# Patient Record
Sex: Male | Born: 1950 | Race: White | Hispanic: No | Marital: Married | Smoking: Current every day smoker
Health system: Southern US, Community
[De-identification: ages and names within clinical notes are randomized; demographics above are authoritative.]

## PROBLEM LIST (undated history)

## (undated) DIAGNOSIS — J449 Chronic obstructive pulmonary disease, unspecified: Secondary | ICD-10-CM

## (undated) DIAGNOSIS — I517 Cardiomegaly: Secondary | ICD-10-CM

## (undated) DIAGNOSIS — I10 Essential (primary) hypertension: Secondary | ICD-10-CM

## (undated) HISTORY — PX: EYE SURGERY: SHX253

---

## 2014-01-25 ENCOUNTER — Ambulatory Visit: Payer: Self-pay

## 2014-02-28 ENCOUNTER — Ambulatory Visit: Payer: Self-pay | Admitting: Ophthalmology

## 2014-04-04 ENCOUNTER — Ambulatory Visit: Payer: Self-pay | Admitting: Ophthalmology

## 2017-07-05 ENCOUNTER — Other Ambulatory Visit: Payer: Self-pay

## 2017-07-05 ENCOUNTER — Inpatient Hospital Stay: Payer: Medicare Other

## 2017-07-05 ENCOUNTER — Inpatient Hospital Stay
Admission: AD | Admit: 2017-07-05 | Discharge: 2017-07-07 | DRG: 683 | Disposition: A | Discharge status: Home / Self Care | Payer: Medicare Other | Source: Ambulatory Visit | Attending: Internal Medicine | Admitting: Internal Medicine

## 2017-07-05 DIAGNOSIS — I119 Hypertensive heart disease without heart failure: Secondary | ICD-10-CM | POA: Diagnosis present

## 2017-07-05 DIAGNOSIS — R338 Other retention of urine: Secondary | ICD-10-CM | POA: Diagnosis present

## 2017-07-05 DIAGNOSIS — N179 Acute kidney failure, unspecified: Principal | ICD-10-CM | POA: Diagnosis present

## 2017-07-05 DIAGNOSIS — Z79899 Other long term (current) drug therapy: Secondary | ICD-10-CM

## 2017-07-05 DIAGNOSIS — N401 Enlarged prostate with lower urinary tract symptoms: Secondary | ICD-10-CM | POA: Diagnosis present

## 2017-07-05 DIAGNOSIS — J441 Chronic obstructive pulmonary disease with (acute) exacerbation: Secondary | ICD-10-CM | POA: Diagnosis present

## 2017-07-05 DIAGNOSIS — F17210 Nicotine dependence, cigarettes, uncomplicated: Secondary | ICD-10-CM | POA: Diagnosis present

## 2017-07-05 HISTORY — DX: Cardiomegaly: I51.7

## 2017-07-05 HISTORY — DX: Essential (primary) hypertension: I10

## 2017-07-05 HISTORY — DX: Chronic obstructive pulmonary disease, unspecified: J44.9

## 2017-07-05 LAB — COMPREHENSIVE METABOLIC PANEL
ALK PHOS: 83 U/L (ref 38–126)
ALT: 17 U/L (ref 17–63)
ANION GAP: 13 (ref 5–15)
AST: 23 U/L (ref 15–41)
Albumin: 4 g/dL (ref 3.5–5.0)
BILIRUBIN TOTAL: 0.6 mg/dL (ref 0.3–1.2)
BUN: 62 mg/dL — ABNORMAL HIGH (ref 6–20)
CALCIUM: 8.8 mg/dL — AB (ref 8.9–10.3)
CO2: 18 mmol/L — ABNORMAL LOW (ref 22–32)
Chloride: 108 mmol/L (ref 101–111)
Creatinine, Ser: 4.74 mg/dL — ABNORMAL HIGH (ref 0.61–1.24)
GFR, EST AFRICAN AMERICAN: 13 mL/min — AB (ref >60)
GFR, EST NON AFRICAN AMERICAN: 11 mL/min — AB (ref >60)
Glucose, Bld: 129 mg/dL — ABNORMAL HIGH (ref 65–99)
POTASSIUM: 4.6 mmol/L (ref 3.5–5.1)
Sodium: 139 mmol/L (ref 135–145)
TOTAL PROTEIN: 8 g/dL (ref 6.5–8.1)

## 2017-07-05 LAB — CBC WITH DIFFERENTIAL/PLATELET
Basophils Absolute: 0 10*3/uL (ref 0–0.1)
Basophils Relative: 0 %
Eosinophils Absolute: 0 10*3/uL (ref 0–0.7)
Eosinophils Relative: 0 %
HEMATOCRIT: 36.2 % — AB (ref 40.0–52.0)
HEMOGLOBIN: 11.2 g/dL — AB (ref 13.0–18.0)
LYMPHS ABS: 0.4 10*3/uL — AB (ref 1.0–3.6)
Lymphocytes Relative: 4 %
MCH: 20.1 pg — AB (ref 26.0–34.0)
MCHC: 31 g/dL — AB (ref 32.0–36.0)
MCV: 65 fL — ABNORMAL LOW (ref 80.0–100.0)
MONO ABS: 0.3 10*3/uL (ref 0.2–1.0)
MONOS PCT: 3 %
NEUTROS ABS: 10.5 10*3/uL — AB (ref 1.4–6.5)
NEUTROS PCT: 93 %
Platelets: 316 10*3/uL (ref 150–440)
RBC: 5.56 MIL/uL (ref 4.40–5.90)
RDW: 22.3 % — AB (ref 11.5–14.5)
WBC: 11.3 10*3/uL — ABNORMAL HIGH (ref 3.8–10.6)

## 2017-07-05 LAB — INFLUENZA PANEL BY PCR (TYPE A & B)
INFLAPCR: NEGATIVE
Influenza B By PCR: NEGATIVE

## 2017-07-05 MED ORDER — ORAL CARE MOUTH RINSE: 15.0000 mL | Freq: Two times a day (BID) | OROMUCOSAL | Status: DC

## 2017-07-05 MED ORDER — NICOTINE 21 MG/24HR TD PT24
21.0000 mg | MEDICATED_PATCH | Freq: Every day | TRANSDERMAL | Status: DC
  Administered 2017-07-05 – 2017-07-07 (×3): 21 mg via TRANSDERMAL
  Filled 2017-07-05 (×3): qty 1

## 2017-07-05 MED ORDER — ORAL CARE MOUTH RINSE
15.0000 mL | Freq: Two times a day (BID) | OROMUCOSAL | Status: DC
  Administered 2017-07-05 – 2017-07-06 (×2): 15 mL via OROMUCOSAL

## 2017-07-05 MED ORDER — ACETAMINOPHEN 325 MG PO TABS: 650.0000 mg | ORAL_TABLET | Freq: Four times a day (QID) | ORAL | Status: DC | PRN

## 2017-07-05 MED ORDER — SODIUM CHLORIDE 0.9% FLUSH: 3.0000 mL | INTRAVENOUS | Status: DC | PRN

## 2017-07-05 MED ORDER — IPRATROPIUM-ALBUTEROL 0.5-2.5 (3) MG/3ML IN SOLN
3.0000 mL | RESPIRATORY_TRACT | Status: DC
  Administered 2017-07-05 – 2017-07-07 (×9): 3 mL via RESPIRATORY_TRACT
  Filled 2017-07-05 (×11): qty 3

## 2017-07-05 MED ORDER — BUDESONIDE 0.25 MG/2ML IN SUSP
0.2500 mg | Freq: Two times a day (BID) | RESPIRATORY_TRACT | Status: DC
  Administered 2017-07-05 – 2017-07-06 (×2): 0.25 mg via RESPIRATORY_TRACT
  Filled 2017-07-05 (×4): qty 2

## 2017-07-05 MED ORDER — SODIUM CHLORIDE 0.9% FLUSH
3.0000 mL | Freq: Two times a day (BID) | INTRAVENOUS | Status: DC
  Administered 2017-07-05 – 2017-07-07 (×4): 3 mL via INTRAVENOUS

## 2017-07-05 MED ORDER — TIOTROPIUM BROMIDE MONOHYDRATE 18 MCG IN CAPS
18.0000 ug | ORAL_CAPSULE | Freq: Every day | RESPIRATORY_TRACT | Status: DC
  Administered 2017-07-06: 18 ug via RESPIRATORY_TRACT
  Filled 2017-07-05: qty 5

## 2017-07-05 MED ORDER — GUAIFENESIN ER 600 MG PO TB12
600.0000 mg | ORAL_TABLET | Freq: Two times a day (BID) | ORAL | Status: DC
  Administered 2017-07-05 – 2017-07-07 (×4): 600 mg via ORAL
  Filled 2017-07-05 (×4): qty 1

## 2017-07-05 MED ORDER — HEPARIN SODIUM (PORCINE) 5000 UNIT/ML IJ SOLN
5000.0000 [IU] | Freq: Three times a day (TID) | INTRAMUSCULAR | Status: DC
  Administered 2017-07-05 – 2017-07-07 (×5): 5000 [IU] via SUBCUTANEOUS
  Filled 2017-07-05 (×5): qty 1

## 2017-07-05 MED ORDER — SODIUM CHLORIDE 0.9 % IV SOLN: 250.0000 mL | INTRAVENOUS | Status: DC | PRN

## 2017-07-05 MED ORDER — ACETAMINOPHEN 650 MG RE SUPP: 650.0000 mg | Freq: Four times a day (QID) | RECTAL | Status: DC | PRN

## 2017-07-05 MED ORDER — METHYLPREDNISOLONE SODIUM SUCC 125 MG IJ SOLR
60.0000 mg | Freq: Four times a day (QID) | INTRAMUSCULAR | Status: DC
  Administered 2017-07-05 – 2017-07-07 (×7): 60 mg via INTRAVENOUS
  Filled 2017-07-05 (×7): qty 2

## 2017-07-05 MED ORDER — TAMSULOSIN HCL 0.4 MG PO CAPS
0.4000 mg | ORAL_CAPSULE | Freq: Every day | ORAL | Status: DC
  Administered 2017-07-06 – 2017-07-07 (×2): 0.4 mg via ORAL
  Filled 2017-07-05 (×2): qty 1

## 2017-07-05 NOTE — H&P (Signed)
[place] [facility] [place] NAME: Christopher Mendez    MR#:  [id number]  DATE OF BIRTH:  1950/10/06  DATE OF ADMISSION:  07/05/2017  PRIMARY CARE PHYSICIAN: [facility] [place]   REQUESTING/REFERRING PHYSICIAN: [facility]  CHIEF COMPLAINT:  No chief complaint on file.   HISTORY OF PRESENT ILLNESS: Christopher Mendez  is a 67 y.o. male with a known history of COPD, essential hypertension who has had trouble with voiding for the past 4 weeks.  According to his wife he has been having to use diapers at nighttime with leakage.  Patient was seen at [facility] and had blood work done which showed that his creatinine is significantly elevated.  And is noted to have acute renal failure.  Therefore his referred to admission.  Patient also complains of abdominal distention and lower extremity swelling. He also has a history of COPD and has been having progressive shortness of breath wheezing coughing.  Denies any fevers or chills.  PAST MEDICAL HISTORY:   Past Medical History:  Diagnosis Date  . Cardiac enlargement   . COPD (chronic obstructive pulmonary disease) ([place])   . HTN (hypertension)     PAST SURGICAL HISTORY: Reviewed denies any surgical history  SOCIAL HISTORY:  Social History   Tobacco Use  . Smoking status: Current Every Day Smoker    Packs/day: 1.00    Years: 56.00    Pack years: 56.00    Types: Cigarettes  . Smokeless tobacco: Never Used  . Tobacco comment: smoking since he was 10  Substance Use Topics  . Alcohol use: Yes    Alcohol/week: 25.2 oz    Types: 42 Cans of beer per week    Frequency: Never    Comment:  stopped drinking 5 weeks ago    FAMILY HISTORY:  Family History  Problem Relation Age of Onset  . Hypertension Mother   . Diabetes Father     DRUG ALLERGIES: Allergies not on file  REVIEW OF SYSTEMS:   CONSTITUTIONAL: No fever, fatigue or weakness.  EYES: No blurred or double  vision.  EARS, NOSE, AND THROAT: No tinnitus or ear pain.  RESPIRATORY: No cough, shortness of breath, wheezing or hemoptysis.  CARDIOVASCULAR: No chest pain, orthopnea, positive edema.  GASTROINTESTINAL: No nausea, vomiting, diarrhea or positive abdominal distention GENITOURINARY: No dysuria, hematuria.  ENDOCRINE: No polyuria, nocturia,  HEMATOLOGY: No anemia, easy bruising or bleeding SKIN: No rash or lesion. MUSCULOSKELETAL: No joint pain or arthritis.   NEUROLOGIC: No tingling, numbness, weakness.  PSYCHIATRY: No anxiety or depression.   MEDICATIONS AT HOME:  Prior to Admission medications   Medication Sig Start Date End Date Taking? Authorizing Provider  albuterol (PROVENTIL HFA;VENTOLIN HFA) 108 (90 Base) MCG/ACT inhaler Inhale 1 puff into the lungs every 6 (six) hours as needed for wheezing or shortness of breath.   Yes [provider]  azithromycin (ZITHROMAX) 250 MG tablet Take 250 mg by mouth daily. For 5 days 07/02/17  Yes [provider]  Fluticasone-Salmeterol (ADVAIR) 500-50 MCG/DOSE AEPB Inhale 1 puff into the lungs 2 (two) times daily.   Yes [provider]  hydrochlorothiazide (HYDRODIURIL) 25 MG tablet Take 25 mg by mouth daily.   Yes [provider]  lisinopril (PRINIVIL,ZESTRIL) 40 MG tablet Take 40 mg by mouth daily.   Yes [provider]  predniSONE (DELTASONE) 10 MG tablet Take 40 mg by mouth daily with breakfast. For 5 days 07/02/17  Yes [provider]  tamsulosin (FLOMAX) 0.4 MG CAPS capsule Take 0.4 mg by mouth daily.   Yes [provider]  tiotropium (SPIRIVA) 18 MCG inhalation capsule Place 18 mcg into inhaler and inhale daily.   Yes [provider]  varenicline (CHANTIX PAK) 0.5 MG X 11 & 1 MG X 42 tablet Take 1 mg by mouth 2 (two) times daily. Take one 0.5 mg tablet by mouth once daily for 3 days, then increase to one 0.5 mg tablet twice daily for 4 days, then increase to one 1 mg tablet twice  daily.   Yes [provider]      PHYSICAL EXAMINATION:   VITAL SIGNS: Blood pressure (!) 146/68, pulse 97, temperature 97.9 F (36.6 C), resp. rate 18, height 6' (1.829 m), weight 247 lb 2.2 oz (112.1 kg), SpO2 93 %.  GENERAL:  67 y.o.-year-old patient lying in the bed with no acute distress.  EYES: Pupils equal, round, reactive to light and accommodation. No scleral icterus. Extraocular muscles intact.  HEENT: Head atraumatic, normocephalic. Oropharynx and nasopharynx clear.  NECK:  Supple, no jugular venous distention. No thyroid enlargement, no tenderness.  LUNGS: Bilateral wheezing throughout the lung as well as crackles CARDIOVASCULAR: S1, S2 normal. No murmurs, rubs, or gallops.  ABDOMEN: Soft, nontender, positive the distended her abdomen. Bowel sounds present. No organomegaly or mass.  EXTREMITIES: 2+ pedal edema, cyanosis, or clubbing.  NEUROLOGIC: Cranial nerves II through XII are intact. Muscle strength 5/5 in all extremities. Sensation intact. Gait not checked.  PSYCHIATRIC: The patient is alert and oriented x 3.  SKIN: No obvious rash, lesion, or ulcer.   LABORATORY PANEL:   CBC Recent Labs  Lab 07/05/17 1332  WBC 11.3*  HGB 11.2*  HCT 36.2*  PLT 316  MCV 65.0*  MCH 20.1*  MCHC 31.0*  RDW 22.3*  LYMPHSABS 0.4*  MONOABS 0.3  EOSABS 0.0  BASOSABS 0.0   ------------------------------------------------------------------------------------------------------------------  Chemistries  Recent Labs  Lab 07/05/17 1332  NA 139  K 4.6  CL 108  CO2 18*  GLUCOSE 129*  BUN 62*  CREATININE 4.74*  CALCIUM 8.8*  AST 23  ALT 17  ALKPHOS 83  BILITOT 0.6   ------------------------------------------------------------------------------------------------------------------ estimated creatinine clearance is 19.3 mL/min (A) (by C-G formula based on SCr of 4.74 mg/dL  (H)). ------------------------------------------------------------------------------------------------------------------ No results for input(s): TSH, T4TOTAL, T3FREE, THYROIDAB in the last 72 hours.  Invalid input(s): FREET3   Coagulation profile No results for input(s): INR, PROTIME in the last 168 hours. ------------------------------------------------------------------------------------------------------------------- No results for input(s): DDIMER in the last 72 hours. -------------------------------------------------------------------------------------------------------------------  Cardiac Enzymes No results for input(s): CKMB, TROPONINI, MYOGLOBIN in the last 168 hours.  Invalid input(s): CK ------------------------------------------------------------------------------------------------------------------ Invalid input(s): POCBNP  ---------------------------------------------------------------------------------------------------------------  Urinalysis No results found for: COLORURINE, APPEARANCEUR, LABSPEC, PHURINE, GLUCOSEU, HGBUR, BILIRUBINUR, KETONESUR, PROTEINUR, UROBILINOGEN, NITRITE, LEUKOCYTESUR   RADIOLOGY: Dg Chest 2 View  Result Date: 07/05/2017 CLINICAL DATA:  Weakness EXAM: CHEST  2 VIEW COMPARISON:  01/25/2014 FINDINGS: Cardiac shadow is enlarged. Mild vascular congestion is noted without significant interstitial edema. No focal infiltrate or sizable effusion is noted. Hyperinflation consistent with COPD is seen. IMPRESSION: Mild vascular congestion COPD. Electronically Signed   By: Inez Catalina M.D.   On: 07/05/2017 14:52   Dg Abd 1 View  Result Date: 07/05/2017 CLINICAL DATA:  Weakness EXAM: ABDOMEN - 1 VIEW COMPARISON:  None. FINDINGS: Scattered large and small bowel gas is noted. No obstructive changes are seen. Fullness in the bladder is again identified. No acute bony abnormality  is seen. Degenerative changes of lumbar spine are noted. IMPRESSION: Distended  bladder. No other focal abnormality is noted. Electronically Signed   By: Inez Catalina M.D.   On: 07/05/2017 14:55   US Renal  Result Date: 07/05/2017 CLINICAL DATA:  Acute renal failure EXAM: RENAL / URINARY TRACT ULTRASOUND COMPLETE COMPARISON:  None. FINDINGS: Right Kidney: Length: 13 cm. Moderate hydronephrosis is noted. 3.4 cm cyst is noted in the upper pole. Left Kidney: Length: 11.8 cm. Moderate hydronephrosis is noted similar to that seen on the right. Bladder: Bladder is well distended. Prostate is mildly prominent. Prevoid bladder volume was 1315 mL and the postvoid volume is 1276 mL IMPRESSION: Moderate hydronephrosis bilaterally. This may be related to significantly distended bladder. Voiding was attempted and only a minimal amount of urine was passed. This raises suspicion for chronic outlet obstruction. Electronically Signed   By: Inez Catalina M.D.   On: 07/05/2017 14:30    EKG: No orders found for this or any previous visit.  IMPRESSION AND PLAN: Patient is a 67 year old white male sent from primary care's office for acute renal  1.  Acute renal failure due to urinary retention likely result of enlarged prostate I will Place Foley Patient had a PSA that was elevated as outpatient I will ask urology to see Nephrology evaluation as well  2.  Acute on chronic COPD exasperation We will treat him with nebulizer steroids Check the flu test Continue Spiriva Start him on Symbicort nebs  3.  Essential hypertension due to acute renal failure will hold his HCTZ and lisinopril  4.  Nicotine abuse smoking cessation provided 4 minutes spent strongly recommend he stop smoking nicotine patch will be started   All the records are reviewed and case discussed with ED provider. Management plans discussed with the patient, family and they are in agreement.  CODE STATUS:    Code Status Orders  (From admission, onward)        Start     Ordered   07/05/17 1340  Full code  Continuous      07/05/17 1340    Code Status History    Date Active Date Inactive Code Status Order ID Comments User Context   This patient has a current code status but no historical code status.       TOTAL TIME TAKING CARE OF THIS PATIENT: 49minutes.    Dustin Flock M.D on 07/05/2017 at 4:22 PM  Between 7am to 6pm - Pager - [phone]  After 6pm go to [email or web address] - password [email or web address] [facility]  [place] [facility]  Office  [phone]  CC: Primary care physician; [facility]

## 2017-07-05 NOTE — Consult Note (Signed)
Urology Consult  Consulting OE:[id number] Posey Pronto  CC: Renal failure, urinary retention  HPI: This is a 67year old male without significant urologic history admitted earlier today with acute renal insufficiency.  The patient was found to have bilateral hydronephrosis on ultrasound.  Additionally, he was found to have a significantly distended bladder large residual volume.  The patient does note a long-standing history of urinary difficulty frequency, urgency, slow stream, incomplete emptying, urinary incontinence necessitating diapers for several months.  He has not had urinary tract infections.  He has never had his prostate checked that he knows of.  There is no family history of prostate cancer.  Creatinine on admission was 4.7, bicarbonate 18, BUN 62, potassium 4.6.  Foley catheter placed.    PMH: Past Medical History:  Diagnosis Date  . Cardiac enlargement   . COPD (chronic obstructive pulmonary disease) ([place])   . HTN (hypertension)     PSH:   Allergies: No Known Allergies  Medications: Medications Prior to Admission  Medication Sig Dispense Refill Last Dose  . albuterol (PROVENTIL HFA;VENTOLIN HFA) 108 (90 Base) MCG/ACT inhaler Inhale 1 puff into the lungs every 6 (six) hours as needed for wheezing or shortness of breath.   07/05/2017 at Unknown time  . azithromycin (ZITHROMAX) 250 MG tablet Take 250 mg by mouth daily. For 5 days   07/05/2017 at Unknown time  . Fluticasone-Salmeterol (ADVAIR) 500-50 MCG/DOSE AEPB Inhale 1 puff into the lungs 2 (two) times daily.   07/05/2017 at Unknown time  . hydrochlorothiazide (HYDRODIURIL) 25 MG tablet Take 25 mg by mouth daily.   07/05/2017 at Unknown time  . lisinopril (PRINIVIL,ZESTRIL) 40 MG tablet Take 40 mg by mouth daily.   07/05/2017 at Unknown time  . predniSONE (DELTASONE) 10 MG tablet Take 40 mg by mouth daily with breakfast. For 5 days   07/05/2017 at Unknown time  . tamsulosin (FLOMAX) 0.4 MG CAPS capsule Take 0.4 mg by mouth daily.    07/05/2017 at Unknown time  . tiotropium (SPIRIVA) 18 MCG inhalation capsule Place 18 mcg into inhaler and inhale daily.   07/05/2017 at Unknown time     Social History: Social History   Socioeconomic History  . Marital status: Married    Spouse name: Not on file  . Number of children: Not on file  . Years of education: Not on file  . Highest education level: Not on file  Social Needs  . Financial resource strain: Not on file  . Food insecurity - worry: Not on file  . Food insecurity - inability: Not on file  . Transportation needs - medical: Not on file  . Transportation needs - non-medical: Not on file  Occupational History  . Not on file  Tobacco Use  . Smoking status: Current Every Day Smoker    Packs/day: 1.00    Years: 56.00    Pack years: 56.00    Types: Cigarettes  . Smokeless tobacco: Never Used  . Tobacco comment: smoking since he was 10  Substance and Sexual Activity  . Alcohol use: Yes    Alcohol/week: 25.2 oz    Types: 42 Cans of beer per week    Frequency: Never    Comment:  stopped drinking 5 weeks ago  . Drug use: No  . Sexual activity: Not Currently  Other Topics Concern  . Not on file  Social History Narrative  . Not on file    Family History: Family History  Problem Relation Age of Onset  . Hypertension Mother   .  Diabetes Father     Review of Systems: Positive: Urinary frequency, urgency, incontinence, slow stream, hesitancy, intermittency, incomplete emptying, edema Negative:   A further 10 point review of systems was negative except what is listed in the HPI.  Physical Exam: @VITALS2 @ General: No acute distress.  Awake. Head:  Normocephalic.  Atraumatic. ENT:  EOMI.  Mucous membranes moist Neck:  Supple.  No lymphadenopathy. CV:  S1 present. S2 present. Regular rate. Pulmonary: Equal effort bilaterally.  Clear to auscultation bilaterally. Abdomen: Soft, obese none.  Non tender to palpation. Skin:  Normal turgor.  No visible  rash. Extremity: No gross deformity of bilateral upper extremities.  No gross deformity of is bilateral lower extremities. Neurologic: Alert. Appropriate mood.  Penis:  No lesions. Urethra:  Foley catheter in place.  Orthotopic meatus. Scrotum: No lesions.  No ecchymosis.  No erythema. Testicles: Descended bilaterally.  No masses bilaterally. Epididymis: Palpable bilaterally.  Non Tender to palpation. Rectal:             Somewhat difficult due to patient positioning, but prostate is very large, a bit asymmetric and to me, firm.  Studies:  Recent Labs    07/05/17 1332  HGB 11.2*  WBC 11.3*  PLT 316    Recent Labs    07/05/17 1332  NA 139  K 4.6  CL 108  CO2 18*  BUN 62*  CREATININE 4.74*  CALCIUM 8.8*  GFRNONAA 11*  GFRAA 13*     No results for input(s): INR, APTT in the last 72 hours.  Invalid input(s): PT   Invalid input(s): ABG  Renal ultrasound images were independently reviewed by me.  There is a solitary renal cyst, simple in nature.  Mild to moderate bilateral hydroureter process.  Large bladder volume  Assessment: 1.  Bladder outlet obstruction with significant symptomatology, currently treated with Foley catheter placement 2.  Acute renal failure/bilateral hydronephrosis, most likely related to problem #1 3.  Large, asymmetric, prostate exam. Plan: 1.  I will start the patient on tamsulosin eventual voiding trial as an outpatient 2.  The patient will eventually need PSA/repeat digital rectal exam as an outpatient.  PSA while in the hospital, as he has had recent Foley catheter placement and urinary retention, both   which can cause elevated PSA 3. We will arrange post d/c followup for above    Pager:[phone]

## 2017-07-05 NOTE — Progress Notes (Signed)
Chaplain spoke to the patient's wife and provided AD education. Due to the patient's visitor precautions it may not be possible to complete the AD at this time.

## 2017-07-05 NOTE — Progress Notes (Signed)
Dr. Posey Pronto gave order to bladder scan patient and if there was more than 350cc to insert foley.  I did the bladder scan which was >999.  We inserted the foley and got out 1400cc.

## 2017-07-06 ENCOUNTER — Telehealth: Payer: Self-pay | Admitting: Urology

## 2017-07-06 LAB — BASIC METABOLIC PANEL
ANION GAP: 9 (ref 5–15)
BUN: 44 mg/dL — ABNORMAL HIGH (ref 6–20)
CHLORIDE: 109 mmol/L (ref 101–111)
CO2: 22 mmol/L (ref 22–32)
Calcium: 9.1 mg/dL (ref 8.9–10.3)
Creatinine, Ser: 1.86 mg/dL — ABNORMAL HIGH (ref 0.61–1.24)
GFR calc non Af Amer: 35 mL/min — ABNORMAL LOW (ref >60)
GFR, EST AFRICAN AMERICAN: 41 mL/min — AB (ref >60)
Glucose, Bld: 158 mg/dL — ABNORMAL HIGH (ref 65–99)
Potassium: 4.2 mmol/L (ref 3.5–5.1)
SODIUM: 140 mmol/L (ref 135–145)

## 2017-07-06 LAB — CBC
HEMATOCRIT: 35.9 % — AB (ref 40.0–52.0)
HEMOGLOBIN: 11.2 g/dL — AB (ref 13.0–18.0)
MCH: 20.3 pg — ABNORMAL LOW (ref 26.0–34.0)
MCHC: 31.2 g/dL — ABNORMAL LOW (ref 32.0–36.0)
MCV: 65.1 fL — ABNORMAL LOW (ref 80.0–100.0)
Platelets: 326 10*3/uL (ref 150–440)
RBC: 5.51 MIL/uL (ref 4.40–5.90)
RDW: 21.9 % — ABNORMAL HIGH (ref 11.5–14.5)
WBC: 5.2 10*3/uL (ref 3.8–10.6)

## 2017-07-06 MED ORDER — SODIUM CHLORIDE 0.9 % IV SOLN
INTRAVENOUS | Status: DC
  Administered 2017-07-06 – 2017-07-07 (×2): via INTRAVENOUS

## 2017-07-06 NOTE — Telephone Encounter (Signed)
App has been made ° ° °Michelle °

## 2017-07-06 NOTE — Consult Note (Signed)
[facility] CONSULT NOTE    Date: 07/06/2017                  Patient Name:  Shandell Jallow  MRN: [id number]  DOB: Oct 09, 1950  Age / Sex: 67 y.o., male         PCP: [facility] [place]                 Service Requesting Consult: Hospitalist                 Reason for Consult: Acute renal failure due to obstructive uropathy            History of Present Illness: Patient is a 67 y.o. male with a PMHx of COPD and hypertension, who was admitted to [facility] on 07/05/2017 for evaluation of significant dysuria and urinary retention.  Patient has been having progressive dysuria over the past several months.  He has not seen a provider for these symptoms previously.  Apparently patient had been using diapers at home with significant leakage.  He went to see his primary care provider subsequently and he was found to have acute renal failure and subsequent we sent here.  He had significant lower abdominal distention upon arrival.  Foley catheter was placed.  Patient had overt 5 L of urine output yesterday.  Creatinine is now down to1.86.  Patient is feeling much better after the Foley catheter placement.   Medications: Outpatient medications: Medications Prior to Admission  Medication Sig Dispense Refill Last Dose  . albuterol (PROVENTIL HFA;VENTOLIN HFA) 108 (90 Base) MCG/ACT inhaler Inhale 1 puff into the lungs every 6 (six) hours as needed for wheezing or shortness of breath.   07/05/2017 at Unknown time  . azithromycin (ZITHROMAX) 250 MG tablet Take 250 mg by mouth daily. For 5 days   07/05/2017 at Unknown time  . Fluticasone-Salmeterol (ADVAIR) 500-50 MCG/DOSE AEPB Inhale 1 puff into the lungs 2 (two) times daily.   07/05/2017 at Unknown time  . hydrochlorothiazide (HYDRODIURIL) 25 MG tablet Take 25 mg by mouth daily.   07/05/2017 at Unknown time  . lisinopril (PRINIVIL,ZESTRIL) 40 MG tablet Take 40 mg by mouth daily.   07/05/2017 at Unknown time  . predniSONE  (DELTASONE) 10 MG tablet Take 40 mg by mouth daily with breakfast. For 5 days   07/05/2017 at Unknown time  . tamsulosin (FLOMAX) 0.4 MG CAPS capsule Take 0.4 mg by mouth daily.   07/05/2017 at Unknown time  . tiotropium (SPIRIVA) 18 MCG inhalation capsule Place 18 mcg into inhaler and inhale daily.   07/05/2017 at Unknown time    Current medications: Current Facility-Administered Medications  Medication Dose Route Frequency Provider Last Rate Last Dose  . 0.9 %  sodium chloride infusion  250 mL Intravenous PRN Dustin Flock, MD      . acetaminophen (TYLENOL) tablet 650 mg  650 mg Oral Q6H PRN Dustin Flock, MD       Or  . acetaminophen (TYLENOL) suppository 650 mg  650 mg Rectal Q6H PRN Dustin Flock, MD      . budesonide (PULMICORT) nebulizer solution 0.25 mg  0.25 mg Nebulization BID Dustin Flock, MD   0.25 mg at 07/05/17 2005  . guaiFENesin (MUCINEX) 12 hr tablet 600 mg  600 mg Oral BID Dustin Flock, MD   600 mg at 07/06/17 0840  . heparin injection 5,000 Units  5,000 Units Subcutaneous Q8H Dustin Flock, MD   5,000 Units at 07/06/17 [phone]  .  ipratropium-albuterol (DUONEB) 0.5-2.5 (3) MG/3ML nebulizer solution 3 mL  3 mL Nebulization Q4H Dustin Flock, MD   3 mL at 07/06/17 1113  . MEDLINE mouth rinse  15 mL Mouth Rinse BID Henreitta Leber, MD   15 mL at 07/06/17 0930  . methylPREDNISolone sodium succinate (SOLU-MEDROL) 125 mg/2 mL injection 60 mg  60 mg Intravenous Q6H Dustin Flock, MD   60 mg at 07/06/17 1127  . nicotine (NICODERM CQ - dosed in mg/24 hours) patch 21 mg  21 mg Transdermal Daily Dustin Flock, MD   21 mg at 07/06/17 0842  . sodium chloride flush (NS) 0.9 % injection 3 mL  3 mL Intravenous Q12H Dustin Flock, MD   3 mL at 07/06/17 0846  . sodium chloride flush (NS) 0.9 % injection 3 mL  3 mL Intravenous PRN Dustin Flock, MD      . tamsulosin [facility]) capsule 0.4 mg  0.4 mg Oral Daily Dustin Flock, MD   0.4 mg at 07/06/17 6063      Allergies: No  Known Allergies    Past Medical History: Past Medical History:  Diagnosis Date  . Cardiac enlargement   . COPD (chronic obstructive pulmonary disease) ([place])   . HTN (hypertension)      Past Surgical History: None  Family History: Family History  Problem Relation Age of Onset  . Hypertension Mother   . Diabetes Father      Social History: Social History   Socioeconomic History  . Marital status: Married    Spouse name: Not on file  . Number of children: Not on file  . Years of education: Not on file  . Highest education level: Not on file  Social Needs  . Financial resource strain: Not on file  . Food insecurity - worry: Not on file  . Food insecurity - inability: Not on file  . Transportation needs - medical: Not on file  . Transportation needs - non-medical: Not on file  Occupational History  . Not on file  Tobacco Use  . Smoking status: Current Every Day Smoker    Packs/day: 1.00    Years: 56.00    Pack years: 56.00    Types: Cigarettes  . Smokeless tobacco: Never Used  . Tobacco comment: smoking since he was 10  Substance and Sexual Activity  . Alcohol use: Yes    Alcohol/week: 25.2 oz    Types: 42 Cans of beer per week    Frequency: Never    Comment:  stopped drinking 5 weeks ago  . Drug use: No  . Sexual activity: Not Currently  Other Topics Concern  . Not on file  Social History Narrative  . Not on file     Review of Systems: Review of Systems  Constitutional: Positive for malaise/fatigue. Negative for chills and fever.  HENT: Negative for congestion, hearing loss and nosebleeds.   Eyes: Negative for blurred vision and double vision.  Respiratory: Negative for cough, hemoptysis and sputum production.   Cardiovascular: Negative for chest pain, palpitations and orthopnea.  Gastrointestinal: Positive for abdominal pain. Negative for heartburn and vomiting.  Genitourinary: Positive for dysuria and flank pain.  Musculoskeletal: Negative for  back pain and myalgias.  Skin: Negative for itching and rash.  Neurological: Negative for dizziness and focal weakness.  Endo/Heme/Allergies: Negative for polydipsia. Does not bruise/bleed easily.  Psychiatric/Behavioral: Negative for depression and hallucinations.     Vital Signs: Blood pressure (!) 150/64, pulse 83, temperature (!) 97.4 F (36.3 C), temperature source  Oral, resp. rate 20, height 6' (1.829 m), weight 112.1 kg (247 lb 2.2 oz), SpO2 92 %.  Weight trends: Filed Weights   07/05/17 1206  Weight: 112.1 kg (247 lb 2.2 oz)    Physical Exam: General: NAD, resting in bed  Head: Normocephalic, atraumatic.  Eyes: Anicteric, EOMI  Nose: Mucous membranes moist, not inflammed, nonerythematous.  Throat: Oropharynx nonerythematous, no exudate appreciated.   Neck: Supple, trachea midline.  Lungs:  Normal respiratory effort. Clear to auscultation BL without crackles or wheezes.  Heart: RRR. S1 and S2 normal without gallop, murmur, or rubs.  Abdomen:  BS normoactive. Soft, Nondistended, non-tender.  No masses or organomegaly.  Extremities: No pretibial edema.  Neurologic: A&O X3, Motor strength is 5/5 in the all 4 extremities  Skin: No visible rashes, scars.    Lab results: Basic Metabolic Panel: Recent Labs  Lab 07/05/17 1332 07/06/17 0544  NA 139 140  K 4.6 4.2  CL 108 109  CO2 18* 22  GLUCOSE 129* 158*  BUN 62* 44*  CREATININE 4.74* 1.86*  CALCIUM 8.8* 9.1    Liver Function Tests: Recent Labs  Lab 07/05/17 1332  AST 23  ALT 17  ALKPHOS 83  BILITOT 0.6  PROT 8.0  ALBUMIN 4.0   No results for input(s): LIPASE, AMYLASE in the last 168 hours. No results for input(s): AMMONIA in the last 168 hours.  CBC: Recent Labs  Lab 07/05/17 1332 07/06/17 0544  WBC 11.3* 5.2  NEUTROABS 10.5*  --   HGB 11.2* 11.2*  HCT 36.2* 35.9*  MCV 65.0* 65.1*  PLT 316 326    Cardiac Enzymes: No results for input(s): CKTOTAL, CKMB, CKMBINDEX, TROPONINI in the last 168  hours.  BNP: Invalid input(s): POCBNP  CBG: No results for input(s): GLUCAP in the last 168 hours.  Microbiology: No results found for this or any previous visit.  Coagulation Studies: No results for input(s): LABPROT, INR in the last 72 hours.  Urinalysis: No results for input(s): COLORURINE, LABSPEC, PHURINE, GLUCOSEU, HGBUR, BILIRUBINUR, KETONESUR, PROTEINUR, UROBILINOGEN, NITRITE, LEUKOCYTESUR in the last 72 hours.  Invalid input(s): APPERANCEUR    Imaging: Dg Chest 2 View  Result Date: 07/05/2017 CLINICAL DATA:  Weakness EXAM: CHEST  2 VIEW COMPARISON:  01/25/2014 FINDINGS: Cardiac shadow is enlarged. Mild vascular congestion is noted without significant interstitial edema. No focal infiltrate or sizable effusion is noted. Hyperinflation consistent with COPD is seen. IMPRESSION: Mild vascular congestion COPD. Electronically Signed   By: Inez Catalina M.D.   On: 07/05/2017 14:52   Dg Abd 1 View  Result Date: 07/05/2017 CLINICAL DATA:  Weakness EXAM: ABDOMEN - 1 VIEW COMPARISON:  None. FINDINGS: Scattered large and small bowel gas is noted. No obstructive changes are seen. Fullness in the bladder is again identified. No acute bony abnormality is seen. Degenerative changes of lumbar spine are noted. IMPRESSION: Distended bladder. No other focal abnormality is noted. Electronically Signed   By: Inez Catalina M.D.   On: 07/05/2017 14:55   US Renal  Result Date: 07/05/2017 CLINICAL DATA:  Acute renal failure EXAM: RENAL / URINARY TRACT ULTRASOUND COMPLETE COMPARISON:  None. FINDINGS: Right Kidney: Length: 13 cm. Moderate hydronephrosis is noted. 3.4 cm cyst is noted in the upper pole. Left Kidney: Length: 11.8 cm. Moderate hydronephrosis is noted similar to that seen on the right. Bladder: Bladder is well distended. Prostate is mildly prominent. Prevoid bladder volume was 1315 mL and the postvoid volume is 1276 mL IMPRESSION: Moderate hydronephrosis bilaterally. This may be related  to  significantly distended bladder. Voiding was attempted and only a minimal amount of urine was passed. This raises suspicion for chronic outlet obstruction. Electronically Signed   By: Inez Catalina M.D.   On: 07/05/2017 14:30      Assessment & Plan: Pt is a 67 y.o. male  with a PMHx of COPD and hypertension, who was admitted to [facility] on 07/05/2017 for evaluation of significant dysuria and urinary retention.   1.  Acute renal failure due to bilateral hydronephrosis. 2.  Urinary retention due to bladder outlet obstruction. 3.  Hypertension.  Plan:  Patient presented with significant dysuria.  He was found to have bladder outlet obstruction.  After Foley catheter placement.  5 L of urine output.  His creatinine has significantly improved and is down to 1.86.  Given the high amount of diuresis we will start the patient on 0.9 normal saline at 100 cc per hour.  Continue to monitor renal parameters daily.  He will need additional followup through urology.  Keep Foley catheter in place at this time.

## 2017-07-06 NOTE — Telephone Encounter (Signed)
Would you see if this patient has outpatient follow up when he is discharged from the hospital for a voiding trial and will additional follow up after that for DRE/repeat PSA exam?

## 2017-07-06 NOTE — Progress Notes (Addendum)
[place] [facility] [place] NAME: Christopher Mendez    MR#:  [id number]  DATE OF BIRTH:  Oct 22, 1950  SUBJECTIVE:  CHIEF COMPLAINT:  No chief complaint on file.  -  Came in with urinary retention and renal failure. Foley has been placed. Renal Function is improving. -Still has significant wheezing and shortness of breath  REVIEW OF SYSTEMS:  Review of Systems  Constitutional: Positive for malaise/fatigue. Negative for chills and fever.  HENT: Negative for congestion, ear discharge, hearing loss and nosebleeds.   Eyes: Negative for blurred vision and double vision.  Respiratory: Positive for shortness of breath and wheezing. Negative for cough.   Cardiovascular: Negative for chest pain, palpitations and leg swelling.  Gastrointestinal: Negative for abdominal pain, constipation, diarrhea, nausea and vomiting.  Genitourinary: Negative for dysuria and urgency.  Musculoskeletal: Negative for myalgias.  Neurological: Negative for dizziness, speech change, focal weakness, seizures and headaches.  Psychiatric/Behavioral: Negative for depression.    DRUG ALLERGIES:  No Known Allergies  VITALS:  Blood pressure (!) 152/69, pulse 83, temperature 98.4 F (36.9 C), temperature source Oral, resp. rate 20, height 6' (1.829 m), weight 112.1 kg (247 lb 2.2 oz), SpO2 93 %.  PHYSICAL EXAMINATION:  Physical Exam   GENERAL:  67 y.o.-year-old patient lying in the bed with no acute distress.  EYES: Pupils equal, round, reactive to light and accommodation. No scleral icterus. Extraocular muscles intact.  HEENT: Head atraumatic, normocephalic. Oropharynx and nasopharynx clear.  NECK:  Supple, no jugular venous distention. No thyroid enlargement, no tenderness.  LUNGS:  Diffuse wheezing all over the lung fields, no crackles, rales or rhonchi. No use of accessory muscles to breathe CARDIOVASCULAR: S1, S2 normal. No murmurs, rubs, or gallops.  ABDOMEN: Soft,  nontender, nondistended. Bowel sounds present. No organomegaly or mass.  EXTREMITIES: No pedal edema, cyanosis, or clubbing.  NEUROLOGIC: Cranial nerves II through XII are intact. Muscle strength 5/5 in all extremities. Sensation intact. Gait not checked. Global weakness noted PSYCHIATRIC: The patient is alert and oriented x 3.  SKIN: No obvious rash, lesion, or ulcer.    LABORATORY PANEL:   CBC Recent Labs  Lab 07/06/17 0544  WBC 5.2  HGB 11.2*  HCT 35.9*  PLT 326   ------------------------------------------------------------------------------------------------------------------  Chemistries  Recent Labs  Lab 07/05/17 1332 07/06/17 0544  NA 139 140  K 4.6 4.2  CL 108 109  CO2 18* 22  GLUCOSE 129* 158*  BUN 62* 44*  CREATININE 4.74* 1.86*  CALCIUM 8.8* 9.1  AST 23  --   ALT 17  --   ALKPHOS 83  --   BILITOT 0.6  --    ------------------------------------------------------------------------------------------------------------------  Cardiac Enzymes No results for input(s): TROPONINI in the last 168 hours. ------------------------------------------------------------------------------------------------------------------  RADIOLOGY:  Dg Chest 2 View  Result Date: 07/05/2017 CLINICAL DATA:  Weakness EXAM: CHEST  2 VIEW COMPARISON:  01/25/2014 FINDINGS: Cardiac shadow is enlarged. Mild vascular congestion is noted without significant interstitial edema. No focal infiltrate or sizable effusion is noted. Hyperinflation consistent with COPD is seen. IMPRESSION: Mild vascular congestion COPD. Electronically Signed   By: Inez Catalina M.D.   On: 07/05/2017 14:52   Dg Abd 1 View  Result Date: 07/05/2017 CLINICAL DATA:  Weakness EXAM: ABDOMEN - 1 VIEW COMPARISON:  None. FINDINGS: Scattered large and small bowel gas is noted. No obstructive changes are seen. Fullness in the bladder is again identified. No acute bony abnormality is seen. Degenerative changes of lumbar spine are  noted. IMPRESSION: Distended bladder. No other focal abnormality is noted. Electronically Signed   By: Inez Catalina M.D.   On: 07/05/2017 14:55   US Renal  Result Date: 07/05/2017 CLINICAL DATA:  Acute renal failure EXAM: RENAL / URINARY TRACT ULTRASOUND COMPLETE COMPARISON:  None. FINDINGS: Right Kidney: Length: 13 cm. Moderate hydronephrosis is noted. 3.4 cm cyst is noted in the upper pole. Left Kidney: Length: 11.8 cm. Moderate hydronephrosis is noted similar to that seen on the right. Bladder: Bladder is well distended. Prostate is mildly prominent. Prevoid bladder volume was 1315 mL and the postvoid volume is 1276 mL IMPRESSION: Moderate hydronephrosis bilaterally. This may be related to significantly distended bladder. Voiding was attempted and only a minimal amount of urine was passed. This raises suspicion for chronic outlet obstruction. Electronically Signed   By: Inez Catalina M.D.   On: 07/05/2017 14:30    EKG:  No orders found for this or any previous visit.  ASSESSMENT AND PLAN:   67 year old male with past medical history significant for COPD not on a moccasin, hypertension, ongoing smoking presents to hospital secondary to weakness, difficulty breathing and urinary retention.  1. Acute renal failure- secondary to obstructive uropathy from enlarged prostrate - Foley catheter inserted- relieved obstruction - appreciate urology consult - started on flomax - discharge with foley and outpatient voiding trial, prostrate biopsy etc - renal function improving  2. Acute on chronic COPD exacerbation- on room air, wheezing significantly, continue IV steroids -duonebs, flu negative - continue inhalers  3. Tobacco use disorder- nicotine patch  4. DVT Prophylaxis- heparin sq  PT consult for weakness    All the records are reviewed and case discussed with Care Management/Social Workerr. Management plans discussed with the patient, family and they are in agreement.  CODE STATUS:  Full code  TOTAL TIME TAKING CARE OF THIS PATIENT: 38 minutes.   POSSIBLE D/C IN 1-2 DAYS, DEPENDING ON CLINICAL CONDITION.   Gladstone Lighter M.D on 07/06/2017 at 12:25 PM  Between 7am to 6pm - Pager - [phone]  After 6pm go to [email or web address] - password [email or web address] [place] [facility]  Office  [phone]  CC: Primary care physician; [facility]

## 2017-07-06 NOTE — Evaluation (Signed)
Physical Therapy Evaluation Patient Details Name: Christopher Mendez MRN: [id number] DOB: Feb 16, 1951 Today's Date: 07/06/2017   History of Present Illness  Pt admitted for acute renal failure. History includes COPD and HTN. Pt complains of trouble with voiding x 4 weeks. Now with positive hydronephrosis and foley placed.   Clinical Impression  Pt is a pleasant 67 year old male who was admitted for acute renal failure. Pt performs transfers/ ambulation with cga and RW. Pt demonstrates deficits with endurance/mobility. All mobility performed on RA with decreased sats, needing seated rest break with wheezing noted. RN notified. Would benefit from skilled PT to address above deficits and promote optimal return to PLOF. Recommend transition to [facility] upon discharge from acute hospitalization.       Follow Up Recommendations Home health PT    Equipment Recommendations  None recommended by PT    Recommendations for Other Services       Precautions / Restrictions Precautions Precautions: Fall Restrictions Weight Bearing Restrictions: No      Mobility  Bed Mobility               General bed mobility comments: not performed as pt received in chair  Transfers Overall transfer level: Needs assistance Equipment used: Rolling walker (2 wheeled) Transfers: Sit to/from Stand Sit to Stand: Min guard         General transfer comment: safe technique with pt able to push from chair safely. Able to stand with upright posture  Ambulation/Gait Ambulation/Gait assistance: Min guard Ambulation Distance (Feet): 40 Feet Assistive device: Rolling walker (2 wheeled) Gait Pattern/deviations: Step-through pattern     General Gait Details: ambulated x 2 laps in room with RW. Cues given for posture and heel strike. Fatigues with increased distances, needs seated rest break during ambulation. All mobility performed on RA with sats decreasing 84% and improving to 91% with rest and pursed lip  breathing. On 2nd lap, O2 sats decreased to 87%.  Stairs            Wheelchair Mobility    Modified Rankin (Stroke Patients Only)       Balance Overall balance assessment: History of Falls;Needs assistance Sitting-balance support: Feet supported Sitting balance-Leahy Scale: Good     Standing balance support: Bilateral upper extremity supported Standing balance-Leahy Scale: Good                               Pertinent Vitals/Pain Pain Assessment: Faces Faces Pain Scale: Hurts a little bit Pain Location: foley insertion with movement Pain Descriptors / Indicators: Aching Pain Intervention(s): Limited activity within patient's tolerance;Repositioned    Home Living Family/patient expects to be discharged to:: Private residence Living Arrangements: Spouse/significant other Available Help at Discharge: Family;Available 24 hours/day Type of Home: House Home Access: Stairs to enter Entrance Stairs-Rails: (will be installed prior to home discharge) Entrance Stairs-Number of Steps: 1 Home Layout: One level(1 step into kitchen) Home Equipment: Walker - 2 wheels;Cane - single point      Prior Function Level of Independence: Independent with assistive device(s)         Comments: used AD prior to admission, "stubbles"     Hand Dominance        Extremity/Trunk Assessment   Upper Extremity Assessment Upper Extremity Assessment: Overall WFL for tasks assessed    Lower Extremity Assessment Lower Extremity Assessment: Generalized weakness(B LE grossly 4+/5)       Communication   Communication: No difficulties  Cognition Arousal/Alertness: Awake/alert Behavior During Therapy: WFL for tasks assessed/performed Overall Cognitive Status: Within Functional Limits for tasks assessed                                        General Comments      Exercises     Assessment/Plan    PT Assessment Patient needs continued PT services  PT  Problem List Decreased strength;Decreased activity tolerance;Decreased balance;Decreased mobility;Cardiopulmonary status limiting activity;Decreased knowledge of use of DME       PT Treatment Interventions Gait training;DME instruction;Therapeutic exercise;Stair training;Balance training    PT Goals (Current goals can be found in the Care Plan section)  Acute Rehab PT Goals Patient Stated Goal: to go home PT Goal Formulation: With patient Time For Goal Achievement: 07/20/17 Potential to Achieve Goals: Good    Frequency Min 2X/week   Barriers to discharge        Co-evaluation               AM-PAC PT "6 Clicks" Daily Activity  Outcome Measure Difficulty turning over in bed (including adjusting bedclothes, sheets and blankets)?: None Difficulty moving from lying on back to sitting on the side of the bed? : None Difficulty sitting down on and standing up from a chair with arms (e.g., wheelchair, bedside commode, etc,.)?: Unable Help needed moving to and from a bed to chair (including a wheelchair)?: A Little Help needed walking in hospital room?: A Little Help needed climbing 3-5 steps with a railing? : A Lot 6 Click Score: 17    End of Session Equipment Utilized During Treatment: Gait belt Activity Tolerance: Patient tolerated treatment well Patient left: in chair;with chair alarm set;with family/visitor present Nurse Communication: Mobility status PT Visit Diagnosis: Unsteadiness on feet (R26.81);Muscle weakness (generalized) (M62.81);History of falling (Z91.81);Difficulty in walking, not elsewhere classified (R26.2)    Time: 3976-7341 PT Time Calculation (min) (ACUTE ONLY): 27 min   Charges:   PT Evaluation $PT Eval Low Complexity: 1 Low PT Treatments $Gait Training: 8-22 mins   PT G CodesGreggory Stallion, PT, DPT [phone]   Myrla Malanowski 07/06/2017, 2:47 PM

## 2017-07-07 LAB — BASIC METABOLIC PANEL
Anion gap: 7 (ref 5–15)
BUN: 35 mg/dL — AB (ref 6–20)
CHLORIDE: 107 mmol/L (ref 101–111)
CO2: 25 mmol/L (ref 22–32)
CREATININE: 1.14 mg/dL (ref 0.61–1.24)
Calcium: 8.5 mg/dL — ABNORMAL LOW (ref 8.9–10.3)
GFR calc Af Amer: >60 mL/min (ref >60)
GFR calc non Af Amer: >60 mL/min (ref >60)
GLUCOSE: 224 mg/dL — AB (ref 65–99)
POTASSIUM: 3.7 mmol/L (ref 3.5–5.1)
SODIUM: 139 mmol/L (ref 135–145)

## 2017-07-07 MED ORDER — PREDNISONE 10 MG (21) PO TBPK: ORAL_TABLET | ORAL | 0 refills | Status: DC

## 2017-07-07 MED ORDER — IPRATROPIUM-ALBUTEROL 0.5-2.5 (3) MG/3ML IN SOLN: 3.0000 mL | RESPIRATORY_TRACT | 1 refills | Status: DC

## 2017-07-07 MED ORDER — IPRATROPIUM-ALBUTEROL 0.5-2.5 (3) MG/3ML IN SOLN: 3.0000 mL | Freq: Four times a day (QID) | RESPIRATORY_TRACT | 1 refills | Status: DC | PRN

## 2017-07-07 MED ORDER — NICOTINE 21 MG/24HR TD PT24: 21.0000 mg | MEDICATED_PATCH | Freq: Every day | TRANSDERMAL | 0 refills | Status: DC

## 2017-07-07 MED ORDER — GUAIFENESIN ER 600 MG PO TB12: 600.0000 mg | ORAL_TABLET | Freq: Two times a day (BID) | ORAL | 0 refills | Status: DC

## 2017-07-07 NOTE — Telephone Encounter (Signed)
I scheduled his void and trial and follow up Can he have his PSA drawn the same day as he comes in for his voiding trial? We also will need orders for those labs.   Sharyn Lull

## 2017-07-07 NOTE — Progress Notes (Signed)
   [place] [facility] Day: 2 days Christopher Mendez is a 67 y.o. male presenting with No chief complaint on file. .   Advance care planning discussed with patient and his wife at bedside. All questions in regards to overall condition and expected prognosis answered. The decision was made to continue current code status  CODE STATUS: full code Time spent: 18 minutes

## 2017-07-07 NOTE — Discharge Instructions (Signed)

## 2017-07-07 NOTE — Telephone Encounter (Signed)
No.  PSA will need to be drawn at a later date.

## 2017-07-07 NOTE — Telephone Encounter (Signed)
How soon did you want him to come back for his follow up and PSA?

## 2017-07-07 NOTE — Discharge Summary (Addendum)
Christopher Mendez    MR#:  [id number]  DATE OF BIRTH:  09-23-1950  DATE OF ADMISSION:  07/05/2017   ADMITTING PHYSICIAN: Vaughan Basta, MD  DATE OF DISCHARGE:  07/07/17  PRIMARY CARE PHYSICIAN: [facility] [place]   ADMISSION DIAGNOSIS:   Acute kidney injury  DISCHARGE DIAGNOSIS:   Active Problems:   Acute renal failure (ARF) ([facility])   SECONDARY DIAGNOSIS:   Past Medical History:  Diagnosis Date  . Cardiac enlargement   . COPD (chronic obstructive pulmonary disease) ([place])   . HTN (hypertension)     HOSPITAL COURSE:   67 year old male with past medical history significant for COPD not on a moccasin, hypertension, ongoing smoking presents to hospital secondary to weakness, difficulty breathing and urinary retention.  1. Acute renal failure- secondary to obstructive uropathy from enlarged prostrate - Foley catheter inserted- relieved obstruction - appreciate urology consult - on flomax - discharge with foley and outpatient voiding trial, prostrate biopsy etc for enlarged prostrate - renal function improved at discharge  2. Acute on chronic COPD exacerbation- on room air, wheezing significantly on admission - received IV steroids, nebs- discharge on oral prednisone taper and nebs, inhalers- improving wheezing continue IV steroids - flu negative  3. Tobacco use disorder-on  nicotine patch- prescription given as well  4. HTN- on lisinopril  Eager to go home today Advised against smoking again  DISCHARGE CONDITIONS:   Guarded  CONSULTS OBTAINED:   Treatment Team:  Anthonette Legato, MD Franchot Gallo, MD Hollice Espy, MD  DRUG ALLERGIES:   No Known Allergies DISCHARGE MEDICATIONS:   Allergies as of 07/07/2017   No Known Allergies     Medication List    STOP taking these medications   azithromycin 250 MG tablet Commonly known as:  ZITHROMAX     hydrochlorothiazide 25 MG tablet Commonly known as:  HYDRODIURIL   predniSONE 10 MG tablet Commonly known as:  DELTASONE Replaced by:  predniSONE 10 MG (21) Tbpk tablet     TAKE these medications   albuterol 108 (90 Base) MCG/ACT inhaler Commonly known as:  PROVENTIL HFA;VENTOLIN HFA Inhale 1 puff into the lungs every 6 (six) hours as needed for wheezing or shortness of breath.   Fluticasone-Salmeterol 500-50 MCG/DOSE Aepb Commonly known as:  ADVAIR Inhale 1 puff into the lungs 2 (two) times daily.   guaiFENesin 600 MG 12 hr tablet Commonly known as:  MUCINEX Take 1 tablet (600 mg total) by mouth 2 (two) times daily.   ipratropium-albuterol 0.5-2.5 (3) MG/3ML Soln Commonly known as:  DUONEB Take 3 mLs by nebulization every 6 (six) hours as needed (wheezing, shortness of breath).   lisinopril 40 MG tablet Commonly known as:  PRINIVIL,ZESTRIL Take 40 mg by mouth daily.   nicotine 21 mg/24hr patch Commonly known as:  NICODERM CQ - dosed in mg/24 hours Place 1 patch (21 mg total) onto the skin daily. Start taking on:  07/08/2017   predniSONE 10 MG (21) Tbpk tablet Commonly known as:  STERAPRED UNI-PAK 21 TAB 6 tabs PO x 1 day 5 tabs PO x 1 day 4 tabs PO x 1 day 3 tabs PO x 1 day 2 tabs PO x 1 day 1 tab PO x 1 day and stop Replaces:  predniSONE 10 MG tablet   tamsulosin 0.4 MG Caps capsule Commonly known as:  FLOMAX Take 0.4 mg by mouth daily.   tiotropium 18 MCG inhalation capsule Commonly known as:  SPIRIVA Place 18 mcg into inhaler and inhale daily.            Durable Medical Equipment  (From admission, onward)        Start     Ordered   07/07/17 1044  For home use only DME Nebulizer machine  Once    Question:  Patient needs a nebulizer to treat with the following condition  Answer:  COPD (chronic obstructive pulmonary disease) ([place])   07/07/17 1044       DISCHARGE INSTRUCTIONS:   1. PCP f/u in 1 week 2. Urology f/u in 1-2 weeks  DIET:    Cardiac diet  ACTIVITY:   Activity as tolerated  OXYGEN:   Home Oxygen: No.  Oxygen Delivery: room air  DISCHARGE LOCATION:   home   If you experience worsening of your admission symptoms, develop shortness of breath, life threatening emergency, suicidal or homicidal thoughts you must seek medical attention immediately by calling 911 or calling your MD immediately  if symptoms less severe.  You Must read complete instructions/literature along with all the possible adverse reactions/side effects for all the Medicines you take and that have been prescribed to you. Take any new Medicines after you have completely understood and accpet all the possible adverse reactions/side effects.   Please note  You were cared for by a hospitalist during your hospital stay. If you have any questions about your discharge medications or the care you received while you were in the hospital after you are discharged, you can call the unit and asked to speak with the hospitalist on call if the hospitalist that took care of you is not available. Once you are discharged, your primary care physician will handle any further medical issues. Please note that NO REFILLS for any discharge medications will be authorized once you are discharged, as it is imperative that you return to your primary care physician (or establish a relationship with a primary care physician if you do not have one) for your aftercare needs so that they can reassess your need for medications and monitor your lab values.    On the day of Discharge:  VITAL SIGNS:   Blood pressure (!) 153/67, pulse 90, temperature 98.8 F (37.1 C), temperature source Oral, resp. rate 20, height 6' (1.829 m), weight 112.1 kg (247 lb 2.2 oz), SpO2 92 %.  PHYSICAL EXAMINATION:   GENERAL:  67 y.o.-year-old patient lying in the bed with no acute distress.  EYES: Pupils equal, round, reactive to light and accommodation. No scleral icterus. Extraocular muscles  intact.  HEENT: Head atraumatic, normocephalic. Oropharynx and nasopharynx clear.  NECK:  Supple, no jugular venous distention. No thyroid enlargement, no tenderness.  LUNGS:  improved wheezing all over the lung fields, moving air bilaterally,  no crackles, rales or rhonchi. No use of accessory muscles to breathe CARDIOVASCULAR: S1, S2 normal. No murmurs, rubs, or gallops.  ABDOMEN: Soft, nontender, nondistended. Bowel sounds present. No organomegaly or mass.  EXTREMITIES: No pedal edema, cyanosis, or clubbing.  NEUROLOGIC: Cranial nerves II through XII are intact. Muscle strength 5/5 in all extremities. Sensation intact. Gait not checked. Global weakness noted PSYCHIATRIC: The patient is alert and oriented x 3.  SKIN: No obvious rash, lesion, or ulcer.    DATA REVIEW:   CBC Recent Labs  Lab 07/06/17 0544  WBC 5.2  HGB 11.2*  HCT 35.9*  PLT 326    Chemistries  Recent Labs  Lab 07/05/17 1332  07/07/17 0342  NA 139   < >  139  K 4.6   < > 3.7  CL 108   < > 107  CO2 18*   < > 25  GLUCOSE 129*   < > 224*  BUN 62*   < > 35*  CREATININE 4.74*   < > 1.14  CALCIUM 8.8*   < > 8.5*  AST 23  --   --   ALT 17  --   --   ALKPHOS 83  --   --   BILITOT 0.6  --   --    < > = values in this interval not displayed.     Microbiology Results  No results found for this or any previous visit.  RADIOLOGY:  No results found.   Management plans discussed with the patient, family and they are in agreement.  CODE STATUS:     Code Status Orders  (From admission, onward)        Start     Ordered   07/05/17 1340  Full code  Continuous     07/05/17 1340    Code Status History    Date Active Date Inactive Code Status Order ID Comments User Context   This patient has a current code status but no historical code status.      TOTAL TIME TAKING CARE OF THIS PATIENT: 38 minutes.    Gladstone Lighter M.D on 07/07/2017 at 10:48 AM  Between 7am to 6pm - Pager -  [phone]  After 6pm go to [email or web address] - [employer] [facility]  Office  [phone]  CC: Primary care physician; [facility], [facility]   Note: This dictation was prepared with Dragon dictation along with smaller phrase technology. Any transcriptional errors that result from this process are unintentional.

## 2017-07-07 NOTE — Progress Notes (Signed)
Christopher Mendez to be D/C'd home per MD order.  Discussed prescriptions and follow up appointments with the patient. Prescriptions given to patient, medication list explained in detail. Pt verbalized understanding.  Allergies as of 07/07/2017   No Known Allergies     Medication List    STOP taking these medications   azithromycin 250 MG tablet Commonly known as:  ZITHROMAX   hydrochlorothiazide 25 MG tablet Commonly known as:  HYDRODIURIL   predniSONE 10 MG tablet Commonly known as:  DELTASONE Replaced by:  predniSONE 10 MG (21) Tbpk tablet     TAKE these medications   albuterol 108 (90 Base) MCG/ACT inhaler Commonly known as:  PROVENTIL HFA;VENTOLIN HFA Inhale 1 puff into the lungs every 6 (six) hours as needed for wheezing or shortness of breath.   Fluticasone-Salmeterol 500-50 MCG/DOSE Aepb Commonly known as:  ADVAIR Inhale 1 puff into the lungs 2 (two) times daily.   guaiFENesin 600 MG 12 hr tablet Commonly known as:  MUCINEX Take 1 tablet (600 mg total) by mouth 2 (two) times daily.   ipratropium-albuterol 0.5-2.5 (3) MG/3ML Soln Commonly known as:  DUONEB Take 3 mLs by nebulization every 6 (six) hours as needed (wheezing, shortness of breath).   lisinopril 40 MG tablet Commonly known as:  PRINIVIL,ZESTRIL Take 40 mg by mouth daily.   nicotine 21 mg/24hr patch Commonly known as:  NICODERM CQ - dosed in mg/24 hours Place 1 patch (21 mg total) onto the skin daily. Start taking on:  07/08/2017   predniSONE 10 MG (21) Tbpk tablet Commonly known as:  STERAPRED UNI-PAK 21 TAB 6 tabs PO x 1 day 5 tabs PO x 1 day 4 tabs PO x 1 day 3 tabs PO x 1 day 2 tabs PO x 1 day 1 tab PO x 1 day and stop Replaces:  predniSONE 10 MG tablet   tamsulosin 0.4 MG Caps capsule Commonly known as:  FLOMAX Take 0.4 mg by mouth daily.   tiotropium 18 MCG inhalation capsule Commonly known as:  SPIRIVA Place 18 mcg into inhaler and inhale daily.            Durable Medical  Equipment  (From admission, onward)        Start     Ordered   07/07/17 1044  For home use only DME Nebulizer machine  Once    Question:  Patient needs a nebulizer to treat with the following condition  Answer:  COPD (chronic obstructive pulmonary disease) ([place])   07/07/17 1044      Vitals:   07/07/17 0546 07/07/17 1158  BP: (!) 153/67   Pulse: 90   Resp: 20   Temp: 98.8 F (37.1 C)   SpO2: 92% 94%    Skin clean, dry and intact without evidence of skin break down, no evidence of skin tears noted. IV catheter discontinued intact. Site without signs and symptoms of complications. Dressing and pressure applied. Pt denies pain at this time. No complaints noted. Extensive foley education provided to patient and spouse, teachback demonstrated.  An After Visit Summary was printed and given to the patient. Patient escorted via [place], and D/C home via private auto.  Christopher Mendez A

## 2017-07-07 NOTE — Care Management Note (Signed)
Case Management Note  Patient Details  Name: Christopher Mendez MRN: [id number] Date of Birth: Dec 27, 1950   Patient to discharge home today.  Nebulizer was delivered to room prior to discharge.  Referral was called with Brittney with [facility].   Subjective/Objective:                    Action/Plan:   Expected Discharge Date:  07/07/17               Expected Discharge Plan:  [place]  In-House Referral:     Discharge planning Services  CM Consult  Post Acute Care Choice:  Durable Medical Equipment, Home Health Choice offered to:  Patient, Spouse  DME Arranged:  Nebulizer machine DME Agency:  [place] Arranged:  RN, PT [facility] Agency:  [facility]  Status of Service:  Completed, signed off  If discussed at [place] of Stay Meetings, dates discussed:    Additional Comments:  Beverly Sessions, RN 07/07/2017, 2:17 PM

## 2017-07-08 NOTE — Telephone Encounter (Signed)
I will schedule his return appointment when I see him for his TOV.

## 2017-07-22 ENCOUNTER — Ambulatory Visit: Payer: Self-pay | Admitting: Urology

## 2017-07-26 ENCOUNTER — Encounter: Payer: Self-pay | Admitting: Urology

## 2017-07-26 ENCOUNTER — Ambulatory Visit (INDEPENDENT_AMBULATORY_CARE_PROVIDER_SITE_OTHER): Payer: Medicare Other | Admitting: Urology

## 2017-07-26 VITALS — BP 106/58 | HR 88 | Resp 16 | Ht 72.0 in | Wt 238.0 lb

## 2017-07-26 DIAGNOSIS — R338 Other retention of urine: Secondary | ICD-10-CM | POA: Diagnosis not present

## 2017-07-26 MED ORDER — TAMSULOSIN HCL 0.4 MG PO CAPS: 0.4000 mg | ORAL_CAPSULE | Freq: Every day | ORAL | 11 refills | Status: DC

## 2017-07-26 NOTE — Progress Notes (Signed)
07/26/2017 9:21 AM   Christopher Mendez Apr 11, 1951 [id number]  Referring provider: [facility], [facility] [place], [place] [address]  Chief Complaint  Patient presents with  . Urinary Retention    New Patient    HPI: 67 year old Caucasian male with urinary retention and an abnormal prostate exam who presents today for a TOV after hospitalization for ARF due to retention with his wife, Christopher Mendez.    He states he had no issues with urination until a few weeks prior to his ED presentation.   He was started to experience frequency, urgency, dysuria, nocturia, incontinence, intermittency, hesitancy, straining to urinate and a weak urinary stream.  Blood work in the ED noted a rise in his creatinine to 4.74.  RUS performed on 07/05/2017 noted moderate hydronephrosis bilaterally.  A Foley was inserted and 1400 cc of urine was obtained.  Creatinine normalized to 1.14 upon discharge,  He was discharged with a Foley.  Creatinine was 1.14 upon discharge.    His prostate exam was abnormal. He had never seen an urologist.  He has not had previous PSA's or DRE's.    He states that prior to his emergency room presentation he was having urinary frequency, urgency, dysuria, nocturia, incontinence, intermittency, hesitancy, straining to urinate and a weak urinary stream.  Prior to this, he states he was having no difficulty with urination.  He states he does have erectile dysfunction.  Patient denies any gross hematuria, dysuria or suprapubic/flank pain.  Patient denies any fevers, chills, nausea or vomiting.   PMH: Past Medical History:  Diagnosis Date  . Cardiac enlargement   . COPD (chronic obstructive pulmonary disease) ([place])   . HTN (hypertension)     Surgical History: None.    Home Medications:  Allergies as of 07/26/2017   No Known Allergies     Medication List        Accurate as of 07/26/17  9:21 AM. Always use your most recent med list.          albuterol  108 (90 Base) MCG/ACT inhaler Commonly known as:  PROVENTIL HFA;VENTOLIN HFA Inhale 1 puff into the lungs every 6 (six) hours as needed for wheezing or shortness of breath.   Fluticasone-Salmeterol 500-50 MCG/DOSE Aepb Commonly known as:  ADVAIR Inhale 1 puff into the lungs 2 (two) times daily.   guaiFENesin 600 MG 12 hr tablet Commonly known as:  MUCINEX Take 1 tablet (600 mg total) by mouth 2 (two) times daily.   ipratropium-albuterol 0.5-2.5 (3) MG/3ML Soln Commonly known as:  DUONEB Take 3 mLs by nebulization every 6 (six) hours as needed (wheezing, shortness of breath).   lisinopril 40 MG tablet Commonly known as:  PRINIVIL,ZESTRIL Take 40 mg by mouth daily.   nicotine 21 mg/24hr patch Commonly known as:  NICODERM CQ - dosed in mg/24 hours Place 1 patch (21 mg total) onto the skin daily.   predniSONE 10 MG (21) Tbpk tablet Commonly known as:  STERAPRED UNI-PAK 21 TAB 6 tabs PO x 1 day 5 tabs PO x 1 day 4 tabs PO x 1 day 3 tabs PO x 1 day 2 tabs PO x 1 day 1 tab PO x 1 day and stop   tamsulosin 0.4 MG Caps capsule Commonly known as:  FLOMAX Take 1 capsule (0.4 mg total) by mouth daily.   tiotropium 18 MCG inhalation capsule Commonly known as:  SPIRIVA Place 18 mcg into inhaler and inhale daily.  Allergies: No Known Allergies  Family History: Family History  Problem Relation Age of Onset  . Hypertension Mother   . Diabetes Father     Social History:  reports that he has been smoking cigarettes.  He has a 56.00 pack-year smoking history. he has never used smokeless tobacco. He reports that he drinks about 25.2 oz of alcohol per week. He reports that he does not use drugs.  ROS: UROLOGY Frequent Urination?: Yes Hard to postpone urination?: Yes Burning/pain with urination?: Yes Get up at night to urinate?: Yes Leakage of urine?: Yes Urine stream starts and stops?: Yes Trouble starting stream?: Yes Do you have to strain to urinate?: Yes Blood in  urine?: No Urinary tract infection?: No Sexually transmitted disease?: No Injury to kidneys or bladder?: No Painful intercourse?: No Weak stream?: Yes Erection problems?: Yes Penile pain?: No  Gastrointestinal Nausea?: No Vomiting?: No Indigestion/heartburn?: Yes Diarrhea?: No Constipation?: No  Constitutional Fever: No Night sweats?: Yes Weight loss?: Yes Fatigue?: Yes  Skin Skin rash/lesions?: No Itching?: No  Eyes Blurred vision?: No Double vision?: No  Ears/Nose/Throat Sore throat?: No Sinus problems?: No  Hematologic/Lymphatic Swollen glands?: Yes Easy bruising?: Yes  Cardiovascular Leg swelling?: No Chest pain?: No  Respiratory Cough?: Yes Shortness of breath?: Yes  Endocrine Excessive thirst?: No  Musculoskeletal Back pain?: Yes Joint pain?: Yes  Neurological Headaches?: Yes Dizziness?: Yes  Psychologic Depression?: No Anxiety?: Yes  Physical Exam: BP (!) 106/58   Pulse 88   Resp 16   Ht 6' (1.829 m)   Wt 238 lb (108 kg)   SpO2 91%   BMI 32.28 kg/m   Constitutional: Well nourished. Alert and oriented, No acute distress. HEENT: [place] AT, moist mucus membranes. Trachea midline, no masses. Cardiovascular: No clubbing, cyanosis, or edema. Respiratory: Normal respiratory effort, no increased work of breathing. GI: Abdomen is soft, non tender, non distended, no abdominal masses. Liver and spleen not palpable.  No hernias appreciated.  Stool sample for occult testing is not indicated.   GU: No CVA tenderness.  No bladder fullness or masses.  Patient with circumcised phallus.  Urethral meatus is patent.  No penile discharge. No penile lesions or rashes. Scrotum without lesions, cysts, rashes and/or edema.  Testicles are located scrotally bilaterally. No masses are appreciated in the testicles. Left and right epididymis are normal. Rectal: Patient with  normal sphincter tone. Anus and perineum without scarring or rashes. No rectal masses are  appreciated. Prostate is approximately 60 grams, nodular.  Seminal vesicles are normal. Skin: No rashes, bruises or suspicious lesions. Lymph: No cervical or inguinal adenopathy. Neurologic: Grossly intact, no focal deficits, moving all 4 extremities. Psychiatric: Normal mood and affect.  Laboratory Data: Lab Results  Component Value Date   WBC 5.2 07/06/2017   HGB 11.2 (L) 07/06/2017   HCT 35.9 (L) 07/06/2017   MCV 65.1 (L) 07/06/2017   PLT 326 07/06/2017    Lab Results  Component Value Date   CREATININE 1.14 07/07/2017    No results found for: PSA  No results found for: TESTOSTERONE  No results found for: HGBA1C  No results found for: TSH  No results found for: CHOL, HDL, CHOLHDL, VLDL, LDLCALC  Lab Results  Component Value Date   AST 23 07/05/2017   Lab Results  Component Value Date   ALT 17 07/05/2017   No components found for: ALKALINEPHOPHATASE No components found for: BILIRUBINTOTAL  No results found for: ESTRADIOL  Urinalysis No results found for: COLORURINE, APPEARANCEUR, [place], [place],  GLUCOSEU, HGBUR, BILIRUBINUR, KETONESUR, PROTEINUR, UROBILINOGEN, NITRITE, LEUKOCYTESUR  I have reviewed the labs.   Pertinent Imaging: CLINICAL DATA:  Acute renal failure  EXAM: RENAL / URINARY TRACT ULTRASOUND COMPLETE  COMPARISON:  None.  FINDINGS: Right Kidney:  Length: 13 cm. Moderate hydronephrosis is noted. 3.4 cm cyst is noted in the upper pole.  Left Kidney:  Length: 11.8 cm. Moderate hydronephrosis is noted similar to that seen on the right.  Bladder:  Bladder is well distended. Prostate is mildly prominent. Prevoid bladder volume was 1315 mL and the postvoid volume is 1276 mL  IMPRESSION: Moderate hydronephrosis bilaterally. This may be related to significantly distended bladder. Voiding was attempted and only a minimal amount of urine was passed. This raises suspicion for chronic outlet obstruction.   Electronically  Signed   By: Inez Catalina M.D.   On: 07/05/2017 14:30  CLINICAL DATA:  Weakness  EXAM: ABDOMEN - 1 VIEW  COMPARISON:  None.  FINDINGS: Scattered large and small bowel gas is noted. No obstructive changes are seen. Fullness in the bladder is again identified. No acute bony abnormality is seen. Degenerative changes of lumbar spine are noted.  IMPRESSION: Distended bladder.  No other focal abnormality is noted.   Electronically Signed   By: Inez Catalina M.D.   On: 07/05/2017 14:55  I have independently reviewed the films.    Assessment & Plan:    1. Acute urinary retention:     -foley catheter removed  -voiding trial today  - patient failed TOV  -RTC next week  2. Nodular prostate -worrisome for prostate cancer -obtain a PSA when patient returns next week   Return for keep 08/02/2017 appointment.  These notes generated with voice recognition software. I apologize for typographical errors.  Zara Council, [facility] [address], [place], [place] [address] [phone]

## 2017-07-26 NOTE — Progress Notes (Signed)
Catheter Removal  Patient is present today for a catheter removal.  29ml of water was drained from the balloon. A 16FR foley cath was removed from the bladder no complications were noted . Patient tolerated well.  Preformed by: Fonnie Jarvis, CMA Simple Catheter Placement  Due to urinary retention patient is present today for a foley cath placement.  Patient was cleaned and prepped in a sterile fashion with betadine and lidocaine jelly 2% was instilled into the urethra.  A 16 FR coude foley catheter was inserted, urine return was noted  568ml, urine was amber in color.  The balloon was filled with 10cc of sterile water.  A leg bag was attached for drainage. Patient was also given a night bag to take home and was given instruction on how to change from one bag to another.  Patient was given instruction on proper catheter care.  Patient tolerated well, no complications were noted   Preformed by: Fonnie Jarvis, CMA  Additional notes/ Follow up: 1 week

## 2017-08-02 ENCOUNTER — Ambulatory Visit (INDEPENDENT_AMBULATORY_CARE_PROVIDER_SITE_OTHER): Payer: Medicare Other | Admitting: Urology

## 2017-08-02 ENCOUNTER — Other Ambulatory Visit: Payer: Self-pay

## 2017-08-02 VITALS — BP 138/76 | HR 96 | Ht 72.0 in | Wt 238.0 lb

## 2017-08-02 DIAGNOSIS — R972 Elevated prostate specific antigen [PSA]: Secondary | ICD-10-CM

## 2017-08-02 DIAGNOSIS — N402 Nodular prostate without lower urinary tract symptoms: Secondary | ICD-10-CM

## 2017-08-02 DIAGNOSIS — R338 Other retention of urine: Secondary | ICD-10-CM | POA: Diagnosis not present

## 2017-08-02 NOTE — Progress Notes (Signed)
08/02/2017 11:28 AM   Christopher Mendez 04-Mar-1951 [id number]  Referring provider: Center, [facility] [place],  [address]  Chief Complaint  Patient presents with  . Follow-up    HPI: 67 yo WM with urinary retention and an abnormal prostate exam who presents today for a one-week follow-up after failed trial with his wife, Christopher Mendez.    Background history 67 year old Caucasian male with urinary retention and an abnormal prostate exam who presents today for a TOV after hospitalization for ARF due to retention with his wife, Christopher Mendez.  He states he had no issues with urination until a few weeks prior to his ED presentation.   He was started to experience frequency, urgency, dysuria, nocturia, incontinence, intermittency, hesitancy, straining to urinate and a weak urinary stream.  Blood work in the ED noted a rise in his creatinine to 4.74.  RUS performed on 07/05/2017 noted moderate hydronephrosis bilaterally.  A Foley was inserted and 1400 cc of urine was obtained.  Creatinine normalized to 1.14 upon discharge,  He was discharged with a Foley.  Creatinine was 1.14 upon discharge.  His prostate exam was abnormal. He had never seen an urologist.  He has not had previous PSA's or DRE's.  He states that prior to his emergency room presentation he was having urinary frequency, urgency, dysuria, nocturia, incontinence, intermittency, hesitancy, straining to urinate and a weak urinary stream.  Prior to this, he states he was having no difficulty with urination.  He states he does have erectile dysfunction.  Patient denies any gross hematuria, dysuria or suprapubic/flank pain.  Patient denies any fevers, chills, nausea or vomiting.   Last week he failed his voiding trial.  He and his wife do remember that prior to the hospitalization he did have a PSA drawn at his primary care physician's office.  The PSA on July 04, 2017 was 41.6 ng/mL.     PMH: Past Medical History:    Diagnosis Date  . Cardiac enlargement   . COPD (chronic obstructive pulmonary disease) ([facility])   . HTN (hypertension)     Surgical History: None.    Home Medications:  Allergies as of 08/02/2017   No Known Allergies     Medication List        Accurate as of 08/02/17 11:28 AM. Always use your most recent med list.          albuterol 108 (90 Base) MCG/ACT inhaler Commonly known as:  PROVENTIL HFA;VENTOLIN HFA Inhale 1 puff into the lungs every 6 (six) hours as needed for wheezing or shortness of breath.   Fluticasone-Salmeterol 500-50 MCG/DOSE Aepb Commonly known as:  ADVAIR Inhale 1 puff into the lungs 2 (two) times daily.   guaiFENesin 600 MG 12 hr tablet Commonly known as:  MUCINEX Take 1 tablet (600 mg total) by mouth 2 (two) times daily.   ipratropium-albuterol 0.5-2.5 (3) MG/3ML Soln Commonly known as:  DUONEB Take 3 mLs by nebulization every 6 (six) hours as needed (wheezing, shortness of breath).   lisinopril 40 MG tablet Commonly known as:  PRINIVIL,ZESTRIL Take 40 mg by mouth daily.   nicotine 21 mg/24hr patch Commonly known as:  NICODERM CQ - dosed in mg/24 hours Place 1 patch (21 mg total) onto the skin daily.   predniSONE 10 MG (21) Tbpk tablet Commonly known as:  STERAPRED UNI-PAK 21 TAB 6 tabs PO x 1 day 5 tabs PO x 1 day 4 tabs PO x 1 day 3 tabs PO  x 1 day 2 tabs PO x 1 day 1 tab PO x 1 day and stop   tamsulosin 0.4 MG Caps capsule Commonly known as:  FLOMAX Take 1 capsule (0.4 mg total) by mouth daily.   tiotropium 18 MCG inhalation capsule Commonly known as:  SPIRIVA Place 18 mcg into inhaler and inhale daily.       Allergies: No Known Allergies  Family History: Family History  Problem Relation Age of Onset  . Hypertension Mother   . Diabetes Father     Social History:  reports that he has been smoking cigarettes.  He has a 56.00 pack-year smoking history. he has never used smokeless tobacco. He reports that he drinks about 25.2  oz of alcohol per week. He reports that he does not use drugs.  ROS: UROLOGY Frequent Urination?: No Hard to postpone urination?: No Burning/pain with urination?: No Get up at night to urinate?: No Leakage of urine?: No Urine stream starts and stops?: No Trouble starting stream?: No Do you have to strain to urinate?: No Blood in urine?: No Urinary tract infection?: No Sexually transmitted disease?: No Injury to kidneys or bladder?: No Painful intercourse?: No Weak stream?: No Erection problems?: No Penile pain?: No  Gastrointestinal Nausea?: No Vomiting?: No Indigestion/heartburn?: No Diarrhea?: No Constipation?: No  Constitutional Fever: No Night sweats?: No Weight loss?: No Fatigue?: Yes  Skin Skin rash/lesions?: No Itching?: No  Eyes Blurred vision?: No Double vision?: No  Ears/Nose/Throat Sore throat?: No Sinus problems?: No  Hematologic/Lymphatic Swollen glands?: No Easy bruising?: No  Cardiovascular Leg swelling?: No Chest pain?: No  Respiratory Cough?: Yes Shortness of breath?: Yes  Endocrine Excessive thirst?: No  Musculoskeletal Back pain?: No Joint pain?: No  Neurological Headaches?: No Dizziness?: No  Psychologic Depression?: No Anxiety?: Yes  Physical Exam: BP 138/76   Pulse 96   Ht 6' (1.829 m)   Wt 238 lb (108 kg)   BMI 32.28 kg/m   Constitutional: Well nourished. Alert and oriented, No acute distress. HEENT: [place] AT, moist mucus membranes. Trachea midline, no masses. Cardiovascular: No clubbing, cyanosis, or edema. Respiratory: Normal respiratory effort, no increased work of breathing. Skin: No rashes, bruises or suspicious lesions. Lymph: No cervical or inguinal adenopathy. Neurologic: Grossly intact, no focal deficits, moving all 4 extremities. Psychiatric: Normal mood and affect.  Laboratory Data: PSA History 41.6 in 06/2017  Lab Results  Component Value Date   WBC 5.2 07/06/2017   HGB 11.2 (L)  07/06/2017   HCT 35.9 (L) 07/06/2017   MCV 65.1 (L) 07/06/2017   PLT 326 07/06/2017    Lab Results  Component Value Date   CREATININE 1.14 07/07/2017    No results found for: PSA  No results found for: TESTOSTERONE  No results found for: HGBA1C  No results found for: TSH  No results found for: CHOL, HDL, CHOLHDL, VLDL, LDLCALC  Lab Results  Component Value Date   AST 23 07/05/2017   Lab Results  Component Value Date   ALT 17 07/05/2017   No components found for: ALKALINEPHOPHATASE No components found for: BILIRUBINTOTAL  No results found for: ESTRADIOL  Urinalysis No results found for: COLORURINE, APPEARANCEUR, LABSPEC, PHURINE, GLUCOSEU, HGBUR, BILIRUBINUR, KETONESUR, PROTEINUR, UROBILINOGEN, NITRITE, LEUKOCYTESUR  I have reviewed the labs.   Pertinent Imaging: CLINICAL DATA:  Acute renal failure  EXAM: RENAL / URINARY TRACT ULTRASOUND COMPLETE  COMPARISON:  None.  FINDINGS: Right Kidney:  Length: 13 cm. Moderate hydronephrosis is noted. 3.4 cm cyst is noted in the  upper pole.  Left Kidney:  Length: 11.8 cm. Moderate hydronephrosis is noted similar to that seen on the right.  Bladder:  Bladder is well distended. Prostate is mildly prominent. Prevoid bladder volume was 1315 mL and the postvoid volume is 1276 mL  IMPRESSION: Moderate hydronephrosis bilaterally. This may be related to significantly distended bladder. Voiding was attempted and only a minimal amount of urine was passed. This raises suspicion for chronic outlet obstruction.   Electronically Signed   By: Inez Catalina M.D.   On: 07/05/2017 14:30  CLINICAL DATA:  Weakness  EXAM: ABDOMEN - 1 VIEW  COMPARISON:  None.  FINDINGS: Scattered large and small bowel gas is noted. No obstructive changes are seen. Fullness in the bladder is again identified. No acute bony abnormality is seen. Degenerative changes of lumbar spine are noted.  IMPRESSION: Distended  bladder.  No other focal abnormality is noted.   Electronically Signed   By: Inez Catalina M.D.   On: 07/05/2017 14:55  I have independently reviewed the films.    Assessment & Plan:    1. Elevated PSA -Repeating the PSA today, but as I explained to both he and his wife that at this time with the abnormal prostate exam and the elevated PSA, he has a clinical diagnosis of prostate cancer -We will need to move forward with staging imaging and will be ordering a CT scan of the abdomen and pelvis with contrast and bone scan -We will also need to pursue a prostate biopsy -due to his indwelling Foley catheter he is at a high risk for sepsis after the biopsy -we will obtain a urine culture and treat any positive culture with antibiotics prior to the biopsy -we will also given injection of ceftriaxone and gentamicin in the office in place of the Cipro for broader antibiotic bacterial coverage  2. Acute urinary retention:     -patient failed TOV   3. Nodular prostate -worrisome for prostate cancer -obtain a PSA    Return for pending labs.  These notes generated with voice recognition software. I apologize for typographical errors.  Zara Council, [facility] [address], [place], [place] [address] [phone]

## 2017-08-03 ENCOUNTER — Encounter: Payer: Self-pay | Admitting: Urology

## 2017-08-03 ENCOUNTER — Ambulatory Visit (INDEPENDENT_AMBULATORY_CARE_PROVIDER_SITE_OTHER): Payer: Medicare Other | Admitting: Urology

## 2017-08-03 ENCOUNTER — Telehealth: Payer: Self-pay | Admitting: Urology

## 2017-08-03 VITALS — BP 151/79 | HR 91 | Resp 16 | Ht 72.0 in | Wt 240.0 lb

## 2017-08-03 DIAGNOSIS — C61 Malignant neoplasm of prostate: Secondary | ICD-10-CM | POA: Diagnosis not present

## 2017-08-03 LAB — PSA: Prostate Specific Ag, Serum: 56.7 ng/mL — ABNORMAL HIGH (ref 0.0–4.0)

## 2017-08-03 NOTE — Telephone Encounter (Signed)
Patient needs his CT scan and bone scan done next week.

## 2017-08-03 NOTE — Progress Notes (Signed)
08/03/2017 1:12 PM   Christopher Mendez April 11, 1951 [id number]  Referring provider: Center, [facility] [place], [place] [address]  No chief complaint on file.   HPI: 67 year old Caucasian male with clinical prostate cancer and urinary retention who presents today to give [place] urine to be sent for culture.  Background history 67 year old Caucasian male with urinary retention and an abnormal prostate exam who presents today for a TOV after hospitalization for ARF due to retention with his wife, Christopher Mendez.  He states he had no issues with urination until a few weeks prior to his ED presentation.   He was started to experience frequency, urgency, dysuria, nocturia, incontinence, intermittency, hesitancy, straining to urinate and a weak urinary stream.  Blood work in the ED noted a rise in his creatinine to 4.74.  RUS performed on 07/05/2017 noted moderate hydronephrosis bilaterally.  A Foley was inserted and 1400 cc of urine was obtained.  Creatinine normalized to 1.14 upon discharge,  He was discharged with a Foley.  Creatinine was 1.14 upon discharge.  His prostate exam was abnormal. He had never seen an urologist.  He has not had previous PSA's or DRE's.  He states that prior to his emergency room presentation he was having urinary frequency, urgency, dysuria, nocturia, incontinence, intermittency, hesitancy, straining to urinate and a weak urinary stream.  Prior to this, he states he was having no difficulty with urination.  He states he does have erectile dysfunction.  Patient denies any gross hematuria, dysuria or suprapubic/flank pain.  Patient denies any fevers, chills, nausea or vomiting.   Last week he failed his voiding trial.  He and his wife do remember that prior to the hospitalization he did have a PSA drawn at his primary care physician's office.  The PSA on July 04, 2017 was 41.6 ng/mL.   Recent PSA 56.7.    PMH: Past Medical History:  Diagnosis Date    . Cardiac enlargement   . COPD (chronic obstructive pulmonary disease) ([facility])   . HTN (hypertension)     Surgical History: None.    Home Medications:  Allergies as of 08/03/2017   No Known Allergies     Medication List        Accurate as of 08/03/17 11:59 PM. Always use your most recent med list.          albuterol 108 (90 Base) MCG/ACT inhaler Commonly known as:  PROVENTIL HFA;VENTOLIN HFA Inhale 1 puff into the lungs every 6 (six) hours as needed for wheezing or shortness of breath.   Fluticasone-Salmeterol 500-50 MCG/DOSE Aepb Commonly known as:  ADVAIR Inhale 1 puff into the lungs 2 (two) times daily.   guaiFENesin 600 MG 12 hr tablet Commonly known as:  MUCINEX Take 1 tablet (600 mg total) by mouth 2 (two) times daily.   ipratropium-albuterol 0.5-2.5 (3) MG/3ML Soln Commonly known as:  DUONEB Take 3 mLs by nebulization every 6 (six) hours as needed (wheezing, shortness of breath).   lisinopril 40 MG tablet Commonly known as:  PRINIVIL,ZESTRIL Take 40 mg by mouth daily.   nicotine 21 mg/24hr patch Commonly known as:  NICODERM CQ - dosed in mg/24 hours Place 1 patch (21 mg total) onto the skin daily.   predniSONE 10 MG (21) Tbpk tablet Commonly known as:  STERAPRED UNI-PAK 21 TAB 6 tabs PO x 1 day 5 tabs PO x 1 day 4 tabs PO x 1 day 3 tabs PO x 1 day 2 tabs  PO x 1 day 1 tab PO x 1 day and stop   tamsulosin 0.4 MG Caps capsule Commonly known as:  FLOMAX Take 1 capsule (0.4 mg total) by mouth daily.   tiotropium 18 MCG inhalation capsule Commonly known as:  SPIRIVA Place 18 mcg into inhaler and inhale daily.       Allergies: No Known Allergies  Family History: Family History  Problem Relation Age of Onset  . Hypertension Mother   . Diabetes Father     Social History:  reports that he has been smoking cigarettes.  He has a 56.00 pack-year smoking history. He has never used smokeless tobacco. He reports that he drinks about 25.2 oz of alcohol  per week. He reports that he does not use drugs.  ROS: UROLOGY Frequent Urination?: No Hard to postpone urination?: No Burning/pain with urination?: No Get up at night to urinate?: No Leakage of urine?: No Urine stream starts and stops?: No Trouble starting stream?: No Do you have to strain to urinate?: No Blood in urine?: No Urinary tract infection?: No Sexually transmitted disease?: No Injury to kidneys or bladder?: No Painful intercourse?: No Weak stream?: No Erection problems?: No Penile pain?: No  Gastrointestinal Nausea?: No Vomiting?: No Indigestion/heartburn?: No Diarrhea?: No Constipation?: No  Constitutional Fever: No Night sweats?: No Weight loss?: No Fatigue?: Yes  Skin Skin rash/lesions?: No Itching?: No  Eyes Blurred vision?: No Double vision?: No  Ears/Nose/Throat Sore throat?: No Sinus problems?: No  Hematologic/Lymphatic Swollen glands?: No Easy bruising?: No  Cardiovascular Leg swelling?: No Chest pain?: No  Respiratory Cough?: Yes Shortness of breath?: Yes  Endocrine Excessive thirst?: No  Musculoskeletal Back pain?: Yes Joint pain?: No  Neurological Headaches?: No Dizziness?: No  Psychologic Depression?: No Anxiety?: Yes  Physical Exam: BP (!) 151/79   Pulse 91   Resp 16   Ht 6' (1.829 m)   Wt 240 lb (108.9 kg)   SpO2 98%   BMI 32.55 kg/m   Constitutional: Well nourished. Alert and oriented, No acute distress. HEENT: [place] AT, moist mucus membranes. Trachea midline, no masses. Cardiovascular: No clubbing, cyanosis, or edema. Respiratory: Normal respiratory effort, no increased work of breathing. Skin: No rashes, bruises or suspicious lesions. Lymph: No cervical or inguinal adenopathy. Neurologic: Grossly intact, no focal deficits, moving all 4 extremities. Psychiatric: Normal mood and affect.   Laboratory Data: PSA History 41.6 in 06/2017 56.7 in 07/2017  Lab Results  Component Value Date   WBC 5.2  07/06/2017   HGB 11.2 (L) 07/06/2017   HCT 35.9 (L) 07/06/2017   MCV 65.1 (L) 07/06/2017   PLT 326 07/06/2017    Lab Results  Component Value Date   CREATININE 1.14 07/07/2017    No results found for: PSA  No results found for: TESTOSTERONE  No results found for: HGBA1C  No results found for: TSH  No results found for: CHOL, HDL, CHOLHDL, VLDL, LDLCALC  Lab Results  Component Value Date   AST 23 07/05/2017   Lab Results  Component Value Date   ALT 17 07/05/2017   No components found for: ALKALINEPHOPHATASE No components found for: BILIRUBINTOTAL  No results found for: ESTRADIOL  Urinalysis No results found for: COLORURINE, APPEARANCEUR, LABSPEC, PHURINE, GLUCOSEU, HGBUR, BILIRUBINUR, KETONESUR, PROTEINUR, UROBILINOGEN, NITRITE, LEUKOCYTESUR  I have reviewed the labs.   Pertinent Imaging: CLINICAL DATA:  Acute renal failure  EXAM: RENAL / URINARY TRACT ULTRASOUND COMPLETE  COMPARISON:  None.  FINDINGS: Right Kidney:  Length: 13 cm. Moderate hydronephrosis is  noted. 3.4 cm cyst is noted in the upper pole.  Left Kidney:  Length: 11.8 cm. Moderate hydronephrosis is noted similar to that seen on the right.  Bladder:  Bladder is well distended. Prostate is mildly prominent. Prevoid bladder volume was 1315 mL and the postvoid volume is 1276 mL  IMPRESSION: Moderate hydronephrosis bilaterally. This may be related to significantly distended bladder. Voiding was attempted and only a minimal amount of urine was passed. This raises suspicion for chronic outlet obstruction.   Electronically Signed   By: Inez Catalina M.D.   On: 07/05/2017 14:30  CLINICAL DATA:  Weakness  EXAM: ABDOMEN - 1 VIEW  COMPARISON:  None.  FINDINGS: Scattered large and small bowel gas is noted. No obstructive changes are seen. Fullness in the bladder is again identified. No acute bony abnormality is seen. Degenerative changes of lumbar spine are  noted.  IMPRESSION: Distended bladder.  No other focal abnormality is noted.   Electronically Signed   By: Inez Catalina M.D.   On: 07/05/2017 14:55  I have independently reviewed the films.    Assessment & Plan:    1. Elevated PSA -Contrast CT of the abdomen and pelvis and bone scan are pending-patient would like then performed next week -Prostate biopsy will be scheduled pending urine culture results  -We will also need to pursue a prostate biopsy -due to his indwelling Foley catheter he is at a high risk for sepsis after the biopsy -we will obtain a urine culture and treat any positive culture with antibiotics prior to the biopsy -we will also given injection of ceftriaxone and gentamicin in the office in place of the Cipro for broader antibiotic bacterial coverage  2. Acute urinary retention:     -patient failed TOV   3. Nodular prostate -worrisome for prostate cancer -obtain a PSA    Return for pending urine culture results.  These notes generated with voice recognition software. I apologize for typographical errors.  Zara Council, [facility] [address], [place], [place] [address] [phone]

## 2017-08-04 ENCOUNTER — Telehealth: Payer: Self-pay | Admitting: Urology

## 2017-08-04 ENCOUNTER — Ambulatory Visit: Payer: Medicare Other | Admitting: Urology

## 2017-08-04 ENCOUNTER — Other Ambulatory Visit: Payer: Self-pay

## 2017-08-04 NOTE — Telephone Encounter (Signed)
Sent message to Scheduling to let them know that this needed to be done next week. They will contact the patient  Sharyn Lull

## 2017-08-04 NOTE — Telephone Encounter (Signed)
Spoke with pt wife in reference to letter for jury duty. Wife voiced understanding.

## 2017-08-04 NOTE — Telephone Encounter (Signed)
He is scheduled for 08-13-17 @ 10:00 for his CT and bone scan  Sharyn Lull

## 2017-08-04 NOTE — Telephone Encounter (Signed)
Would you call Mr. Liskey and let him know that his letter to excuse him from [employer] is up front?

## 2017-08-08 ENCOUNTER — Other Ambulatory Visit: Payer: Self-pay | Admitting: Urology

## 2017-08-08 LAB — CULTURE, URINE COMPREHENSIVE

## 2017-08-08 MED ORDER — SULFAMETHOXAZOLE-TRIMETHOPRIM 800-160 MG PO TABS: 1.0000 | ORAL_TABLET | Freq: Two times a day (BID) | ORAL | 0 refills | Status: DC

## 2017-08-08 NOTE — Progress Notes (Signed)
Septra DS is sent to [facility].

## 2017-08-09 ENCOUNTER — Telehealth: Payer: Self-pay

## 2017-08-09 NOTE — Telephone Encounter (Signed)
-----   Message from Nori Riis, PA-C sent at 08/08/2017  5:22 PM EDT ----- Please let Mr. Cleckler know that his urine culture is positive.  I have sent a prescription for Septra DS, 1 twice daily for 7 days [facility].  We will also need to get him scheduled for a prostate biopsy at this time.

## 2017-08-09 NOTE — Telephone Encounter (Signed)
Spoke with pt wife in reference to +ucx and abx. Wife voiced understanding.  Made wife aware will need to schedule prostate bx. When should this be done? If I am trying to schedule the bx I am not able to get them scheduled until April 2. Does it need to be done before then?

## 2017-08-11 MED ORDER — SULFAMETHOXAZOLE-TRIMETHOPRIM 800-160 MG PO TABS: 1.0000 | ORAL_TABLET | Freq: Two times a day (BID) | ORAL | 0 refills | Status: AC

## 2017-08-11 NOTE — Telephone Encounter (Signed)
Spoke with pt wife in reference to abx until bx and waiting on scan results. Wife voiced understanding.   Wife stated that pt has become dependant on breathing tx that he was given in the hospital. Wife stated that pt is almost out of the nebulizer medication. Wife inquired about a refill via [facility]. Please advise.

## 2017-08-11 NOTE — Telephone Encounter (Signed)
I have no idea how to prescribe nebulizer medication.  He needs to contact his PCP.

## 2017-08-11 NOTE — Telephone Encounter (Signed)
We need to wait on the results of the bone scan and CT scan.  They are going to be done on Friday.  We will have the results by Monday.  He needs to have antibiotic coverage until then, so I think he will need a refill on the Septra.

## 2017-08-12 NOTE — Telephone Encounter (Signed)
Made wife aware.

## 2017-08-13 ENCOUNTER — Encounter
Admission: RE | Admit: 2017-08-13 | Discharge: 2017-08-13 | Disposition: A | Discharge status: Home / Self Care | Payer: Medicare Other | Source: Ambulatory Visit | Attending: Urology | Admitting: Urology

## 2017-08-13 ENCOUNTER — Ambulatory Visit
Admission: RE | Admit: 2017-08-13 | Discharge: 2017-08-13 | Disposition: A | Discharge status: Home / Self Care | Payer: Medicare Other | Source: Ambulatory Visit | Attending: Urology | Admitting: Urology

## 2017-08-13 DIAGNOSIS — C61 Malignant neoplasm of prostate: Secondary | ICD-10-CM | POA: Diagnosis present

## 2017-08-13 MED ORDER — IOPAMIDOL (ISOVUE-300) INJECTION 61%
100.0000 mL | Freq: Once | INTRAVENOUS | Status: AC | PRN
  Administered 2017-08-13: 100 mL via INTRAVENOUS

## 2017-08-13 MED ORDER — TECHNETIUM TC 99M MEDRONATE IV KIT
22.0000 | PACK | Freq: Once | INTRAVENOUS | Status: AC | PRN
  Administered 2017-08-13: 22 via INTRAVENOUS

## 2017-08-16 ENCOUNTER — Ambulatory Visit (INDEPENDENT_AMBULATORY_CARE_PROVIDER_SITE_OTHER): Payer: Medicare Other | Admitting: Urology

## 2017-08-16 ENCOUNTER — Other Ambulatory Visit: Payer: Self-pay

## 2017-08-16 ENCOUNTER — Encounter: Payer: Self-pay | Admitting: Urology

## 2017-08-16 VITALS — BP 124/65 | HR 103 | Ht 72.0 in | Wt 238.8 lb

## 2017-08-16 DIAGNOSIS — R972 Elevated prostate specific antigen [PSA]: Secondary | ICD-10-CM | POA: Diagnosis not present

## 2017-08-16 DIAGNOSIS — N402 Nodular prostate without lower urinary tract symptoms: Secondary | ICD-10-CM | POA: Diagnosis not present

## 2017-08-16 DIAGNOSIS — R338 Other retention of urine: Secondary | ICD-10-CM

## 2017-08-16 DIAGNOSIS — C61 Malignant neoplasm of prostate: Secondary | ICD-10-CM | POA: Diagnosis not present

## 2017-08-16 MED ORDER — DEGARELIX ACETATE 120 MG ~~LOC~~ SOLR
240.0000 mg | Freq: Once | SUBCUTANEOUS | Status: AC
  Administered 2017-08-16: 240 mg via SUBCUTANEOUS

## 2017-08-16 NOTE — Progress Notes (Signed)
08/16/2017 8:29 PM   Christopher Mendez June 07, 1950 [id number]  Referring provider: Center, [facility] [place], [place] [address]  Chief Complaint  Patient presents with  . Urinary Retention    Rsults     HPI: 67 year old Caucasian male with clinical prostate cancer and urinary retention who presents today to discuss his CT and bone scan results and begin [place].    Background history 67 year old Caucasian male with urinary retention and an abnormal prostate exam who presents today for a TOV after hospitalization for ARF due to retention with his wife, Christopher Mendez.  He states he had no issues with urination until a few weeks prior to his ED presentation.   He was started to experience frequency, urgency, dysuria, nocturia, incontinence, intermittency, hesitancy, straining to urinate and a weak urinary stream.  Blood work in the ED noted a rise in his creatinine to 4.74.  RUS performed on 07/05/2017 noted moderate hydronephrosis bilaterally.  A Foley was inserted and 1400 cc of urine was obtained.  Creatinine normalized to 1.14 upon discharge,  He was discharged with a Foley.  Creatinine was 1.14 upon discharge.  His prostate exam was abnormal. He had never seen an urologist.  He has not had previous PSA's or DRE's.  He states that prior to his emergency room presentation he was having urinary frequency, urgency, dysuria, nocturia, incontinence, intermittency, hesitancy, straining to urinate and a weak urinary stream.  Prior to this, he states he was having no difficulty with urination.  He states he does have erectile dysfunction.  Patient denies any gross hematuria, dysuria or suprapubic/flank pain.  Patient denies any fevers, chills, nausea or vomiting.   08/03/2017 visit Last week he failed his voiding trial.  He and his wife do remember that prior to the hospitalization he did have a PSA drawn at his primary care physician's office.  The PSA on July 04, 2017  was 41.6 ng/mL.   Recent PSA 56.7.    Contrast CT on 08/13/2017 demonstrated an irregular mass extending off the peripheral aspect of the prostate gland and appearing to involve the adjacent base of the urinary bladder, most compatible with primary prostate carcinoma.  Indeterminate sclerotic lesion within the left femoral neck and L3 vertebral body. Osseous metastatic lesions are not entirely excluded.  No retroperitoneal or pelvic lymphadenopathy.   Bone scan on 08/13/2017 demonstrated no scintigraphic evidence of osseous metastatic disease.  Today, he is tolerating the Foley catheter.  Patient denies any gross hematuria, dysuria or suprapubic/flank pain.  Patient denies any fevers, chills, nausea or vomiting.   PMH: Past Medical History:  Diagnosis Date  . Cardiac enlargement   . COPD (chronic obstructive pulmonary disease) ([place])   . HTN (hypertension)     Surgical History: None.    Home Medications:  Allergies as of 08/16/2017   No Known Allergies     Medication List        Accurate as of 08/16/17 11:59 PM. Always use your most recent med list.          albuterol 108 (90 Base) MCG/ACT inhaler Commonly known as:  PROVENTIL HFA;VENTOLIN HFA Inhale 1 puff into the lungs every 6 (six) hours as needed for wheezing or shortness of breath.   Fluticasone-Salmeterol 500-50 MCG/DOSE Aepb Commonly known as:  ADVAIR Inhale 1 puff into the lungs 2 (two) times daily.   guaiFENesin 600 MG 12 hr tablet Commonly known as:  MUCINEX Take 1 tablet (600 mg total)  by mouth 2 (two) times daily.   ipratropium-albuterol 0.5-2.5 (3) MG/3ML Soln Commonly known as:  DUONEB Take 3 mLs by nebulization every 6 (six) hours as needed (wheezing, shortness of breath).   lisinopril 40 MG tablet Commonly known as:  PRINIVIL,ZESTRIL Take 40 mg by mouth daily.   nicotine 21 mg/24hr patch Commonly known as:  NICODERM CQ - dosed in mg/24 hours Place 1 patch (21 mg total) onto the skin daily.     predniSONE 10 MG (21) Tbpk tablet Commonly known as:  STERAPRED UNI-PAK 21 TAB 6 tabs PO x 1 day 5 tabs PO x 1 day 4 tabs PO x 1 day 3 tabs PO x 1 day 2 tabs PO x 1 day 1 tab PO x 1 day and stop   sulfamethoxazole-trimethoprim 800-160 MG tablet Commonly known as:  BACTRIM DS,SEPTRA DS Take 1 tablet by mouth 2 (two) times daily for 8 days.   tamsulosin 0.4 MG Caps capsule Commonly known as:  FLOMAX Take 1 capsule (0.4 mg total) by mouth daily.   tiotropium 18 MCG inhalation capsule Commonly known as:  SPIRIVA Place 18 mcg into inhaler and inhale daily.       Allergies: No Known Allergies  Family History: Family History  Problem Relation Age of Onset  . Hypertension Mother   . Diabetes Father     Social History:  reports that he has been smoking cigarettes.  He has a 56.00 pack-year smoking history. He has never used smokeless tobacco. He reports that he drinks about 25.2 oz of alcohol per week. He reports that he does not use drugs.  ROS: UROLOGY Frequent Urination?: No Hard to postpone urination?: No Burning/pain with urination?: No Get up at night to urinate?: No Leakage of urine?: No Urine stream starts and stops?: No Trouble starting stream?: No Do you have to strain to urinate?: No Blood in urine?: No Urinary tract infection?: No Sexually transmitted disease?: No Injury to kidneys or bladder?: No Painful intercourse?: No Weak stream?: No Erection problems?: No Penile pain?: No  Gastrointestinal Nausea?: No Vomiting?: No Indigestion/heartburn?: No Diarrhea?: No Constipation?: Yes  Constitutional Fever: No Night sweats?: No Weight loss?: No Fatigue?: Yes  Skin Skin rash/lesions?: No Itching?: No  Eyes Blurred vision?: No Double vision?: No  Ears/Nose/Throat Sore throat?: No Sinus problems?: No  Hematologic/Lymphatic Swollen glands?: No Easy bruising?: No  Cardiovascular Leg swelling?: No Chest pain?: No  Respiratory Cough?:  No Shortness of breath?: Yes  Endocrine Excessive thirst?: No  Musculoskeletal Back pain?: Yes Joint pain?: No  Neurological Headaches?: Yes Dizziness?: No  Psychologic Depression?: No Anxiety?: Yes  Physical Exam: BP 124/65 (BP Location: Right Arm)   Pulse (!) 103   Ht 6' (1.829 m)   Wt 238 lb 12.8 oz (108.3 kg)   BMI 32.39 kg/m   Constitutional: Well nourished. Alert and oriented, No acute distress. HEENT: [place] AT, moist mucus membranes. Trachea midline, no masses. Cardiovascular: No clubbing, cyanosis, or edema. Respiratory: Normal respiratory effort, no increased work of breathing. Skin: No rashes, bruises or suspicious lesions. Lymph: No cervical or inguinal adenopathy. Neurologic: Grossly intact, no focal deficits, moving all 4 extremities. Psychiatric: Normal mood and affect.   Laboratory Data: PSA History 41.6 in 06/2017 56.7 in 07/2017  Lab Results  Component Value Date   WBC 5.2 07/06/2017   HGB 11.2 (L) 07/06/2017   HCT 35.9 (L) 07/06/2017   MCV 65.1 (L) 07/06/2017   PLT 326 07/06/2017    Lab Results  Component  Value Date   CREATININE 1.14 07/07/2017    No results found for: PSA  No results found for: TESTOSTERONE  No results found for: HGBA1C  No results found for: TSH  No results found for: CHOL, HDL, CHOLHDL, VLDL, LDLCALC  Lab Results  Component Value Date   AST 23 07/05/2017   Lab Results  Component Value Date   ALT 17 07/05/2017   No components found for: ALKALINEPHOPHATASE No components found for: BILIRUBINTOTAL  No results found for: ESTRADIOL  Urinalysis No results found for: COLORURINE, APPEARANCEUR, LABSPEC, PHURINE, GLUCOSEU, HGBUR, BILIRUBINUR, KETONESUR, PROTEINUR, UROBILINOGEN, NITRITE, LEUKOCYTESUR  I have reviewed the labs.   Pertinent Imaging: ADDENDUM REPORT: 08/24/2017 08:10  ADDENDUM: Addendum for correction of findings:  Gallbladder is normal in appearance. No intrahepatic or  extrahepatic biliary ductal dilatation. No surrounding inflammatory stranding.   Electronically Signed   By: Lovey Newcomer M.D.   On: 08/24/2017 08:10   Addended by Lovey Newcomer, MD on 08/24/2017 8:12 AM    Study Result   CLINICAL DATA:  Patient with history of prostate cancer. Staging evaluation.  EXAM: CT ABDOMEN AND PELVIS WITH CONTRAST  TECHNIQUE: Multidetector CT imaging of the abdomen and pelvis was performed using the standard protocol following bolus administration of intravenous contrast.  CONTRAST:  110mL ISOVUE-300 IOPAMIDOL (ISOVUE-300) INJECTION 61%  COMPARISON:  None.  FINDINGS: Lower chest: Normal heart size. Dependent atelectasis within the bilateral lower lobes. No pleural effusion.  Hepatobiliary: The liver is normal in size and contour. No focal hepatic lesion is identified. Gallbladder surgically absent. No intrahepatic or extrahepatic biliary ductal dilatation.  Pancreas: Unremarkable  Spleen: Unremarkable  Adrenals/Urinary Tract: Adrenal glands are normal. Kidneys enhance symmetrically with contrast. There is a 2.8 cm simple cyst within the interpolar region of the right kidney. Marked wall thickening of the urinary bladder. There is heterogeneous enhancing masslike tissue involving the posterior left urinary bladder wall (image 77; series 2) measuring up to 2.5 cm in thickness.  Stomach/Bowel: Small hiatal hernia. Normal morphology of the stomach. No evidence for small bowel obstruction. No free fluid or free intraperitoneal air. Normal appendix.  Vascular/Lymphatic: Normal caliber abdominal aorta. Peripheral calcified atherosclerotic plaque. No retroperitoneal or pelvic lymphadenopathy. Prominent subcentimeter lymph nodes are demonstrated.  Reproductive: Irregular masslike thickening involving the superior and left aspect of the prostate gland, a portion of which appears contiguous with the posterior left bladder  base.  Other: Small fat containing inguinal hernias bilaterally.  Musculoskeletal: Indeterminate 1.2 cm sclerotic lesion within the left femoral neck (image 85; series 2). Indeterminate sclerotic lesion within the L3 vertebral body measuring 0.9 cm (image 62; series 7). 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  IMPRESSION: 1. Irregular mass extending off the peripheral aspect of the prostate gland and appearing to involve the adjacent base of the urinary bladder, most compatible with primary prostate carcinoma. 2. Indeterminate sclerotic lesion within the left femoral neck and L3 vertebral body. Osseous metastatic lesions are not entirely excluded. 3. No retroperitoneal or pelvic lymphadenopathy.  Electronically Signed: By: Lovey Newcomer M.D. On: 08/13/2017 14:15       CLINICAL DATA:  Prostate cancer, high risk, staging, history urethral cancer, COPD, hypertension  EXAM: NUCLEAR MEDICINE WHOLE BODY BONE SCAN  TECHNIQUE: Whole body anterior and posterior images were obtained approximately 3 hours after intravenous injection of radiopharmaceutical.  RADIOPHARMACEUTICALS:  23.446 mCi Technetium-54m MDP IV  COMPARISON:  None  Correlation: CT abdomen and pelvis 08/13/2017  FINDINGS: Mild uptake at the shoulders and sternoclavicular  joints typically degenerative.  Uptake at the paranasal sinus region, nonspecific.  No concerning areas of abnormal osseous tracer accumulation are identified to suggest osseous metastatic disease.  Urinary tract and soft tissue distribution of tracer unremarkable.  IMPRESSION: No scintigraphic evidence of osseous metastatic disease.   Electronically Signed   By: Lavonia Dana M.D.   On: 08/13/2017 21:44  I have independently reviewed the films.    Assessment & Plan:    1. Elevated PSA (clinical prostate cancer) Bone scan and contrast CT are negative for metastasis Firmagon 240 mg given today  -  RTC in 28 days for next injection Patient still needs to have a prostate biopsy for pathology, but with indwelling Foley the risk of infection with sepsis is increased greatly  We are working towards patient being able to void spontaneously and removal of the Foley Urine culture and treat any positive culture with antibiotics prior to the biopsy -we will also given injection of ceftriaxone and gentamicin in the office in place of the Cipro for broader antibiotic bacterial coverage  2. Acute urinary retention:     -patient failed TOV - indwelling Foley remains  - patient to RTC for another attempt for TOV   3. Nodular prostate -worrisome for prostate cancer  -ultimate plan for a prostate biopsy   Return for 08/26/2017 for TOV and PVR .  These notes generated with voice recognition software. I apologize for typographical errors.  Zara Council, [facility] [address] [place], [place] [address] [phone]

## 2017-08-16 NOTE — Patient Instructions (Signed)
Take 600 mg Calcium and 200 mg Vitamin D twice daily with food

## 2017-08-19 ENCOUNTER — Telehealth: Payer: Self-pay

## 2017-08-19 NOTE — Telephone Encounter (Signed)
Spoke w/ pt wife about CT report.

## 2017-08-19 NOTE — Telephone Encounter (Signed)
I looked at his CT and his gallbladder is present.  There is also a Foley catheter in place.  I will speak with the radiologist who read the film and get this corrected.

## 2017-08-19 NOTE — Telephone Encounter (Signed)
Pt and wife, [name], called with questions about the CT report. [name] stated that report says pt gallbladder was absent due to surgically being removed. Pt and [name] both stated that pt has never had his gallbladder removed. Both had concerns of this and questioned if the reports got mixed up. Please advise.

## 2017-08-25 DIAGNOSIS — C61 Malignant neoplasm of prostate: Secondary | ICD-10-CM | POA: Diagnosis not present

## 2017-08-25 MED ORDER — DEGARELIX ACETATE 120 MG ~~LOC~~ SOLR
240.0000 mg | Freq: Once | SUBCUTANEOUS | Status: AC
Start: 2017-08-25 — End: 2017-08-25
  Administered 2017-08-25: 240 mg via SUBCUTANEOUS

## 2017-08-25 NOTE — Progress Notes (Signed)
Firmagon Sub Q Injection  Due to Prostate Cancer patient is present today for a Firmagon Injection.   Medication: Mills Koller (Degarelix)  Dose: 240mg  Location: right and left upper abdomen Lot: [id number] Exp: 12/2017  Patient tolerated well, no complications noted.  Performed by: Donnie Mesa, [place], LPN

## 2017-08-25 NOTE — Progress Notes (Signed)
08/26/2017 11:25 AM   Christopher Mendez July 15, 1950 [id number]  Referring provider: Center, [facility] [place], [place] [address]  Chief Complaint  Patient presents with  . Urinary Retention    HPI: 67 year old Caucasian male with clinical prostate cancer and urinary retention who presents today for a TOV.    Background history 67 year old Caucasian male with urinary retention and an abnormal prostate exam who presents today for a TOV after hospitalization for ARF due to retention with his wife, Christopher Mendez.  He states he had no issues with urination until a few weeks prior to his ED presentation.   He was started to experience frequency, urgency, dysuria, nocturia, incontinence, intermittency, hesitancy, straining to urinate and a weak urinary stream.  Blood work in the ED noted a rise in his creatinine to 4.74.  RUS performed on 07/05/2017 noted moderate hydronephrosis bilaterally.  A Foley was inserted and 1400 cc of urine was obtained.  Creatinine normalized to 1.14 upon discharge,  He was discharged with a Foley.  Creatinine was 1.14 upon discharge.  His prostate exam was abnormal. He had never seen an urologist.  He has not had previous PSA's or DRE's.  He states that prior to his emergency room presentation he was having urinary frequency, urgency, dysuria, nocturia, incontinence, intermittency, hesitancy, straining to urinate and a weak urinary stream.  Prior to this, he states he was having no difficulty with urination.  He states he does have erectile dysfunction.  Patient denies any gross hematuria, dysuria or suprapubic/flank pain.  Patient denies any fevers, chills, nausea or vomiting.   08/03/2017 visit Last week he failed his voiding trial.  He and his wife do remember that prior to the hospitalization he did have a PSA drawn at his primary care physician's office.  The PSA on July 04, 2017 was 41.6 ng/mL.   Recent PSA 56.7.    Contrast CT on  08/13/2017 demonstrated an irregular mass extending off the peripheral aspect of the prostate gland and appearing to involve the adjacent base of the urinary bladder, most compatible with primary prostate carcinoma.  Indeterminate sclerotic lesion within the left femoral neck and L3 vertebral body. Osseous metastatic lesions are not entirely excluded.  No retroperitoneal or pelvic lymphadenopathy.   Bone scan on 08/13/2017 demonstrated no scintigraphic evidence of osseous metastatic disease.  08/16/2017 visit  Today, he is tolerating the Foley catheter.  Patient denies any gross hematuria, dysuria or suprapubic/flank pain.  Patient denies any fevers, chills, nausea or vomiting.   It was removed and he returned later that day for Foley replacement.    Today, he presented this morning for a TOV.   He is tolerated his Foley.  He is taking his Flomax daily.  He is currently on Bactrim for a positive urine culture from 08/03/2017 for Staphylococcus epidermidis.  He still has some soreness in the area of the [place] injections with raised areas.  He has not experienced hot flashes.    PMH: Past Medical History:  Diagnosis Date  . Cardiac enlargement   . COPD (chronic obstructive pulmonary disease) ([place])   . HTN (hypertension)     Surgical History: None.    Home Medications:  Allergies as of 08/26/2017   No Known Allergies     Medication List        Accurate as of 08/26/17 11:59 PM. Always use your most recent med list.          albuterol 108 (  90 Base) MCG/ACT inhaler Commonly known as:  PROVENTIL HFA;VENTOLIN HFA Inhale 1 puff into the lungs every 6 (six) hours as needed for wheezing or shortness of breath.   Fluticasone-Salmeterol 500-50 MCG/DOSE Aepb Commonly known as:  ADVAIR Inhale 1 puff into the lungs 2 (two) times daily.   ipratropium-albuterol 0.5-2.5 (3) MG/3ML Soln Commonly known as:  DUONEB Take 3 mLs by nebulization every 6 (six) hours as needed (wheezing, shortness of  breath).   lisinopril 40 MG tablet Commonly known as:  PRINIVIL,ZESTRIL Take 40 mg by mouth daily.   tamsulosin 0.4 MG Caps capsule Commonly known as:  FLOMAX Take 1 capsule (0.4 mg total) by mouth daily.   tiotropium 18 MCG inhalation capsule Commonly known as:  SPIRIVA Place 18 mcg into inhaler and inhale daily.       Allergies: No Known Allergies  Family History: Family History  Problem Relation Age of Onset  . Hypertension Mother   . Diabetes Father     Social History:  reports that he has been smoking cigarettes.  He has a 56.00 pack-year smoking history. He has never used smokeless tobacco. He reports that he drinks about 25.2 oz of alcohol per week. He reports that he does not use drugs.  ROS: UROLOGY Frequent Urination?: No Hard to postpone urination?: No Burning/pain with urination?: No Get up at night to urinate?: No Leakage of urine?: No Urine stream starts and stops?: No Trouble starting stream?: No Do you have to strain to urinate?: No Blood in urine?: No Urinary tract infection?: No Sexually transmitted disease?: No Injury to kidneys or bladder?: No Painful intercourse?: No Weak stream?: No Erection problems?: No Penile pain?: No  Gastrointestinal Nausea?: No Vomiting?: No Indigestion/heartburn?: No Diarrhea?: No Constipation?: Yes  Constitutional Fever: No Night sweats?: No Weight loss?: No Fatigue?: Yes  Skin Skin rash/lesions?: No Itching?: No  Eyes Blurred vision?: No Double vision?: No  Ears/Nose/Throat Sore throat?: No Sinus problems?: No  Hematologic/Lymphatic Swollen glands?: Yes Easy bruising?: No  Cardiovascular Leg swelling?: No Chest pain?: No  Respiratory Cough?: Yes Shortness of breath?: Yes  Endocrine Excessive thirst?: No  Musculoskeletal Back pain?: Yes Joint pain?: No  Neurological Headaches?: No Dizziness?: No  Psychologic Depression?: No Anxiety?: Yes  Physical Exam: BP 131/69    Pulse 84   Wt 233 lb (105.7 kg)   BMI 31.60 kg/m   Constitutional: Well nourished. Alert and oriented, No acute distress. HEENT: Blanket AT, moist mucus membranes. Trachea midline, no masses. Cardiovascular: No clubbing, cyanosis, or edema. Respiratory: Normal respiratory effort, no increased work of breathing. Skin: No rashes, bruises or suspicious lesions. Lymph: No cervical or inguinal adenopathy. Neurologic: Grossly intact, no focal deficits, moving all 4 extremities. Psychiatric: Normal mood and affect.   Laboratory Data: PSA History 41.6 in 06/2017 56.7 in 07/2017  Lab Results  Component Value Date   WBC 5.2 07/06/2017   HGB 11.2 (L) 07/06/2017   HCT 35.9 (L) 07/06/2017   MCV 65.1 (L) 07/06/2017   PLT 326 07/06/2017    Lab Results  Component Value Date   CREATININE 1.14 07/07/2017    No results found for: PSA  No results found for: TESTOSTERONE  No results found for: HGBA1C  No results found for: TSH  No results found for: CHOL, HDL, CHOLHDL, VLDL, LDLCALC  Lab Results  Component Value Date   AST 23 07/05/2017   Lab Results  Component Value Date   ALT 17 07/05/2017   No components found for: ALKALINEPHOPHATASE No  components found for: BILIRUBINTOTAL  No results found for: ESTRADIOL  Urinalysis No results found for: COLORURINE, APPEARANCEUR, LABSPEC, [place], GLUCOSEU, [place], BILIRUBINUR, KETONESUR, PROTEINUR, UROBILINOGEN, NITRITE, LEUKOCYTESUR  I have reviewed the labs.   Pertinent Imaging: ADDENDUM REPORT: 08/24/2017 08:10  ADDENDUM: Addendum for correction of findings:  Gallbladder is normal in appearance. No intrahepatic or extrahepatic biliary ductal dilatation. No surrounding inflammatory stranding.   Electronically Signed   By: Lovey Newcomer M.D.   On: 08/24/2017 08:10   Addended by Lovey Newcomer, MD on 08/24/2017 8:12 AM    Study Result   CLINICAL DATA:  Patient with history of prostate cancer.  Staging evaluation.  EXAM: CT ABDOMEN AND PELVIS WITH CONTRAST  TECHNIQUE: Multidetector CT imaging of the abdomen and pelvis was performed using the standard protocol following bolus administration of intravenous contrast.  CONTRAST:  127mL ISOVUE-300 IOPAMIDOL (ISOVUE-300) INJECTION 61%  COMPARISON:  None.  FINDINGS: Lower chest: Normal heart size. Dependent atelectasis within the bilateral lower lobes. No pleural effusion.  Hepatobiliary: The liver is normal in size and contour. No focal hepatic lesion is identified. Gallbladder surgically absent. No intrahepatic or extrahepatic biliary ductal dilatation.  Pancreas: Unremarkable  Spleen: Unremarkable  Adrenals/Urinary Tract: Adrenal glands are normal. Kidneys enhance symmetrically with contrast. There is a 2.8 cm simple cyst within the interpolar region of the right kidney. Marked wall thickening of the urinary bladder. There is heterogeneous enhancing masslike tissue involving the posterior left urinary bladder wall (image 77; series 2) measuring up to 2.5 cm in thickness.  Stomach/Bowel: Small hiatal hernia. Normal morphology of the stomach. No evidence for small bowel obstruction. No free fluid or free intraperitoneal air. Normal appendix.  Vascular/Lymphatic: Normal caliber abdominal aorta. Peripheral calcified atherosclerotic plaque. No retroperitoneal or pelvic lymphadenopathy. Prominent subcentimeter lymph nodes are demonstrated.  Reproductive: Irregular masslike thickening involving the superior and left aspect of the prostate gland, a portion of which appears contiguous with the posterior left bladder base.  Other: Small fat containing inguinal hernias bilaterally.  Musculoskeletal: Indeterminate 1.2 cm sclerotic lesion within the left femoral neck (image 85; series 2). Indeterminate sclerotic lesion within the L3 vertebral body measuring 0.9 cm (image 62; series 7). Multilevel  degenerative disc disease of the lumbar spine.  IMPRESSION: 1. Irregular mass extending off the peripheral aspect of the prostate gland and appearing to involve the adjacent base of the urinary bladder, most compatible with primary prostate carcinoma. 2. Indeterminate sclerotic lesion within the left femoral neck and L3 vertebral body. Osseous metastatic lesions are not entirely excluded. 3. No retroperitoneal or pelvic lymphadenopathy.  Electronically Signed: By: Lovey Newcomer M.D. On: 08/13/2017 14:15       CLINICAL DATA:  Prostate cancer, high risk, staging, history urethral cancer, COPD, hypertension  EXAM: NUCLEAR MEDICINE WHOLE BODY BONE SCAN  TECHNIQUE: Whole body anterior and posterior images were obtained approximately 3 hours after intravenous injection of radiopharmaceutical.  RADIOPHARMACEUTICALS:  23.446 mCi Technetium-28m MDP IV  COMPARISON:  None  Correlation: CT abdomen and pelvis 08/13/2017  FINDINGS: Mild uptake at the shoulders and sternoclavicular joints typically degenerative.  Uptake at the paranasal sinus region, nonspecific.  No concerning areas of abnormal osseous tracer accumulation are identified to suggest osseous metastatic disease.  Urinary tract and soft tissue distribution of tracer unremarkable.  IMPRESSION: No scintigraphic evidence of osseous metastatic disease.   Electronically Signed   By: Lavonia Dana M.D.   On: 08/13/2017 21:44  Assessment & Plan:    1. Elevated PSA (clinical prostate cancer) Bone  scan and contrast CT are negative for metastasis Firmagon 240 mg given on 08/16/2017   - RTC in 28 days for next injection Patient still needs to have a prostate biopsy for pathology, but with indwelling Foley the risk of infection with sepsis is increased greatly  We are working towards patient being able to void spontaneously and removal of the Foley Urine culture and treat any positive culture with  antibiotics prior to the biopsy -we will also given injection of ceftriaxone and gentamicin in the office in place of the Cipro for broader antibiotic bacterial coverage We will need to check his PSA and testosterone levels when he returns for his next Firmagon injection  2. Acute urinary retention:     -patient failed TOV - indwelling Foley remains  - patient to RTC for another attempt for TOV with his second injection of Firmagon   3. Nodular prostate -worrisome for prostate cancer  -ultimate plan for a prostate biopsy   Return for keep appointment on 09/14/2017 for [place] and TOV and blood work.  These notes generated with voice recognition software. I apologize for typographical errors.  Zara Council, [facility] [address] [place], [place] [address] [phone]

## 2017-08-26 ENCOUNTER — Ambulatory Visit (INDEPENDENT_AMBULATORY_CARE_PROVIDER_SITE_OTHER): Payer: Medicare Other | Admitting: Urology

## 2017-08-26 ENCOUNTER — Other Ambulatory Visit: Payer: Self-pay | Admitting: Urology

## 2017-08-26 ENCOUNTER — Encounter: Payer: Self-pay | Admitting: Urology

## 2017-08-26 VITALS — BP 131/69 | HR 84 | Ht 72.0 in | Wt 233.0 lb

## 2017-08-26 DIAGNOSIS — R972 Elevated prostate specific antigen [PSA]: Secondary | ICD-10-CM | POA: Diagnosis not present

## 2017-08-26 DIAGNOSIS — R338 Other retention of urine: Secondary | ICD-10-CM

## 2017-08-26 DIAGNOSIS — N402 Nodular prostate without lower urinary tract symptoms: Secondary | ICD-10-CM | POA: Diagnosis not present

## 2017-08-26 DIAGNOSIS — C61 Malignant neoplasm of prostate: Secondary | ICD-10-CM | POA: Diagnosis not present

## 2017-08-26 LAB — BLADDER SCAN AMB NON-IMAGING: SCAN RESULT: 489

## 2017-08-26 NOTE — Progress Notes (Signed)
Simple Catheter Placement  Due to urinary retention patient is present today for a foley cath placement.  Patient was cleaned and prepped in a sterile fashion with betadine and lidocaine jelly 2% was instilled into the urethra.  A 18 FR coude catheter was inserted, urine return was noted  435ml, urine was yellow in color.  The balloon was filled with 10cc of sterile water.  A leg bag was attached for drainage.   Patient was given instruction on proper catheter care.  Patient tolerated well, no complications were noted   Preformed by: Elberta Leatherwood, CMA

## 2017-09-01 ENCOUNTER — Other Ambulatory Visit: Payer: Self-pay

## 2017-09-01 MED ORDER — SULFAMETHOXAZOLE-TRIMETHOPRIM 800-160 MG PO TABS: 1.0000 | ORAL_TABLET | Freq: Two times a day (BID) | ORAL | 0 refills | Status: AC

## 2017-09-09 ENCOUNTER — Telehealth: Payer: Self-pay | Admitting: Urology

## 2017-09-09 NOTE — Telephone Encounter (Signed)
Mr. Christopher Mendez is coming in on 09/14/2017 for his next [place] injection and TOV.  He will need to have his PSA and testosterone levels drawn at that appointment as well.

## 2017-09-13 ENCOUNTER — Ambulatory Visit: Payer: Medicare Other | Admitting: Urology

## 2017-09-14 ENCOUNTER — Ambulatory Visit (INDEPENDENT_AMBULATORY_CARE_PROVIDER_SITE_OTHER): Payer: Medicare Other

## 2017-09-14 ENCOUNTER — Telehealth: Payer: Self-pay

## 2017-09-14 VITALS — BP 116/67 | HR 89 | Ht 73.0 in | Wt 235.0 lb

## 2017-09-14 DIAGNOSIS — C61 Malignant neoplasm of prostate: Secondary | ICD-10-CM

## 2017-09-14 MED ORDER — DEGARELIX ACETATE 80 MG ~~LOC~~ SOLR
80.0000 mg | Freq: Once | SUBCUTANEOUS | Status: AC
  Administered 2017-09-14: 80 mg via SUBCUTANEOUS

## 2017-09-14 NOTE — Telephone Encounter (Signed)
Pt was advised via [place] to stay on keflex until prostate bx. Pt stated at nurse visit today that he will be out of medication this week. Do you want pt to continue abx?  Pt did not schedule prostate bx yet to ensure he can continue CIC. If not, will RTC for foley replacement.

## 2017-09-14 NOTE — Progress Notes (Signed)
Pt presents today for V&T, firmagon injection, and labs-PSA,testosterone. Foley was removed and pt was instructed to RTC today by 3pm for bladder scan. Pt voiced understanding.  Firmagon Sub Q Injection  Due to Prostate Cancer patient is present today for a Firmagon Injection.   Medication: Mills Koller (Degarelix)  Dose: 80mg  Location: left upper abdomen Lot: [id number] Exp: 10/2018  Patient tolerated well, no complications were noted  Performed by: Toniann Fail, LPN   Blood pressure 116/67, pulse 89, height 6\' 1"  (1.854 m), weight 235 lb (106.6 kg).  PVR: 209  Continuous Intermittent Catheterization  Due to urinary retention patient is present today for a teaching of self I & O Catheterization. Patient was given detailed verbal and printed instructions of self catheterization. Patient was cleaned and prepped in a sterile fashion.  With instruction and assistance patient inserted a 16FR and urine return was noted 220 ml, urine was amber in color. Patient tolerated well, no complications were noted Patient was given a sample bag with supplies to take home.  Instructions were given per Dr. Erlene Quan for patient to cath up to 8 times daily.  An order was placed with [employer] for catheters to be sent to the patient's home. Patient is to follow up for prostate bx next available.  Preformed by: Toniann Fail, LPN

## 2017-09-15 LAB — PSA: PROSTATE SPECIFIC AG, SERUM: 9.7 ng/mL — AB (ref 0.0–4.0)

## 2017-09-15 LAB — TESTOSTERONE: TESTOSTERONE: 20 ng/dL — AB (ref 264–916)

## 2017-09-15 NOTE — Telephone Encounter (Signed)
Spoke with pt wife in reference to abx. Wife also stated that CIC is going great and pt is able to tolerate it well. Will have Sharyn Lull make bx appt and call pt back. Wife voiced understanding of whole conversation.

## 2017-09-15 NOTE — Telephone Encounter (Signed)
I do not think this is necessary.  If he is able to self cath, get him on the schedule for next week for prostate biopsy.  Hollice Espy, MD

## 2017-09-23 ENCOUNTER — Ambulatory Visit (INDEPENDENT_AMBULATORY_CARE_PROVIDER_SITE_OTHER): Payer: Medicare Other | Admitting: Urology

## 2017-09-23 ENCOUNTER — Other Ambulatory Visit: Payer: Self-pay | Admitting: Urology

## 2017-09-23 ENCOUNTER — Encounter: Payer: Self-pay | Admitting: Urology

## 2017-09-23 VITALS — BP 106/60 | HR 96 | Wt 233.0 lb

## 2017-09-23 DIAGNOSIS — C61 Malignant neoplasm of prostate: Secondary | ICD-10-CM | POA: Diagnosis not present

## 2017-09-23 MED ORDER — CEFTRIAXONE SODIUM 1 G IJ SOLR: 1.0000 g | INTRAMUSCULAR | Status: DC

## 2017-09-23 MED ORDER — GENTAMICIN SULFATE 40 MG/ML IJ SOLN
80.0000 mg | Freq: Once | INTRAMUSCULAR | Status: AC
  Administered 2017-09-23: 80 mg via INTRAMUSCULAR

## 2017-09-23 MED ORDER — CEFTRIAXONE SODIUM 1 G IJ SOLR
1.0000 g | Freq: Once | INTRAMUSCULAR | Status: AC
  Administered 2017-09-23: 1 g via INTRAMUSCULAR

## 2017-09-23 NOTE — Progress Notes (Signed)
   09/23/17  CC:  Chief Complaint  Patient presents with  . Prostate Biopsy    HPI: 67 year old male with markedly elevated PSA to 56.7, rockhard nodular prostate consistent with clinical prostate cancer.  He underwent staging with sclerotic lesions to the femoral neck as well as the L3 vertebral body unable to exclude osseous metastatic disease  on CT scan.  His bone scan was negative.  He is subsequently been started on ADT for clinical dx prostate cancer.    He is also in urinary retention and is unable to void spontaneously.  He has been on clean intermittent catheterization since that time.  No recent urinary tract infections, fevers or chills.  Urine culture was sent today as a precaution.   Blood pressure 106/60, pulse 96, weight 233 lb (105.7 kg). NED. A&Ox3.   No respiratory distress   Abd soft, NT, ND Normal sphincter tone  Prostate Biopsy Procedure   Informed consent was obtained after discussing risks/benefits of the procedure.  A time out was performed to ensure correct patient identity.  Pre-Procedure: - Gentamicin given prophylactically -Ceftriaxone was also given in addition -Transrectal Ultrasound performed revealing a 42.9 gm prostate -Irregular prostate with protrusion into the bladder neck and bladder thickening, diffuse calcifications and hypoechoic lesions, unusual appearing prostate on ultrasound.  Procedure: - Prostate block performed using 10 cc 1% lidocaine and biopsies taken from sextant areas, a total of 12 under ultrasound guidance.  Post-Procedure: - Patient tolerated the procedure well - He was counseled to seek immediate medical attention if experiences any severe pain, significant bleeding, or fevers  - Return in one week to discuss biopsy results -UCx today as precation  Hollice Espy, MD

## 2017-09-27 LAB — CULTURE, URINE COMPREHENSIVE

## 2017-09-29 ENCOUNTER — Other Ambulatory Visit: Payer: Self-pay | Admitting: Urology

## 2017-09-29 LAB — PATHOLOGY REPORT

## 2017-09-30 ENCOUNTER — Telehealth: Payer: Self-pay | Admitting: Family Medicine

## 2017-09-30 NOTE — Telephone Encounter (Signed)
Patient has to Cath indefinitely with a coude catheter due to inability to pass a straight cath.

## 2017-10-07 ENCOUNTER — Encounter: Payer: Self-pay | Admitting: Urology

## 2017-10-07 ENCOUNTER — Ambulatory Visit (INDEPENDENT_AMBULATORY_CARE_PROVIDER_SITE_OTHER): Payer: Medicare Other | Admitting: Urology

## 2017-10-07 VITALS — BP 145/73 | HR 77 | Ht 73.0 in | Wt 233.0 lb

## 2017-10-07 DIAGNOSIS — C61 Malignant neoplasm of prostate: Secondary | ICD-10-CM | POA: Diagnosis not present

## 2017-10-07 DIAGNOSIS — R339 Retention of urine, unspecified: Secondary | ICD-10-CM | POA: Diagnosis not present

## 2017-10-07 NOTE — Progress Notes (Signed)
10/07/2017 11:38 AM   Christopher Mendez November 08, 1950 [id number]  Referring provider: Center, [facility] [address], [place] [address]  Chief Complaint  Patient presents with  . Prostate Cancer    HPI: 67 year old male with locally advanced prostate cancer who returns today to discuss his biopsy results.  He initially presented with a markedly elevated PSA to 56.7 with a rockhard nodular prostate consistent with clinical prostate cancer.  He also was in urinary retention unable to void spontaneously at the time.  He was started on degerelix and underwent multiple failed voiding trials.  Ultimately, he started on CIC and underwent prostate biopsy on 09/23/2017.  This revealed a 12 of 12 cores positive of high risk disease, Gleason 4+5 and 4+4 in all cores involving up to 100% of the tissue.  CT abdomen pelvis with contrast shows an irregular mass extending from the peripheral aspect of the prostate involving the adjacent base of the bladder.  He also had an indeterminate sclerotic lesion of the femoral neck and L3 vertebral body.  There is no significant pelvic adenopathy or any other concerning for metastatic disease.  He underwent bone scan which was unremarkable.  He had no issues following the biopsy.  He continues to CIC 7-8 times daily but started to void spontaneously in the middle of the night over the past week which is new.  He is taking calcium and vitamin D supplements.  He is tried to increase his physical activity.  PMH: Past Medical History:  Diagnosis Date  . Cardiac enlargement   . COPD (chronic obstructive pulmonary disease) ([place])   . HTN (hypertension)     Surgical History: No past surgical history on file.  Home Medications:  Allergies as of 10/07/2017   No Known Allergies     Medication List        Accurate as of 10/07/17 11:38 AM. Always use your most recent med list.          albuterol 108 (90 Base) MCG/ACT  inhaler Commonly known as:  PROVENTIL HFA;VENTOLIN HFA Inhale 1 puff into the lungs every 6 (six) hours as needed for wheezing or shortness of breath.   Fluticasone-Salmeterol 500-50 MCG/DOSE Aepb Commonly known as:  ADVAIR Inhale 1 puff into the lungs 2 (two) times daily.   ipratropium-albuterol 0.5-2.5 (3) MG/3ML Soln Commonly known as:  DUONEB Take 3 mLs by nebulization every 6 (six) hours as needed (wheezing, shortness of breath).   lisinopril 40 MG tablet Commonly known as:  PRINIVIL,ZESTRIL Take 40 mg by mouth daily.   tamsulosin 0.4 MG Caps capsule Commonly known as:  FLOMAX Take 1 capsule (0.4 mg total) by mouth daily.   tiotropium 18 MCG inhalation capsule Commonly known as:  SPIRIVA Place 18 mcg into inhaler and inhale daily.       Allergies: No Known Allergies  Family History: Family History  Problem Relation Age of Onset  . Hypertension Mother   . Diabetes Father     Social History:  reports that he has been smoking cigarettes.  He has a 56.00 pack-year smoking history. He has never used smokeless tobacco. He reports that he drinks about 25.2 oz of alcohol per week. He reports that he does not use drugs.  ROS: UROLOGY Frequent Urination?: No Hard to postpone urination?: No Burning/pain with urination?: No Get up at night to urinate?: No Leakage of urine?: No Urine stream starts and stops?: No Trouble starting stream?: No Do you have to strain to  urinate?: No Blood in urine?: No Urinary tract infection?: No Sexually transmitted disease?: No Injury to kidneys or bladder?: No Painful intercourse?: No Weak stream?: No Erection problems?: No Penile pain?: No  Gastrointestinal Nausea?: No Vomiting?: No Indigestion/heartburn?: No Diarrhea?: No Constipation?: No  Constitutional Fever: No Night sweats?: No Weight loss?: No Fatigue?: Yes  Skin Skin rash/lesions?: Yes Itching?: Yes  Eyes Blurred vision?: No Double vision?:  No  Ears/Nose/Throat Sore throat?: No Sinus problems?: No  Hematologic/Lymphatic Swollen glands?: No Easy bruising?: Yes  Cardiovascular Leg swelling?: No Chest pain?: No  Respiratory Cough?: No Shortness of breath?: Yes  Endocrine Excessive thirst?: No  Musculoskeletal Back pain?: Yes Joint pain?: No  Neurological Headaches?: Yes Dizziness?: Yes  Psychologic Depression?: No Anxiety?: No  Physical Exam: BP (!) 145/73   Pulse 77   Ht 6\' 1"  (1.854 m)   Wt 233 lb (105.7 kg)   BMI 30.74 kg/m   Constitutional:  Alert and oriented, No acute distress.  Accompanied by wife today. HEENT: [place] AT, moist mucus membranes.  Trachea midline, no masses. Cardiovascular: No clubbing, cyanosis, or edema. Respiratory: Normal respiratory effort, no increased work of breathing. Skin: No rashes, bruises or suspicious lesions. Neurologic: Grossly intact, no focal deficits, moving all 4 extremities. Psychiatric: Normal mood and affect.  Laboratory Data: Lab Results  Component Value Date   WBC 5.2 07/06/2017   HGB 11.2 (L) 07/06/2017   HCT 35.9 (L) 07/06/2017   MCV 65.1 (L) 07/06/2017   PLT 326 07/06/2017    Lab Results  Component Value Date   CREATININE 1.14 07/07/2017     Lab Results  Component Value Date   TESTOSTERONE 20 (L) 09/14/2017   Component     Latest Ref Rng & Units 09/14/2017  Prostate Specific Ag, Serum     0.0 - 4.0 ng/mL 9.7 (H)     Urinalysis n/a Pertinent Imaging: CT abdomen pelvis and bone scan were personally reviewed again today.  Assessment & Plan:    1. Prostate cancer ([place]) High risk locally advanced pT3 prost without evidence ofate cancer extensive metastatic disease  He is now status post biopsy confirming the diagnosis, previously being treated for clinical prostate cancer on ADT with PSA trending downward to 9.7 which is reassuring  We discussed his prostate cancer diagnosis at length today.  Given that his staging is negative  other than for locally advanced disease possibly involving the bladder, I do think that radiation should be considered.  We discussed the risk and benefits at length today.  He would like to be referred to Dr. Gerald Stabs to discuss this further.  He will be due for ADT next week, will transition to Lupron with a 67-month injection.  Reviewed the side effects of this medication again today and reiterated recommendations to continue vitamin D and calcium supplementation.  Weightbearing exercises were also recommended.   - Ambulatory referral to Radiation Oncology  2. Urinary retention Continue CIC up to 8 times a day as needed He is actually doing quite well with this and is pleased with the result  We discussed channel TURP, timing to be discussed with Dr. Baruch Gouty.  He may start voiding spontaneously with initiation of ADT and radiation alone with progression of his tumor.    Return in about 6 months (around 04/09/2018) for PSA (return for 6 month Lupron next week) .  Hollice Espy, MD  [facility] [address] [place], [place] [address] [phone]  I spent 25 min  with this patient of which greater than 50% was spent in counseling and coordination of care with the patient.

## 2017-10-15 ENCOUNTER — Ambulatory Visit: Payer: Medicare Other

## 2017-10-15 ENCOUNTER — Other Ambulatory Visit: Payer: Self-pay

## 2017-10-15 ENCOUNTER — Encounter: Payer: Self-pay | Admitting: Radiation Oncology

## 2017-10-15 ENCOUNTER — Ambulatory Visit
Admission: RE | Admit: 2017-10-15 | Discharge: 2017-10-15 | Disposition: A | Discharge status: Home / Self Care | Payer: Medicare Other | Source: Ambulatory Visit | Attending: Radiation Oncology | Admitting: Radiation Oncology

## 2017-10-15 ENCOUNTER — Ambulatory Visit (INDEPENDENT_AMBULATORY_CARE_PROVIDER_SITE_OTHER): Payer: Medicare Other | Admitting: Family Medicine

## 2017-10-15 VITALS — BP 123/60 | HR 85 | Temp 97.6°F | Resp 20 | Wt 234.0 lb

## 2017-10-15 VITALS — BP 138/76 | HR 78 | Ht 73.0 in | Wt 233.0 lb

## 2017-10-15 DIAGNOSIS — Z79899 Other long term (current) drug therapy: Secondary | ICD-10-CM | POA: Insufficient documentation

## 2017-10-15 DIAGNOSIS — F17210 Nicotine dependence, cigarettes, uncomplicated: Secondary | ICD-10-CM | POA: Insufficient documentation

## 2017-10-15 DIAGNOSIS — I517 Cardiomegaly: Secondary | ICD-10-CM | POA: Diagnosis not present

## 2017-10-15 DIAGNOSIS — C61 Malignant neoplasm of prostate: Secondary | ICD-10-CM | POA: Diagnosis not present

## 2017-10-15 DIAGNOSIS — I10 Essential (primary) hypertension: Secondary | ICD-10-CM | POA: Insufficient documentation

## 2017-10-15 DIAGNOSIS — J449 Chronic obstructive pulmonary disease, unspecified: Secondary | ICD-10-CM | POA: Diagnosis not present

## 2017-10-15 MED ORDER — LEUPROLIDE ACETATE (6 MONTH) 45 MG IM KIT
45.0000 mg | PACK | Freq: Once | INTRAMUSCULAR | Status: AC
  Administered 2017-10-15: 45 mg via INTRAMUSCULAR

## 2017-10-15 NOTE — Progress Notes (Signed)
Lupron IM Injection   Due to Prostate Cancer patient is present today for a Lupron Injection.  Medication: Lupron 6 month Dose: 45 mg  Location: right upper outer buttocks Lot: [id number] Exp: 02/16/2020  Patient tolerated well, no complications were noted  Performed by: Elberta Leatherwood, CMA  Follow up: next week for Gold seed placement

## 2017-10-15 NOTE — Consult Note (Signed)
NEW PATIENT EVALUATION  Name: Christopher Mendez  MRN: [id number]  Date:   10/15/2017     DOB: July 01, 1950   This 67 y.o. 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 Prostate Cancer    Pt is here for initial consultation of prostate cancer.     DIAGNOSIS: The encounter diagnosis was Malignant neoplasm of prostate ([place]).   PREVIOUS INVESTIGATIONS:  Bone scan and CT scans reviewed Clinical notes reviewed Pathology reports reviewed  HPI: patient is a 67 year old male who presented with initial PSA 56.7. Digital rectal exam showed a rockhard nodular prostate consistent clinically with adenocarcinoma the prostate. He is currently on self catheterization for voiding.He underwent transrectal ultrasound-guided biopsies showing adenocarcinoma in 12 core sampled a combination of Gleason 9 (4+5) and Gleason 8 (4+4). He had a CT scan of the abdomen showing irregular mass extending from the peripheral aspect of the prostate involving the adjacent bladder base making this a T3 lesion. He also set indeterminate lesions in the femoral neck in L3 vertebral body which was not verified on bone scan. Bone scan was clear.he is been being started on ADT therapy and is now referred to radiation oncology for opinion. He is self catheterizing about 8-10 times a day. He's having no bowel problems tends towards constipation. He has no bone pain.  PLANNED TREATMENT REGIMEN: I MRT radiation therapy along with androgen deprivation therapy treatment  PAST MEDICAL HISTORY:  has a past medical history of Cardiac enlargement, COPD (chronic obstructive pulmonary disease) ([place]), and HTN (hypertension).    PAST SURGICAL HISTORY: History reviewed. No pertinent surgical history.  FAMILY HISTORY: family  history includes Diabetes in his father; Hypertension in his mother.  SOCIAL HISTORY:  reports that he has been smoking cigarettes.  He has a 56.00 pack-year smoking history. He has never used smokeless tobacco. He reports that he drinks about 25.2 oz of alcohol per week. He reports that he does not use drugs.  ALLERGIES: Patient has no known allergies.  MEDICATIONS:  Current Outpatient Medications  Medication Sig Dispense Refill  . albuterol (PROVENTIL HFA;VENTOLIN HFA) 108 (90 Base) MCG/ACT inhaler Inhale 1 puff into the lungs every 6 (six) hours as needed for wheezing or shortness of breath.    . Fluticasone-Salmeterol (ADVAIR) 500-50 MCG/DOSE AEPB Inhale 1 puff into the lungs 2 (two) times daily.    Marland Kitchen ipratropium-albuterol (DUONEB) 0.5-2.5 (3) MG/3ML SOLN Take 3 mLs by nebulization every 6 (six) hours as needed (wheezing, shortness of breath). 360 mL 1  . lisinopril (PRINIVIL,ZESTRIL) 40 MG tablet Take 40 mg by mouth daily.    . tamsulosin (FLOMAX) 0.4 MG CAPS capsule Take 1 capsule (0.4 mg total) by mouth daily. 30 capsule 11  . tiotropium (SPIRIVA) 18 MCG inhalation capsule Place 18 mcg into inhaler and inhale daily.    Marland Kitchen triamcinolone cream (KENALOG) 0.1 % Apply 1 application topically 2 (two) times daily.     Current Facility-Administered Medications  Medication Dose Route Frequency Provider Last Rate Last Dose  . Leuprolide Acetate (6 Month) (LUPRON) injection 45 mg  45 mg Intramuscular Once Hollice Espy, MD        ECOG PERFORMANCE STATUS:  1 - Symptomatic but completely ambulatory  REVIEW OF SYSTEMS: except for the self-catheterization Patient denies any weight loss, fatigue, weakness, fever,  chills or night sweats. Patient denies any loss of vision, blurred vision. Patient denies any ringing  of the ears or hearing loss. No irregular heartbeat. Patient denies heart murmur or history of fainting. Patient denies any chest pain or pain radiating to her upper extremities. Patient  denies any shortness of breath, difficulty breathing at night, cough or hemoptysis. Patient denies any swelling in the lower legs. Patient denies any nausea vomiting, vomiting of blood, or coffee ground material in the vomitus. Patient denies any stomach pain. Patient states has had normal bowel movements no significant constipation or diarrhea. Patient denies any dysuria, hematuria or significant nocturia. Patient denies any problems walking, swelling in the joints or loss of balance. Patient denies any skin changes, loss of hair or loss of weight. Patient denies any excessive worrying or anxiety or significant depression. Patient denies any problems with insomnia. Patient denies excessive thirst, polyuria, polydipsia. Patient denies any swollen glands, patient denies easy bruising or easy bleeding. Patient denies any recent infections, allergies or URI. Patient "s visual fields have not changed significantly in recent time.    PHYSICAL EXAM: BP 123/60   Pulse 85   Temp 97.6 F (36.4 C)   Resp 20   Wt 234 lb 0.3 oz (106.1 kg)   BMI 30.87 kg/m  On rectal exam rectal sphincter tone is good prostate is diffusely involved with carcinoma. The right sulcus is distorted somewhat. Rectal exam is difficult. Well-developed well-nourished patient in NAD. HEENT reveals PERLA, EOMI, discs not visualized.  Oral cavity is clear. No oral mucosal lesions are identified. Neck is clear without evidence of cervical or supraclavicular adenopathy. Lungs are clear to A&P. Cardiac examination is essentially unremarkable with regular rate and rhythm without murmur rub or thrill. Abdomen is benign with no organomegaly or masses noted. Motor sensory and DTR levels are equal and symmetric in the upper and lower extremities. Cranial nerves II through XII are grossly intact. Proprioception is intact. No peripheral adenopathy or edema is identified. No motor or sensory levels are noted. Crude visual fields are within normal  range.  LABORATORY DATA: pathology reports reviewed    RADIOLOGY RESULTS:CT scan and bone scan reviewed   IMPRESSION: stage III adenocarcinoma the prostate in 67 year old male. Patient was T3 based on extension to the bladder base  PLAN: at this time I have recommended I MRT image guided radiation therapy to his prostate and pelvic nodes. Would treat up to 8000 cGy to his prostate and 5400 cGy to his pelvic nodes using IM RT dose painting technique. I have asked Dr. Erlene Quan to place fiduciary markers in his prostate for image guided treatment. Do not anticipate putting seeds in this patient for boost since I believe a TURP in his future may be indicated. Patient also will be starting under urology's care adjuvant deprivation therapy. Risks and benefits of radiation including fatigue alteration of blood counts possible diarrhea skin reaction all were discussed in detail with the patient and his wife. I have scheduled the patient for CT simulation after gold markers are placed. Patient is wife both seem to comprehend my treatment plan well.  I would like to take this opportunity to thank you for allowing me to participate in the care of your patient.Noreene Filbert, MD

## 2017-10-20 ENCOUNTER — Ambulatory Visit (INDEPENDENT_AMBULATORY_CARE_PROVIDER_SITE_OTHER): Payer: Medicare Other | Admitting: Urology

## 2017-10-20 ENCOUNTER — Encounter: Payer: Self-pay | Admitting: Urology

## 2017-10-20 VITALS — BP 123/65 | HR 92

## 2017-10-20 DIAGNOSIS — C61 Malignant neoplasm of prostate: Secondary | ICD-10-CM | POA: Diagnosis not present

## 2017-10-20 MED ORDER — LEVOFLOXACIN 500 MG PO TABS
500.0000 mg | ORAL_TABLET | Freq: Once | ORAL | Status: AC
  Administered 2017-10-20: 500 mg via ORAL

## 2017-10-20 MED ORDER — GENTAMICIN SULFATE 40 MG/ML IJ SOLN
80.0000 mg | Freq: Once | INTRAMUSCULAR | Status: AC
  Administered 2017-10-20: 80 mg via INTRAMUSCULAR

## 2017-10-20 NOTE — Progress Notes (Signed)
10/20/17  CC: gold seed markers  HPI: 67 y.o. male with prostate cancer who presents today for placement of fiducial seed markers in anticipation of his upcoming IMRT with Dr. Baruch Gouty.  Prostate Gold Seed Marker Placement Procedure   Informed consent was obtained after discussing risks/benefits of the procedure.  A time out was performed to ensure correct patient identity.  Pre-Procedure: - Gentamicin given prophylactically - PO Levaquin 500 mg also given today  Procedure: -Lidocaine jelly was administered per rectum -Rectal ultrasound probe was placed without difficulty and the prostate visualized - 3 fiducial gold seed markers placed, one at right base, one at left base, one at apex of prostate gland under transrectal ultrasound guidance  Post-Procedure: - Patient tolerated the procedure well - He was counseled to seek immediate medical attention if experiences any severe pain, significant bleeding, or fevers  Hollice Espy, MD

## 2017-10-25 ENCOUNTER — Ambulatory Visit
Admission: RE | Admit: 2017-10-25 | Discharge: 2017-10-25 | Disposition: A | Discharge status: Home / Self Care | Payer: Medicare Other | Source: Ambulatory Visit | Attending: Radiation Oncology | Admitting: Radiation Oncology

## 2017-10-25 DIAGNOSIS — Z510 Encounter for antineoplastic radiation therapy: Secondary | ICD-10-CM | POA: Diagnosis present

## 2017-10-25 DIAGNOSIS — C61 Malignant neoplasm of prostate: Secondary | ICD-10-CM | POA: Insufficient documentation

## 2017-10-28 DIAGNOSIS — Z510 Encounter for antineoplastic radiation therapy: Secondary | ICD-10-CM | POA: Diagnosis not present

## 2017-10-29 ENCOUNTER — Other Ambulatory Visit: Payer: Self-pay | Admitting: *Deleted

## 2017-10-29 DIAGNOSIS — C61 Malignant neoplasm of prostate: Secondary | ICD-10-CM

## 2017-11-03 ENCOUNTER — Ambulatory Visit
Admission: RE | Admit: 2017-11-03 | Discharge: 2017-11-03 | Disposition: A | Discharge status: Home / Self Care | Payer: Medicare Other | Source: Ambulatory Visit | Attending: Radiation Oncology | Admitting: Radiation Oncology

## 2017-11-04 ENCOUNTER — Ambulatory Visit
Admission: RE | Admit: 2017-11-04 | Discharge: 2017-11-04 | Disposition: A | Discharge status: Home / Self Care | Payer: Medicare Other | Source: Ambulatory Visit | Attending: Radiation Oncology | Admitting: Radiation Oncology

## 2017-11-04 DIAGNOSIS — Z510 Encounter for antineoplastic radiation therapy: Secondary | ICD-10-CM | POA: Diagnosis not present

## 2017-11-05 ENCOUNTER — Ambulatory Visit
Admission: RE | Admit: 2017-11-05 | Discharge: 2017-11-05 | Disposition: A | Discharge status: Home / Self Care | Payer: Medicare Other | Source: Ambulatory Visit | Attending: Radiation Oncology | Admitting: Radiation Oncology

## 2017-11-05 DIAGNOSIS — Z510 Encounter for antineoplastic radiation therapy: Secondary | ICD-10-CM | POA: Diagnosis not present

## 2017-11-08 ENCOUNTER — Ambulatory Visit
Admission: RE | Admit: 2017-11-08 | Discharge: 2017-11-08 | Disposition: A | Discharge status: Home / Self Care | Payer: Medicare Other | Source: Ambulatory Visit | Attending: Radiation Oncology | Admitting: Radiation Oncology

## 2017-11-08 DIAGNOSIS — Z510 Encounter for antineoplastic radiation therapy: Secondary | ICD-10-CM | POA: Diagnosis not present

## 2017-11-09 ENCOUNTER — Ambulatory Visit
Admission: RE | Admit: 2017-11-09 | Discharge: 2017-11-09 | Disposition: A | Discharge status: Home / Self Care | Payer: Medicare Other | Source: Ambulatory Visit | Attending: Radiation Oncology | Admitting: Radiation Oncology

## 2017-11-09 DIAGNOSIS — Z510 Encounter for antineoplastic radiation therapy: Secondary | ICD-10-CM | POA: Diagnosis not present

## 2017-11-10 ENCOUNTER — Ambulatory Visit
Admission: RE | Admit: 2017-11-10 | Discharge: 2017-11-10 | Disposition: A | Discharge status: Home / Self Care | Payer: Medicare Other | Source: Ambulatory Visit | Attending: Radiation Oncology | Admitting: Radiation Oncology

## 2017-11-10 DIAGNOSIS — Z510 Encounter for antineoplastic radiation therapy: Secondary | ICD-10-CM | POA: Diagnosis not present

## 2017-11-11 ENCOUNTER — Ambulatory Visit
Admission: RE | Admit: 2017-11-11 | Discharge: 2017-11-11 | Disposition: A | Discharge status: Home / Self Care | Payer: Medicare Other | Source: Ambulatory Visit | Attending: Radiation Oncology | Admitting: Radiation Oncology

## 2017-11-11 DIAGNOSIS — Z510 Encounter for antineoplastic radiation therapy: Secondary | ICD-10-CM | POA: Diagnosis not present

## 2017-11-12 ENCOUNTER — Ambulatory Visit
Admission: RE | Admit: 2017-11-12 | Discharge: 2017-11-12 | Disposition: A | Discharge status: Home / Self Care | Payer: Medicare Other | Source: Ambulatory Visit | Attending: Radiation Oncology | Admitting: Radiation Oncology

## 2017-11-12 DIAGNOSIS — Z510 Encounter for antineoplastic radiation therapy: Secondary | ICD-10-CM | POA: Diagnosis not present

## 2017-11-15 ENCOUNTER — Ambulatory Visit
Admission: RE | Admit: 2017-11-15 | Discharge: 2017-11-15 | Disposition: A | Discharge status: Home / Self Care | Payer: Medicare Other | Source: Ambulatory Visit | Attending: Radiation Oncology | Admitting: Radiation Oncology

## 2017-11-15 DIAGNOSIS — Z510 Encounter for antineoplastic radiation therapy: Secondary | ICD-10-CM | POA: Diagnosis not present

## 2017-11-16 ENCOUNTER — Ambulatory Visit
Admission: RE | Admit: 2017-11-16 | Discharge: 2017-11-16 | Disposition: A | Discharge status: Home / Self Care | Payer: Medicare Other | Source: Ambulatory Visit | Attending: Radiation Oncology | Admitting: Radiation Oncology

## 2017-11-16 DIAGNOSIS — Z510 Encounter for antineoplastic radiation therapy: Secondary | ICD-10-CM | POA: Diagnosis not present

## 2017-11-17 ENCOUNTER — Ambulatory Visit
Admission: RE | Admit: 2017-11-17 | Discharge: 2017-11-17 | Disposition: A | Discharge status: Home / Self Care | Payer: Medicare Other | Source: Ambulatory Visit | Attending: Radiation Oncology | Admitting: Radiation Oncology

## 2017-11-17 DIAGNOSIS — Z510 Encounter for antineoplastic radiation therapy: Secondary | ICD-10-CM | POA: Diagnosis not present

## 2017-11-18 ENCOUNTER — Ambulatory Visit
Admission: RE | Admit: 2017-11-18 | Discharge: 2017-11-18 | Disposition: A | Discharge status: Home / Self Care | Payer: Medicare Other | Source: Ambulatory Visit | Attending: Radiation Oncology | Admitting: Radiation Oncology

## 2017-11-18 ENCOUNTER — Inpatient Hospital Stay: Payer: Medicare Other | Attending: Radiation Oncology

## 2017-11-18 ENCOUNTER — Other Ambulatory Visit: Payer: Self-pay

## 2017-11-18 DIAGNOSIS — Z510 Encounter for antineoplastic radiation therapy: Secondary | ICD-10-CM | POA: Diagnosis not present

## 2017-11-18 DIAGNOSIS — C61 Malignant neoplasm of prostate: Secondary | ICD-10-CM | POA: Insufficient documentation

## 2017-11-18 LAB — CBC
HCT: 39 % — ABNORMAL LOW (ref 40.0–52.0)
Hemoglobin: 13.1 g/dL (ref 13.0–18.0)
MCH: 27.1 pg (ref 26.0–34.0)
MCHC: 33.6 g/dL (ref 32.0–36.0)
MCV: 80.5 fL (ref 80.0–100.0)
PLATELETS: 207 10*3/uL (ref 150–440)
RBC: 4.84 MIL/uL (ref 4.40–5.90)
RDW: 20.8 % — ABNORMAL HIGH (ref 11.5–14.5)
WBC: 5.7 10*3/uL (ref 3.8–10.6)

## 2017-11-19 ENCOUNTER — Ambulatory Visit
Admission: RE | Admit: 2017-11-19 | Discharge: 2017-11-19 | Disposition: A | Discharge status: Home / Self Care | Payer: Medicare Other | Source: Ambulatory Visit | Attending: Radiation Oncology | Admitting: Radiation Oncology

## 2017-11-19 DIAGNOSIS — Z510 Encounter for antineoplastic radiation therapy: Secondary | ICD-10-CM | POA: Diagnosis not present

## 2017-11-22 ENCOUNTER — Ambulatory Visit
Admission: RE | Admit: 2017-11-22 | Discharge: 2017-11-22 | Disposition: A | Discharge status: Home / Self Care | Payer: Medicare Other | Source: Ambulatory Visit | Attending: Radiation Oncology | Admitting: Radiation Oncology

## 2017-11-22 DIAGNOSIS — C61 Malignant neoplasm of prostate: Secondary | ICD-10-CM | POA: Insufficient documentation

## 2017-11-22 DIAGNOSIS — Z510 Encounter for antineoplastic radiation therapy: Secondary | ICD-10-CM | POA: Insufficient documentation

## 2017-11-23 ENCOUNTER — Ambulatory Visit
Admission: RE | Admit: 2017-11-23 | Discharge: 2017-11-23 | Disposition: A | Discharge status: Home / Self Care | Payer: Medicare Other | Source: Ambulatory Visit | Attending: Radiation Oncology | Admitting: Radiation Oncology

## 2017-11-23 DIAGNOSIS — Z510 Encounter for antineoplastic radiation therapy: Secondary | ICD-10-CM | POA: Diagnosis not present

## 2017-11-24 ENCOUNTER — Ambulatory Visit
Admission: RE | Admit: 2017-11-24 | Discharge: 2017-11-24 | Disposition: A | Discharge status: Home / Self Care | Payer: Medicare Other | Source: Ambulatory Visit | Attending: Radiation Oncology | Admitting: Radiation Oncology

## 2017-11-24 DIAGNOSIS — Z510 Encounter for antineoplastic radiation therapy: Secondary | ICD-10-CM | POA: Diagnosis not present

## 2017-11-26 ENCOUNTER — Ambulatory Visit
Admission: RE | Admit: 2017-11-26 | Discharge: 2017-11-26 | Disposition: A | Discharge status: Home / Self Care | Payer: Medicare Other | Source: Ambulatory Visit | Attending: Radiation Oncology | Admitting: Radiation Oncology

## 2017-11-26 DIAGNOSIS — Z510 Encounter for antineoplastic radiation therapy: Secondary | ICD-10-CM | POA: Diagnosis not present

## 2017-11-29 ENCOUNTER — Ambulatory Visit
Admission: RE | Admit: 2017-11-29 | Discharge: 2017-11-29 | Disposition: A | Discharge status: Home / Self Care | Payer: Medicare Other | Source: Ambulatory Visit | Attending: Radiation Oncology | Admitting: Radiation Oncology

## 2017-11-29 DIAGNOSIS — Z510 Encounter for antineoplastic radiation therapy: Secondary | ICD-10-CM | POA: Diagnosis not present

## 2017-11-30 ENCOUNTER — Ambulatory Visit
Admission: RE | Admit: 2017-11-30 | Discharge: 2017-11-30 | Disposition: A | Discharge status: Home / Self Care | Payer: Medicare Other | Source: Ambulatory Visit | Attending: Radiation Oncology | Admitting: Radiation Oncology

## 2017-11-30 DIAGNOSIS — Z510 Encounter for antineoplastic radiation therapy: Secondary | ICD-10-CM | POA: Diagnosis not present

## 2017-12-01 ENCOUNTER — Ambulatory Visit
Admission: RE | Admit: 2017-12-01 | Discharge: 2017-12-01 | Disposition: A | Discharge status: Home / Self Care | Payer: Medicare Other | Source: Ambulatory Visit | Attending: Radiation Oncology | Admitting: Radiation Oncology

## 2017-12-01 DIAGNOSIS — Z510 Encounter for antineoplastic radiation therapy: Secondary | ICD-10-CM | POA: Diagnosis not present

## 2017-12-02 ENCOUNTER — Other Ambulatory Visit: Payer: Self-pay

## 2017-12-02 ENCOUNTER — Ambulatory Visit
Admission: RE | Admit: 2017-12-02 | Discharge: 2017-12-02 | Disposition: A | Discharge status: Home / Self Care | Payer: Medicare Other | Source: Ambulatory Visit | Attending: Radiation Oncology | Admitting: Radiation Oncology

## 2017-12-02 ENCOUNTER — Inpatient Hospital Stay: Payer: Medicare Other | Attending: Radiation Oncology

## 2017-12-02 DIAGNOSIS — C61 Malignant neoplasm of prostate: Secondary | ICD-10-CM | POA: Diagnosis present

## 2017-12-02 DIAGNOSIS — Z510 Encounter for antineoplastic radiation therapy: Secondary | ICD-10-CM | POA: Diagnosis not present

## 2017-12-02 LAB — CBC
HCT: 40.9 % (ref 40.0–52.0)
Hemoglobin: 14.2 g/dL (ref 13.0–18.0)
MCH: 28.5 pg (ref 26.0–34.0)
MCHC: 34.7 g/dL (ref 32.0–36.0)
MCV: 82.1 fL (ref 80.0–100.0)
PLATELETS: 278 10*3/uL (ref 150–440)
RBC: 4.98 MIL/uL (ref 4.40–5.90)
RDW: 21.9 % — AB (ref 11.5–14.5)
WBC: 8.7 10*3/uL (ref 3.8–10.6)

## 2017-12-03 ENCOUNTER — Ambulatory Visit
Admission: RE | Admit: 2017-12-03 | Discharge: 2017-12-03 | Disposition: A | Discharge status: Home / Self Care | Payer: Medicare Other | Source: Ambulatory Visit | Attending: Radiation Oncology | Admitting: Radiation Oncology

## 2017-12-03 DIAGNOSIS — Z510 Encounter for antineoplastic radiation therapy: Secondary | ICD-10-CM | POA: Diagnosis not present

## 2017-12-06 ENCOUNTER — Ambulatory Visit
Admission: RE | Admit: 2017-12-06 | Discharge: 2017-12-06 | Disposition: A | Discharge status: Home / Self Care | Payer: Medicare Other | Source: Ambulatory Visit | Attending: Radiation Oncology | Admitting: Radiation Oncology

## 2017-12-06 DIAGNOSIS — Z510 Encounter for antineoplastic radiation therapy: Secondary | ICD-10-CM | POA: Diagnosis not present

## 2017-12-07 ENCOUNTER — Ambulatory Visit
Admission: RE | Admit: 2017-12-07 | Discharge: 2017-12-07 | Disposition: A | Discharge status: Home / Self Care | Payer: Medicare Other | Source: Ambulatory Visit | Attending: Radiation Oncology | Admitting: Radiation Oncology

## 2017-12-07 ENCOUNTER — Telehealth: Payer: Self-pay | Admitting: Urology

## 2017-12-07 DIAGNOSIS — Z510 Encounter for antineoplastic radiation therapy: Secondary | ICD-10-CM | POA: Diagnosis not present

## 2017-12-07 NOTE — Telephone Encounter (Signed)
Patient is burning with urination and after self cathing there is puss coming out of his penis. Thinks he has a UTI. He is having radiation not sure if it is coming from that or not. Will bring him on the lab to do a UA and culture.  Sharyn Lull

## 2017-12-08 ENCOUNTER — Ambulatory Visit
Admission: RE | Admit: 2017-12-08 | Discharge: 2017-12-08 | Disposition: A | Discharge status: Home / Self Care | Payer: Medicare Other | Source: Ambulatory Visit | Attending: Radiation Oncology | Admitting: Radiation Oncology

## 2017-12-08 ENCOUNTER — Other Ambulatory Visit: Payer: Medicare Other

## 2017-12-08 ENCOUNTER — Telehealth: Payer: Self-pay | Admitting: Urology

## 2017-12-08 DIAGNOSIS — R300 Dysuria: Secondary | ICD-10-CM

## 2017-12-08 DIAGNOSIS — Z510 Encounter for antineoplastic radiation therapy: Secondary | ICD-10-CM | POA: Diagnosis not present

## 2017-12-08 LAB — URINALYSIS, COMPLETE
Bilirubin, UA: NEGATIVE
Glucose, UA: NEGATIVE
KETONES UA: NEGATIVE
Leukocytes, UA: NEGATIVE
NITRITE UA: NEGATIVE
SPEC GRAV UA: 1.025 (ref 1.005–1.030)
UUROB: 0.2 mg/dL (ref 0.2–1.0)
pH, UA: 6.5 (ref 5.0–7.5)

## 2017-12-08 LAB — MICROSCOPIC EXAMINATION: EPITHELIAL CELLS (NON RENAL): NONE SEEN /HPF (ref 0–10)

## 2017-12-08 MED ORDER — SULFAMETHOXAZOLE-TRIMETHOPRIM 800-160 MG PO TABS: 1.0000 | ORAL_TABLET | Freq: Two times a day (BID) | ORAL | 0 refills | Status: DC

## 2017-12-08 NOTE — Telephone Encounter (Signed)
Please let Mr. Mark know that UA did have some bacteria and blood in it.  We are going to send it for culture.  The results may not be available until Monday.  I would like him to start Septra DS, one bid for seven days.

## 2017-12-08 NOTE — Telephone Encounter (Signed)
Patient notified on vmail, script sent to [facility]

## 2017-12-09 ENCOUNTER — Ambulatory Visit
Admission: RE | Admit: 2017-12-09 | Discharge: 2017-12-09 | Disposition: A | Discharge status: Home / Self Care | Payer: Medicare Other | Source: Ambulatory Visit | Attending: Radiation Oncology | Admitting: Radiation Oncology

## 2017-12-09 DIAGNOSIS — Z510 Encounter for antineoplastic radiation therapy: Secondary | ICD-10-CM | POA: Diagnosis not present

## 2017-12-10 ENCOUNTER — Ambulatory Visit
Admission: RE | Admit: 2017-12-10 | Discharge: 2017-12-10 | Disposition: A | Discharge status: Home / Self Care | Payer: Medicare Other | Source: Ambulatory Visit | Attending: Radiation Oncology | Admitting: Radiation Oncology

## 2017-12-10 DIAGNOSIS — Z510 Encounter for antineoplastic radiation therapy: Secondary | ICD-10-CM | POA: Diagnosis not present

## 2017-12-10 LAB — CULTURE, URINE COMPREHENSIVE

## 2017-12-13 ENCOUNTER — Telehealth: Payer: Self-pay | Admitting: Urology

## 2017-12-13 ENCOUNTER — Telehealth: Payer: Self-pay

## 2017-12-13 ENCOUNTER — Ambulatory Visit
Admission: RE | Admit: 2017-12-13 | Discharge: 2017-12-13 | Disposition: A | Discharge status: Home / Self Care | Payer: Medicare Other | Source: Ambulatory Visit | Attending: Radiation Oncology | Admitting: Radiation Oncology

## 2017-12-13 DIAGNOSIS — Z510 Encounter for antineoplastic radiation therapy: Secondary | ICD-10-CM | POA: Diagnosis not present

## 2017-12-13 MED ORDER — MIRABEGRON ER 50 MG PO TB24: 50.0000 mg | ORAL_TABLET | Freq: Every day | ORAL | 11 refills | Status: DC

## 2017-12-13 NOTE — Telephone Encounter (Signed)
-----   Message from Nori Riis, PA-C sent at 12/12/2017 10:55 PM EDT ----- Please let Christopher Mendez know that his urine culture was negative.  The pus is likely from the irritation of the self cathing.  The blood in the urine could be from the radiation and the self cathing.

## 2017-12-13 NOTE — Telephone Encounter (Signed)
Pt wife informed. She states that the pt is catheterizing himself 10-16 times a day. He is having persistent bladder spasms. Myrbetriq 50mg  was sent to pharmacy and pt informed not to cath more than 8 times a day.

## 2017-12-13 NOTE — Telephone Encounter (Signed)
Pt's wife called and stated they would not be able to get Rx for Myrbetriq filled until tomorrow afternoon.  He will be at the cancer center tomorrow and wants to come by and pick up samples.  Gerald Stabs said that would be fine.  I told pt they would be at the front desk.

## 2017-12-14 ENCOUNTER — Ambulatory Visit
Admission: RE | Admit: 2017-12-14 | Discharge: 2017-12-14 | Disposition: A | Discharge status: Home / Self Care | Payer: Medicare Other | Source: Ambulatory Visit | Attending: Radiation Oncology | Admitting: Radiation Oncology

## 2017-12-14 DIAGNOSIS — Z510 Encounter for antineoplastic radiation therapy: Secondary | ICD-10-CM | POA: Diagnosis not present

## 2017-12-15 ENCOUNTER — Ambulatory Visit
Admission: RE | Admit: 2017-12-15 | Discharge: 2017-12-15 | Disposition: A | Discharge status: Home / Self Care | Payer: Medicare Other | Source: Ambulatory Visit | Attending: Radiation Oncology | Admitting: Radiation Oncology

## 2017-12-15 DIAGNOSIS — Z510 Encounter for antineoplastic radiation therapy: Secondary | ICD-10-CM | POA: Diagnosis not present

## 2017-12-16 ENCOUNTER — Inpatient Hospital Stay: Payer: Medicare Other

## 2017-12-16 ENCOUNTER — Ambulatory Visit
Admission: RE | Admit: 2017-12-16 | Discharge: 2017-12-16 | Disposition: A | Discharge status: Home / Self Care | Payer: Medicare Other | Source: Ambulatory Visit | Attending: Radiation Oncology | Admitting: Radiation Oncology

## 2017-12-16 DIAGNOSIS — C61 Malignant neoplasm of prostate: Secondary | ICD-10-CM | POA: Diagnosis not present

## 2017-12-16 DIAGNOSIS — Z510 Encounter for antineoplastic radiation therapy: Secondary | ICD-10-CM | POA: Diagnosis not present

## 2017-12-16 LAB — CBC
HEMATOCRIT: 39.8 % — AB (ref 40.0–52.0)
Hemoglobin: 13.5 g/dL (ref 13.0–18.0)
MCH: 28.7 pg (ref 26.0–34.0)
MCHC: 33.8 g/dL (ref 32.0–36.0)
MCV: 84.9 fL (ref 80.0–100.0)
Platelets: 274 10*3/uL (ref 150–440)
RBC: 4.69 MIL/uL (ref 4.40–5.90)
RDW: 22.2 % — AB (ref 11.5–14.5)
WBC: 6 10*3/uL (ref 3.8–10.6)

## 2017-12-17 ENCOUNTER — Ambulatory Visit
Admission: RE | Admit: 2017-12-17 | Discharge: 2017-12-17 | Disposition: A | Discharge status: Home / Self Care | Payer: Medicare Other | Source: Ambulatory Visit | Attending: Radiation Oncology | Admitting: Radiation Oncology

## 2017-12-17 DIAGNOSIS — Z510 Encounter for antineoplastic radiation therapy: Secondary | ICD-10-CM | POA: Diagnosis not present

## 2017-12-20 ENCOUNTER — Ambulatory Visit
Admission: RE | Admit: 2017-12-20 | Discharge: 2017-12-20 | Disposition: A | Discharge status: Home / Self Care | Payer: Medicare Other | Source: Ambulatory Visit | Attending: Radiation Oncology | Admitting: Radiation Oncology

## 2017-12-20 DIAGNOSIS — Z510 Encounter for antineoplastic radiation therapy: Secondary | ICD-10-CM | POA: Diagnosis not present

## 2017-12-21 ENCOUNTER — Ambulatory Visit
Admission: RE | Admit: 2017-12-21 | Discharge: 2017-12-21 | Disposition: A | Discharge status: Home / Self Care | Payer: Medicare Other | Source: Ambulatory Visit | Attending: Radiation Oncology | Admitting: Radiation Oncology

## 2017-12-21 DIAGNOSIS — Z510 Encounter for antineoplastic radiation therapy: Secondary | ICD-10-CM | POA: Diagnosis not present

## 2017-12-22 ENCOUNTER — Ambulatory Visit
Admission: RE | Admit: 2017-12-22 | Discharge: 2017-12-22 | Disposition: A | Discharge status: Home / Self Care | Payer: Medicare Other | Source: Ambulatory Visit | Attending: Radiation Oncology | Admitting: Radiation Oncology

## 2017-12-22 DIAGNOSIS — Z510 Encounter for antineoplastic radiation therapy: Secondary | ICD-10-CM | POA: Diagnosis not present

## 2017-12-23 ENCOUNTER — Ambulatory Visit
Admission: RE | Admit: 2017-12-23 | Discharge: 2017-12-23 | Disposition: A | Discharge status: Home / Self Care | Payer: Medicare Other | Source: Ambulatory Visit | Attending: Radiation Oncology | Admitting: Radiation Oncology

## 2017-12-23 DIAGNOSIS — C61 Malignant neoplasm of prostate: Secondary | ICD-10-CM | POA: Diagnosis not present

## 2017-12-23 DIAGNOSIS — Z510 Encounter for antineoplastic radiation therapy: Secondary | ICD-10-CM | POA: Diagnosis present

## 2017-12-24 ENCOUNTER — Ambulatory Visit
Admission: RE | Admit: 2017-12-24 | Discharge: 2017-12-24 | Disposition: A | Discharge status: Home / Self Care | Payer: Medicare Other | Source: Ambulatory Visit | Attending: Radiation Oncology | Admitting: Radiation Oncology

## 2017-12-24 DIAGNOSIS — Z510 Encounter for antineoplastic radiation therapy: Secondary | ICD-10-CM | POA: Diagnosis not present

## 2017-12-27 ENCOUNTER — Ambulatory Visit
Admission: RE | Admit: 2017-12-27 | Discharge: 2017-12-27 | Disposition: A | Discharge status: Home / Self Care | Payer: Medicare Other | Source: Ambulatory Visit | Attending: Radiation Oncology | Admitting: Radiation Oncology

## 2017-12-27 DIAGNOSIS — Z510 Encounter for antineoplastic radiation therapy: Secondary | ICD-10-CM | POA: Diagnosis not present

## 2017-12-28 ENCOUNTER — Ambulatory Visit
Admission: RE | Admit: 2017-12-28 | Discharge: 2017-12-28 | Disposition: A | Discharge status: Home / Self Care | Payer: Medicare Other | Source: Ambulatory Visit | Attending: Radiation Oncology | Admitting: Radiation Oncology

## 2017-12-28 DIAGNOSIS — Z510 Encounter for antineoplastic radiation therapy: Secondary | ICD-10-CM | POA: Diagnosis not present

## 2017-12-29 ENCOUNTER — Ambulatory Visit
Admission: RE | Admit: 2017-12-29 | Discharge: 2017-12-29 | Disposition: A | Discharge status: Home / Self Care | Payer: Medicare Other | Source: Ambulatory Visit | Attending: Radiation Oncology | Admitting: Radiation Oncology

## 2017-12-29 DIAGNOSIS — Z510 Encounter for antineoplastic radiation therapy: Secondary | ICD-10-CM | POA: Diagnosis not present

## 2017-12-30 ENCOUNTER — Ambulatory Visit
Admission: RE | Admit: 2017-12-30 | Discharge: 2017-12-30 | Disposition: A | Discharge status: Home / Self Care | Payer: Medicare Other | Source: Ambulatory Visit | Attending: Radiation Oncology | Admitting: Radiation Oncology

## 2017-12-30 DIAGNOSIS — Z510 Encounter for antineoplastic radiation therapy: Secondary | ICD-10-CM | POA: Diagnosis not present

## 2018-02-04 ENCOUNTER — Encounter: Payer: Self-pay | Admitting: Radiation Oncology

## 2018-02-04 ENCOUNTER — Ambulatory Visit
Admission: RE | Admit: 2018-02-04 | Discharge: 2018-02-04 | Disposition: A | Discharge status: Home / Self Care | Payer: Medicare Other | Source: Ambulatory Visit | Attending: Radiation Oncology | Admitting: Radiation Oncology

## 2018-02-04 ENCOUNTER — Other Ambulatory Visit: Payer: Self-pay | Admitting: *Deleted

## 2018-02-04 ENCOUNTER — Other Ambulatory Visit: Payer: Self-pay

## 2018-02-04 VITALS — BP 157/74 | HR 84 | Temp 98.5°F | Wt 230.7 lb

## 2018-02-04 DIAGNOSIS — C61 Malignant neoplasm of prostate: Secondary | ICD-10-CM | POA: Insufficient documentation

## 2018-02-04 NOTE — Progress Notes (Signed)
Radiation Oncology Follow up Note  Name: Christopher Mendez   Date:   02/04/2018 MRN:  [id number] DOB: 1950-09-14    This 67 y.o. male presents to the clinic today for one-month follow-up status post image guided I MRT radiation therapy.to both his prostate and pelvic nodes for a Gleason 9 (4+5) adenocarcinoma presenting with a PSA of 56  REFERRING PROVIDER: Frazier Richards, MD  HPI: patient is a 67 year old male now seen out 1 month having completed IM RT to his prostate and pelvic nodes for Gleason 9 (4+5) adenocarcinoma presenting the PSA 56. He is seen today in routine follow-up and is doing well. He has very little lower urinary tract symptoms or diarrhea..we are checking on continuation of his Lupron therapy.  COMPLICATIONS OF TREATMENT: none  FOLLOW UP COMPLIANCE: keeps appointments   PHYSICAL EXAM:  BP (!) 157/74 (BP Location: Left Arm, Patient Position: Sitting)   Pulse 84   Temp 98.5 F (36.9 C) (Tympanic)   Wt 230 lb 11.4 oz (104.6 kg)   BMI 30.44 kg/m  Well-developed well-nourished patient in NAD. HEENT reveals PERLA, EOMI, discs not visualized.  Oral cavity is clear. No oral mucosal lesions are identified. Neck is clear without evidence of cervical or supraclavicular adenopathy. Lungs are clear to A&P. Cardiac examination is essentially unremarkable with regular rate and rhythm without murmur rub or thrill. Abdomen is benign with no organomegaly or masses noted. Motor sensory and DTR levels are equal and symmetric in the upper and lower extremities. Cranial nerves II through XII are grossly intact. Proprioception is intact. No peripheral adenopathy or edema is identified. No motor or sensory levels are noted. Crude visual fields are within normal range.  RADIOLOGY RESULTS: no current films for review  PLAN: present time patient from side effect profile is doing well. I'm please was overall progress. I've asked to see him back in about 3 months while a PSA drawn prior to that  visit. We are checking on his Lupron therapy and will contact Dr. Cherrie Gauze office for continuation of back care since we like him suppressed for probably 2 years. Patient is to call with any concerns.  I would like to take this opportunity to thank you for allowing me to participate in the care of your patient.Noreene Filbert, MD

## 2018-02-04 NOTE — Progress Notes (Signed)
Patient still in and out caths multiple times daily

## 2018-02-07 ENCOUNTER — Telehealth: Payer: Self-pay | Admitting: Urology

## 2018-02-07 NOTE — Telephone Encounter (Signed)
Pt wife states pt is done with his radiation treatments, pt is still catheterizing himself, pt wife wants to know if pt should still be cathing himself and if he should be seen prior to his 6 mos follow up scheduled with Dr. Erlene Quan in November. Please advise Christopher Mendez [phone].

## 2018-02-09 NOTE — Telephone Encounter (Signed)
Spoke to patient's wife and she states patient would like to know now that he has finished radiation when can he talk about TURP to help with urinary symptoms?

## 2018-02-09 NOTE — Telephone Encounter (Signed)
Left mess to call 

## 2018-02-09 NOTE — Telephone Encounter (Signed)
If he is not urinating spontaneously, he should be self cathing to empty his bladder.  Typically, we do not like to perform any TUR procedures for at least 6 months after radiation.  This gives the prostate some time to heal.  I was hoping that the radiation and Lupron which ring his prostate enough that he would start voiding spontaneously and would not need TURP.    Hollice Espy, MD

## 2018-02-10 NOTE — Telephone Encounter (Signed)
Left detailed message for pt 

## 2018-04-15 ENCOUNTER — Encounter: Payer: Self-pay | Admitting: *Deleted

## 2018-04-15 ENCOUNTER — Other Ambulatory Visit: Payer: Medicare Other

## 2018-04-15 DIAGNOSIS — C61 Malignant neoplasm of prostate: Secondary | ICD-10-CM

## 2018-04-16 LAB — PSA: Prostate Specific Ag, Serum: 0.2 ng/mL (ref 0.0–4.0)

## 2018-04-19 ENCOUNTER — Encounter: Payer: Self-pay | Admitting: Urology

## 2018-04-19 ENCOUNTER — Other Ambulatory Visit: Payer: Self-pay

## 2018-04-19 ENCOUNTER — Ambulatory Visit (INDEPENDENT_AMBULATORY_CARE_PROVIDER_SITE_OTHER): Payer: Medicare Other | Admitting: Urology

## 2018-04-19 VITALS — BP 119/68 | HR 83 | Wt 237.0 lb

## 2018-04-19 DIAGNOSIS — R339 Retention of urine, unspecified: Secondary | ICD-10-CM | POA: Diagnosis not present

## 2018-04-19 DIAGNOSIS — C61 Malignant neoplasm of prostate: Secondary | ICD-10-CM

## 2018-04-19 MED ORDER — LEUPROLIDE ACETATE (6 MONTH) 45 MG IM KIT
45.0000 mg | PACK | Freq: Once | INTRAMUSCULAR | Status: AC
  Administered 2018-04-19: 45 mg via INTRAMUSCULAR

## 2018-04-19 NOTE — Progress Notes (Signed)
04/19/2018 11:47 AM   Christopher Mendez 08-03-1950 [id number]  Referring provider: [facility], [facility] [place],  [address]  Chief Complaint  Patient presents with  . Prostate Cancer    HPI: 67 year old male with advanced prostate cancer who returns today for follow-up.  He initially presented with a markedly elevated PSA to 56.7 with a rockhard nodular prostate consistent with clinical prostate cancer.  He also was in urinary retention unable to void spontaneously at the time.  He was started on degerelix and underwent multiple failed voiding trials.  Ultimately, he started on CIC and underwent prostate biopsy on 09/23/2017.  This revealed a 12 of 12 cores positive of high risk disease, Gleason 4+5 and 4+4 in all cores involving up to 100% of the tissue.  CT abdomen pelvis with contrast shows an irregular mass extending from the peripheral aspect of the prostate involving the adjacent base of the bladder.  He also had an indeterminate sclerotic lesion of the femoral neck and L3 vertebral body.  There is no significant pelvic adenopathy or any other concerning for metastatic disease.  He underwent bone scan which was unremarkable.  He is now completed EBRT (completed 12/2017) and continues on ADT.  He is due for six-month injection today.  Recent PSA drawn just a few days ago 0.2.  He continues to self cath x 10 day .  He has been voiding small amounts at night.    PMH: Past Medical History:  Diagnosis Date  . Cardiac enlargement   . COPD (chronic obstructive pulmonary disease) ([place])   . HTN (hypertension)     Surgical History: No past surgical history on file.  Home Medications:  Allergies as of 04/19/2018      Reactions   Flomax [tamsulosin Hcl] Rash   Myrbetriq [mirabegron] Rash      Medication List        Accurate as of 04/19/18 11:59 PM. Always use your most recent med list.          albuterol 108 (90 Base) MCG/ACT  inhaler Commonly known as:  PROVENTIL HFA;VENTOLIN HFA Inhale 1 puff into the lungs every 6 (six) hours as needed for wheezing or shortness of breath.   Fluticasone-Salmeterol 500-50 MCG/DOSE Aepb Commonly known as:  ADVAIR Inhale 1 puff into the lungs 2 (two) times daily.   ipratropium-albuterol 0.5-2.5 (3) MG/3ML Soln Commonly known as:  DUONEB Take 3 mLs by nebulization every 6 (six) hours as needed (wheezing, shortness of breath).   lisinopril 40 MG tablet Commonly known as:  PRINIVIL,ZESTRIL Take 40 mg by mouth daily.   tiotropium 18 MCG inhalation capsule Commonly known as:  SPIRIVA Place 18 mcg into inhaler and inhale daily.   triamcinolone cream 0.1 % Commonly known as:  KENALOG Apply 1 application topically 2 (two) times daily.       Allergies:  Allergies  Allergen Reactions  . Flomax [Tamsulosin Hcl] Rash  . Myrbetriq [Mirabegron] Rash    Family History: Family History  Problem Relation Age of Onset  . Hypertension Mother   . Diabetes Father     Social History:  reports that he has been smoking cigarettes. He has a 56.00 pack-year smoking history. He has never used smokeless tobacco. He reports that he drinks about 42.0 standard drinks of alcohol per week. He reports that he does not use drugs.  ROS: UROLOGY Frequent Urination?: No Hard to postpone urination?: No Burning/pain with urination?: No Get up at night to urinate?:  No Leakage of urine?: No Urine stream starts and stops?: No Trouble starting stream?: No Do you have to strain to urinate?: No Blood in urine?: No Urinary tract infection?: No Sexually transmitted disease?: No Injury to kidneys or bladder?: No Painful intercourse?: No Weak stream?: No Erection problems?: No Penile pain?: No  Gastrointestinal Nausea?: No Vomiting?: No Indigestion/heartburn?: No Diarrhea?: No Constipation?: No  Constitutional Fever: No Night sweats?: No Weight loss?: No Fatigue?: Yes  Skin Skin  rash/lesions?: Yes Itching?: Yes  Eyes Blurred vision?: No Double vision?: No  Ears/Nose/Throat Sore throat?: No Sinus problems?: No  Hematologic/Lymphatic Swollen glands?: No Easy bruising?: Yes  Cardiovascular Leg swelling?: No Chest pain?: No  Respiratory Cough?: No Shortness of breath?: Yes  Endocrine Excessive thirst?: No  Musculoskeletal Back pain?: No  Neurological Headaches?: No Dizziness?: No  Psychologic Depression?: No Anxiety?: No  Physical Exam: BP 119/68   Pulse 83   Wt 237 lb (107.5 kg)   BMI 31.27 kg/m   Constitutional:  Alert and oriented, No acute distress. HEENT: [place] AT, moist mucus membranes.  Trachea midline, no masses. Cardiovascular: No clubbing, cyanosis, or edema. Respiratory: Normal respiratory effort, no increased work of breathing. Neurologic: Grossly intact, no focal deficits, moving all 4 extremities. Psychiatric: Normal mood and affect.  Laboratory Data: Lab Results  Component Value Date   WBC 6.0 12/16/2017   HGB 13.5 12/16/2017   HCT 39.8 (L) 12/16/2017   MCV 84.9 12/16/2017   PLT 274 12/16/2017    Lab Results  Component Value Date   CREATININE 1.14 07/07/2017    Assessment & Plan:    1. Prostate cancer ([place]) Lupron x 6 months given today, reviewed bone health recs We will continue to follow PSA closely - Leuprolide Acetate (6 Month) (LUPRON) injection 45 mg  2. Urinary retention Continue to self cath with frequency as needed We discussed that about 6 months after completion of radiation, we would consider an outlet procedure in the form of channel TURP We will tentatively plan for cystoscopy to evaluate his anatomy in about 4 months at which time he will be a candidate for consideration of outlet procedure He is agreeable with this plan  Return in about 4 months (around 08/18/2018) for cysto; also make Lupron injection 6 months on nurse schedule.  Hollice Espy, MD  [facility] [address] [place], [place] [address] [phone]

## 2018-04-19 NOTE — Progress Notes (Signed)
Lupron IM Injection   Due to Prostate Cancer patient is present today for a Lupron Injection.  Medication: Lupron 6 month Dose: 45 mg  Location: right upper outer buttocks Lot: [id number] Exp: 07/21/2020  Patient tolerated well, no complications were noted  Performed by: Fonnie Jarvis, CMA  Follow up: 6 months

## 2018-05-06 ENCOUNTER — Inpatient Hospital Stay: Payer: Medicare Other | Attending: Radiation Oncology

## 2018-05-06 DIAGNOSIS — C61 Malignant neoplasm of prostate: Secondary | ICD-10-CM | POA: Insufficient documentation

## 2018-05-06 LAB — PSA: Prostatic Specific Antigen: 0.24 ng/mL (ref 0.00–4.00)

## 2018-05-13 ENCOUNTER — Ambulatory Visit: Payer: Medicare Other | Admitting: Radiation Oncology

## 2018-05-20 ENCOUNTER — Other Ambulatory Visit: Payer: Self-pay

## 2018-05-20 ENCOUNTER — Ambulatory Visit
Admission: RE | Admit: 2018-05-20 | Discharge: 2018-05-20 | Disposition: A | Discharge status: Home / Self Care | Payer: Medicare Other | Source: Ambulatory Visit | Attending: Radiation Oncology | Admitting: Radiation Oncology

## 2018-05-20 ENCOUNTER — Encounter: Payer: Self-pay | Admitting: Radiation Oncology

## 2018-05-20 VITALS — BP 139/67 | HR 76 | Temp 96.7°F | Resp 18 | Wt 233.4 lb

## 2018-05-20 DIAGNOSIS — C61 Malignant neoplasm of prostate: Secondary | ICD-10-CM

## 2018-05-20 DIAGNOSIS — Z923 Personal history of irradiation: Secondary | ICD-10-CM | POA: Insufficient documentation

## 2018-05-20 NOTE — Progress Notes (Signed)
Radiation Oncology Follow up Note  Name: Christopher Mendez   Date:   05/20/2018 MRN:  [id number] DOB: 1951-05-21    This 67 y.o. male presents to the clinic today for four-month follow-up status post I MRT radiation therapy to both prostate and pelvic nodes for Gleason 9 (4+5) adenocarcinoma the prostate presenting the PSA 56.Marland Kitchen  REFERRING PROVIDER: Frazier Richards, MD  HPI: patient is a 67 year old male now seen out 4 months having completed IM RT radiation therapy to his prostate and pelvic nodes for high risk adenocarcinoma the prostate Gleason score of 9 (4+5) presenting with a PSA 56. He is seen today in routine follow-up and is doing well specifically denies diarrhea dysuria or any other GI/GU complaints. He continues on Lupron therapy through the urologist office. His most recent PSA was 0.24.Marland Kitchen  COMPLICATIONS OF TREATMENT: none  FOLLOW UP COMPLIANCE: keeps appointments   PHYSICAL EXAM:  BP 139/67 (BP Location: Left Arm, Patient Position: Sitting)   Pulse 76   Temp (!) 96.7 F (35.9 C) (Tympanic)   Resp 18   Wt 233 lb 5.7 oz (105.8 kg)   BMI 30.79 kg/m  Well-developed well-nourished patient in NAD. HEENT reveals PERLA, EOMI, discs not visualized.  Oral cavity is clear. No oral mucosal lesions are identified. Neck is clear without evidence of cervical or supraclavicular adenopathy. Lungs are clear to A&P. Cardiac examination is essentially unremarkable with regular rate and rhythm without murmur rub or thrill. Abdomen is benign with no organomegaly or masses noted. Motor sensory and DTR levels are equal and symmetric in the upper and lower extremities. Cranial nerves II through XII are grossly intact. Proprioception is intact. No peripheral adenopathy or edema is identified. No motor or sensory levels are noted. Crude visual fields are within normal range.  RADIOLOGY RESULTS: o current films for review  PLAN: present time patient is doing well under excellent biochemical control of  his prostate cancer. He continues on Lupron therapy 3 urologist office. I have asked to see him back in 6 months for follow-up and will check a PSA at that time if it is not already been performed. Patient knows to call with any concerns.  I would like to take this opportunity to thank you for allowing me to participate in the care of your patient.Noreene Filbert, MD

## 2018-08-15 ENCOUNTER — Telehealth: Payer: Self-pay | Admitting: Urology

## 2018-08-15 NOTE — Telephone Encounter (Signed)
NO PA REQUIRED  08-15-18 Christopher Mendez

## 2018-08-16 ENCOUNTER — Other Ambulatory Visit: Payer: Medicare Other | Admitting: Urology

## 2018-10-19 ENCOUNTER — Encounter: Payer: Self-pay | Admitting: Urology

## 2018-10-19 ENCOUNTER — Other Ambulatory Visit: Payer: Self-pay

## 2018-10-19 ENCOUNTER — Ambulatory Visit: Payer: Medicare Other

## 2018-10-19 ENCOUNTER — Ambulatory Visit (INDEPENDENT_AMBULATORY_CARE_PROVIDER_SITE_OTHER): Payer: Medicare Other | Admitting: Urology

## 2018-10-19 VITALS — BP 131/68 | HR 91 | Ht 72.0 in | Wt 245.0 lb

## 2018-10-19 DIAGNOSIS — C61 Malignant neoplasm of prostate: Secondary | ICD-10-CM | POA: Diagnosis not present

## 2018-10-19 DIAGNOSIS — N138 Other obstructive and reflux uropathy: Secondary | ICD-10-CM

## 2018-10-19 DIAGNOSIS — N401 Enlarged prostate with lower urinary tract symptoms: Secondary | ICD-10-CM

## 2018-10-19 LAB — URINALYSIS, COMPLETE
Bilirubin, UA: NEGATIVE
Glucose, UA: NEGATIVE
Ketones, UA: NEGATIVE
Nitrite, UA: NEGATIVE
Specific Gravity, UA: 1.02 (ref 1.005–1.030)
Urobilinogen, Ur: 0.2 mg/dL (ref 0.2–1.0)
pH, UA: 6 (ref 5.0–7.5)

## 2018-10-19 LAB — MICROSCOPIC EXAMINATION

## 2018-10-19 MED ORDER — LEUPROLIDE ACETATE (6 MONTH) 45 MG IM KIT
45.0000 mg | PACK | Freq: Once | INTRAMUSCULAR | Status: AC
  Administered 2018-10-19: 45 mg via INTRAMUSCULAR

## 2018-10-19 NOTE — Progress Notes (Signed)
Lupron IM Injection   Due to Prostate Cancer patient is present today for a Lupron Injection.  Medication: Lupron 6 month Dose: 45 mg  Location: left upper outer buttocks Lot: [id number] Exp: 10/24/2020  Patient tolerated well, no complications were noted  Performed by: Fonnie Jarvis, CMA  Follow up: 6 month

## 2018-10-20 ENCOUNTER — Telehealth: Payer: Self-pay | Admitting: *Deleted

## 2018-10-20 LAB — PSA: Prostate Specific Ag, Serum: <0.1 ng/mL (ref 0.0–4.0)

## 2018-10-20 NOTE — Telephone Encounter (Addendum)
Left patient a Vm with results per Dr. Erlene Quan. Asked to call patient with any questions.    ----- Message from Hollice Espy, MD sent at 10/20/2018 11:33 AM EDT ----- PSA is undetectable, great news!  Hollice Espy, MD

## 2018-10-20 NOTE — Progress Notes (Signed)
10/19/2018  CC:  Chief Complaint  Patient presents with  . 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  He initially presented with a markedly elevated PSA to 56.7 with a rockhard nodular prostate consistent with clinical prostate cancer. He also was in urinary retention unable to void spontaneously at the time.  He was started ondegerelixand underwent multiple failed voiding trials. Ultimately, he started on CIC and underwent prostate biopsy on 09/23/2017.  This revealed a 12 of 12 cores positive of high risk disease, Gleason 4+5 and 4+4 in all cores involving up to 100% of the tissue.  CT abdomen pelvis with contrast shows an irregular mass extending from the peripheral aspect of the prostate involving the adjacent base of the bladder. He also had an indeterminate sclerotic lesion of the femoral neck and L3 vertebral body. There is no significant pelvic adenopathy or any other concerning for metastatic disease. He underwent bone scan which was unremarkable.  He is now completed EBRT (completed 12/2017) and continues on ADT.  He is due for six-month injection today.  PSA drawn today and pending.  Hot flashes decreasing.  Taking calcium/ vit D supplements.  Around the time of his prostate cancer diagnosis, he developed worsening difficulty voiding and ultimately was managed with self cath.  Now at the 97-month interval after completing EBRT, he presents for cystoscopy to evaluate his prostatic urethral anatomy with consideration for channel TURP.  However, the patient reports today that he has been voiding spontaneously without difficulty of the past several months it is no longer having a self cath.  He occasionally self cath to make sure that he is emptying which he notes very low residuals.  Blood pressure 131/68, pulse 91, height  6' (1.829 m), weight 245 lb (111.1 kg). NED. A&Ox3.   No respiratory distress   Abd soft, NT, ND Normal phallus with bilateral descended testicles  Cystoscopy Procedure Note  Patient identification was confirmed, informed consent was obtained, and patient was prepped using Betadine solution.  Lidocaine jelly was administered per urethral meatus.     Pre-Procedure: - Inspection reveals a normal caliber ureteral meatus.  Procedure: The flexible cystoscope was introduced without difficulty - No urethral strictures/lesions are present. - Enlarged prostate with trilobar coaptation - Normal bladder neck - Bilateral ureteral orifices identified - Bladder mucosa  reveals no ulcers, tumors, or lesions.  There are a few nonspecific areas of erythema near the bladder neck which are benign in appearance. - No bladder stones -Moderate  trabeculation  Retroflexion shows no descrete median lobe  Post-Procedure: - Patient tolerated the procedure well  Assessment/ Plan:  1. Prostate cancer ([place]) History of high risk prostate cancer on ADT x2 to 3 years PSA today Lupron given today, 66-month [place]ot Follow-up in 6 months PSA prior To bone health recommendations - Urinalysis, Complete - PSA - Leuprolide Acetate (6 Month) (LUPRON) injection 45 mg  2. BPH with obstruction/lower urinary tract symptoms Mildly obstructive appearing prostate with sequela of outlet obstruction including moderate trabeculation In the interim, his voiding complaints of subsided and is no longer in urinary retention-no further need for CIC At this point time, he is in fact not interested in channel TURP but may consider this in the future if his urinary symptoms worsen  Return in about 6 months (around 04/21/2019) for 78month psa prior, lupron.  Caryl Pina  Erlene Quan, MD

## 2018-10-26 ENCOUNTER — Other Ambulatory Visit: Payer: Self-pay | Admitting: Urology

## 2018-12-01 ENCOUNTER — Inpatient Hospital Stay: Payer: Medicare Other

## 2018-12-08 ENCOUNTER — Ambulatory Visit: Payer: Medicare Other | Admitting: Radiation Oncology

## 2018-12-12 IMAGING — CT CT ABD-PELV W/ CM
2 of 5 series · 15 of 46 positions shown, 17 images · IV contrast (APPLIED)
Comparison: None.

ADDENDUM:
Addendum for correction of findings:

Gallbladder is normal in appearance. No intrahepatic or extrahepatic
biliary ductal dilatation. No surrounding inflammatory stranding.
CLINICAL DATA: Patient with history of prostate cancer. Staging
evaluation.
EXAM:
CT ABDOMEN AND PELVIS WITH CONTRAST
TECHNIQUE: Multidetector CT imaging of the abdomen and pelvis was performed
using the standard protocol following bolus administration of
intravenous contrast.
CONTRAST:  100mL 7I846O-DMM IOPAMIDOL (7I846O-DMM) INJECTION 61%

[Series 2: routine abd/pel with · axial · 0.88mm/px · z∈[-948,-518]mm · 12 of 96 slices shown, 14 images]
[im 5/96  soft-tissue]
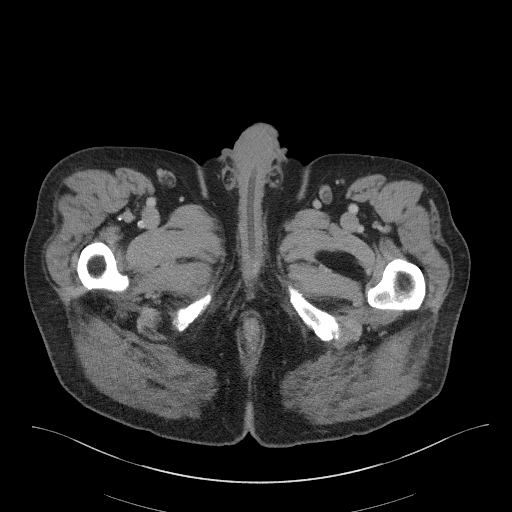
[im 5/96  bone]
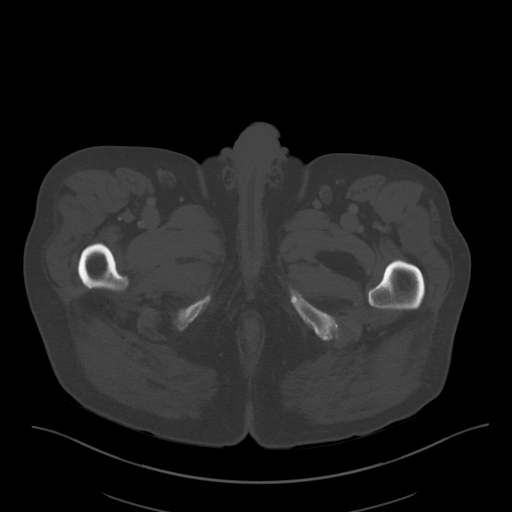
[im 15/96  soft-tissue]
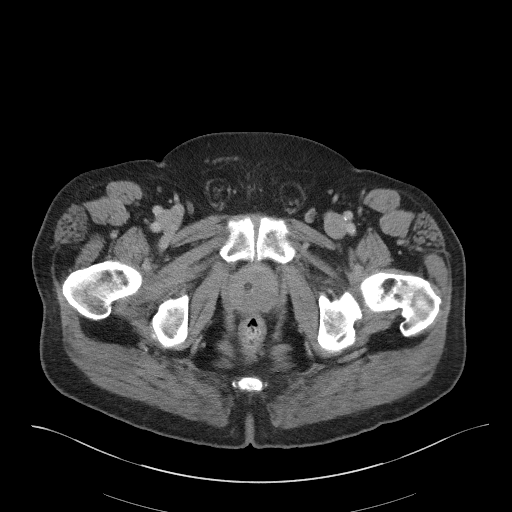
[im 20/96  soft-tissue]
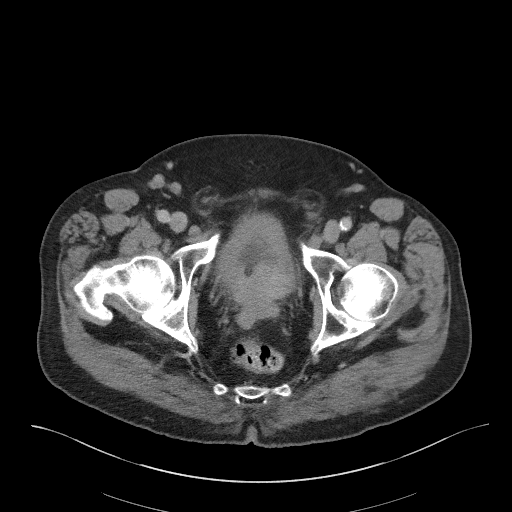
[im 29/96  soft-tissue]
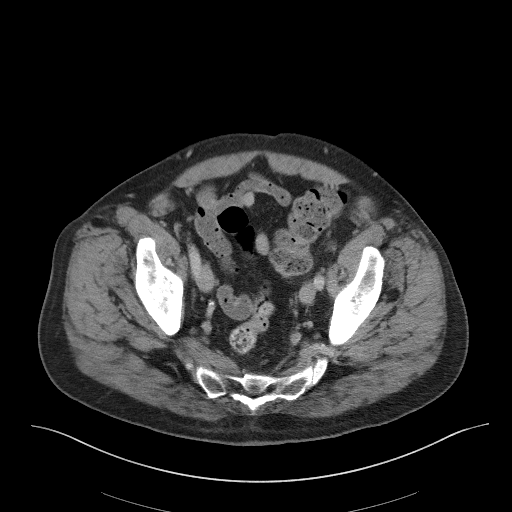
[im 39/96  soft-tissue]
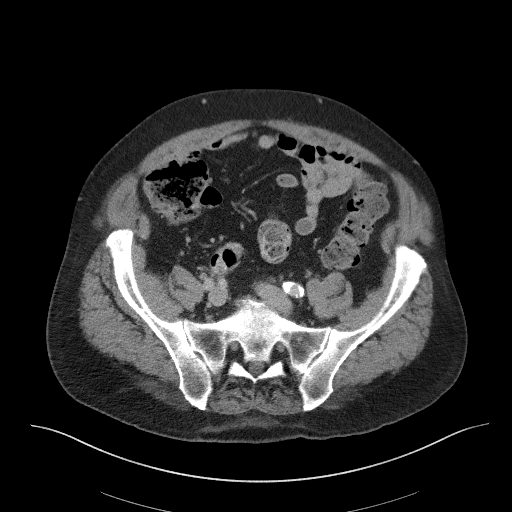
[im 43/96  soft-tissue]
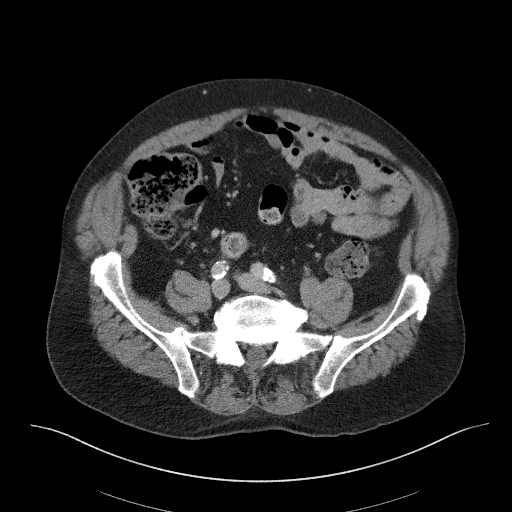
[im 53/96  soft-tissue]
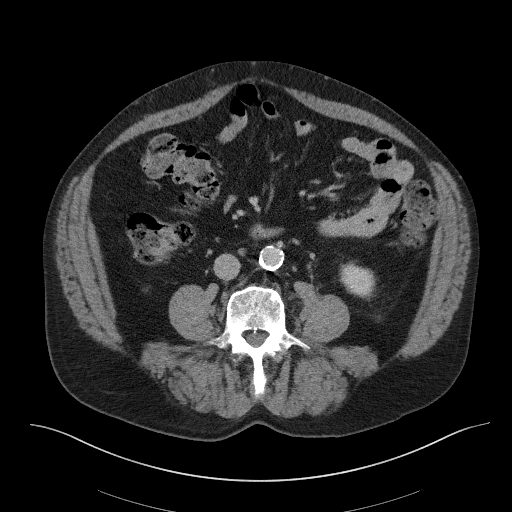
[im 58/96  soft-tissue]
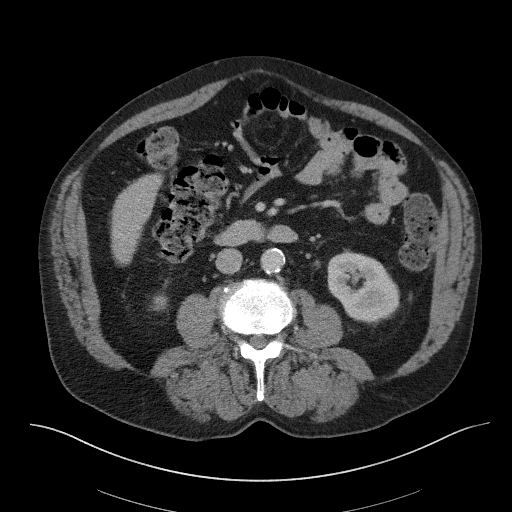
[im 67/96  soft-tissue]
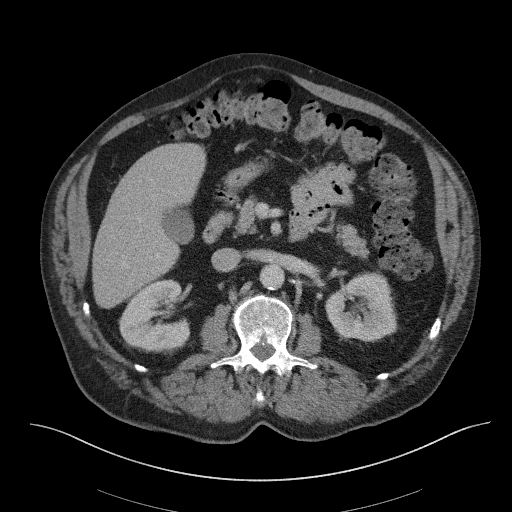
[im 67/96  bone]
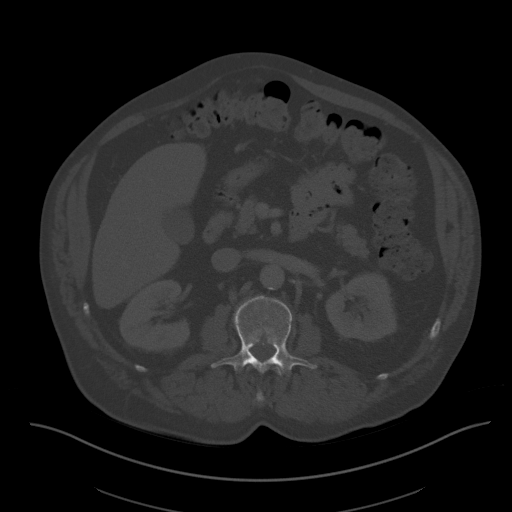
[im 77/96  soft-tissue]
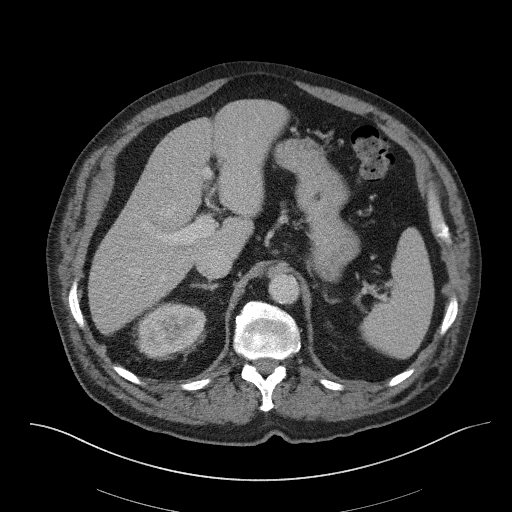
[im 81/96  soft-tissue]
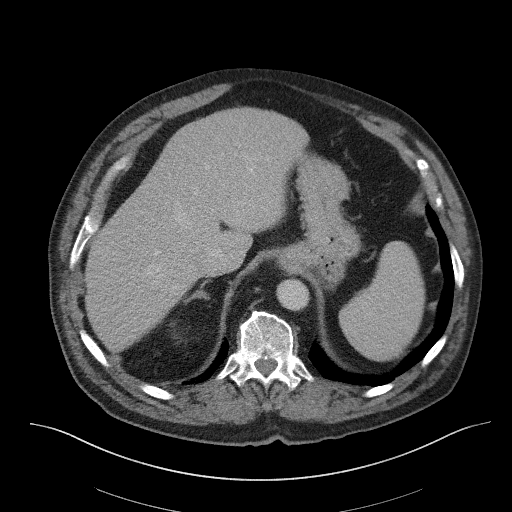
[im 91/96  soft-tissue]
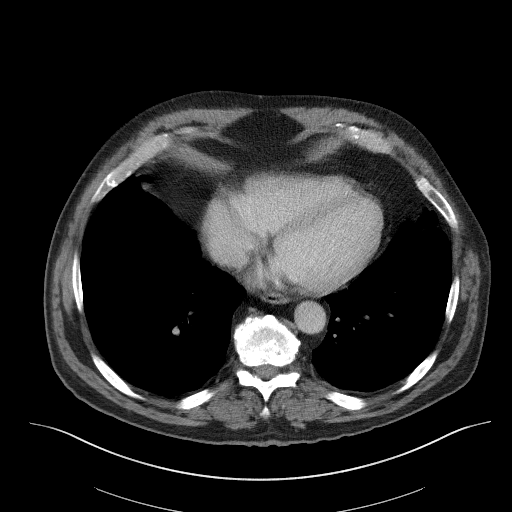

[Series 6: coronal st · coronal · 0.79mm/px · 3 of 109 slices shown]
[im 37/109  soft-tissue]
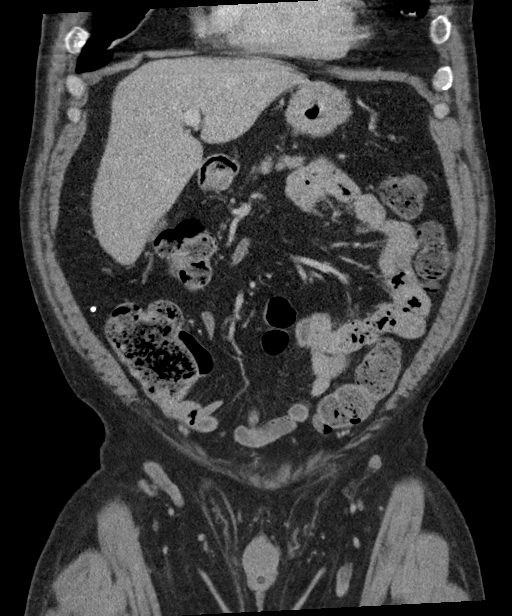
[im 49/109  soft-tissue]
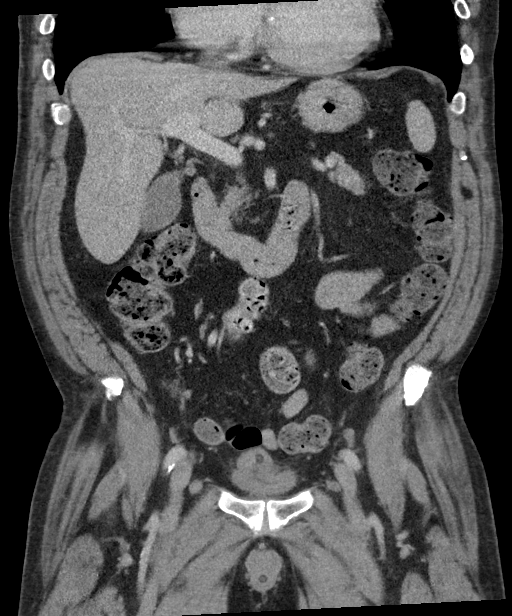
[im 61/109  soft-tissue]
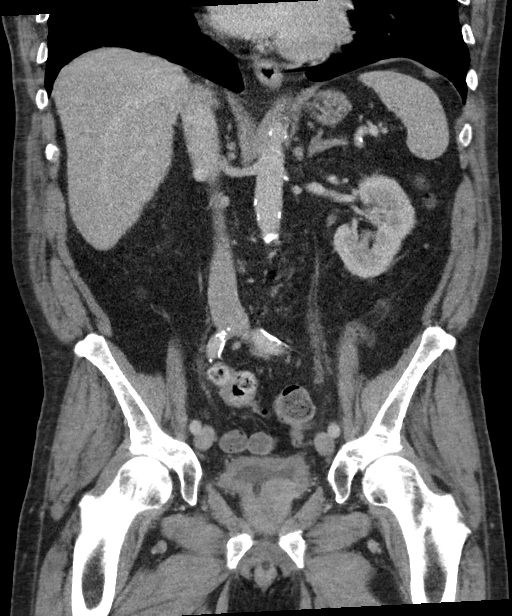

[15 of 46 positions shown; findings below may reference images not displayed]

FINDINGS: Lower chest: Normal heart size. Dependent atelectasis within the
bilateral lower lobes. No pleural effusion.

Hepatobiliary: The liver is normal in size and contour. No focal
hepatic lesion is identified. Gallbladder surgically absent. No
intrahepatic or extrahepatic biliary ductal dilatation.

Pancreas: Unremarkable

Spleen: Unremarkable

Adrenals/Urinary Tract: Adrenal glands are normal. Kidneys enhance
symmetrically with contrast. There is a 2.8 cm simple cyst within
the interpolar region of the right kidney. Marked wall thickening of
the urinary bladder. There is heterogeneous enhancing masslike
tissue involving the posterior left urinary bladder wall (image 77;
series 2) measuring up to 2.5 cm in thickness.

Stomach/Bowel: Small hiatal hernia. Normal morphology of the
stomach. No evidence for small bowel obstruction. No free fluid or
free intraperitoneal air. Normal appendix.

Vascular/Lymphatic: Normal caliber abdominal aorta. Peripheral
calcified atherosclerotic plaque. No retroperitoneal or pelvic
lymphadenopathy. Prominent subcentimeter lymph nodes are
demonstrated.

Reproductive: Irregular masslike thickening involving the superior
and left aspect of the prostate gland, a portion of which appears
contiguous with the posterior left bladder base.

Other: Small fat containing inguinal hernias bilaterally.

Musculoskeletal: Indeterminate 1.2 cm sclerotic lesion within the
left femoral neck (image 85; series 2). Indeterminate sclerotic
lesion within the L3 vertebral body measuring 0.9 cm (image 62;
series 7). Multilevel degenerative disc disease of the lumbar spine.
IMPRESSION: 1. Irregular mass extending off the peripheral aspect of the
prostate gland and appearing to involve the adjacent base of the
urinary bladder, most compatible with primary prostate carcinoma.
2. Indeterminate sclerotic lesion within the left femoral neck and
L3 vertebral body. Osseous metastatic lesions are not entirely
excluded.
3. No retroperitoneal or pelvic lymphadenopathy.

## 2018-12-12 IMAGING — NM NM BONE WHOLE BODY
2 series · 6 of 6 positions shown · non-contrast
Comparison: None

Correlation: CT abdomen and pelvis 08/13/2017

CLINICAL DATA: Prostate cancer, high risk, staging, history
urethral cancer, COPD, hypertension

EXAM:
NUCLEAR MEDICINE WHOLE BODY BONE SCAN
TECHNIQUE: Whole body anterior and posterior images were obtained approximately
3 hours after intravenous injection of radiopharmaceutical.
RADIOPHARMACEUTICALS:  23.446 mCi Zechnetium-CCm MDP IV

[Series 1000: 3 hr wholebody · 2.40mm/px · 2 of 2 frames shown]
[frame 1/2]
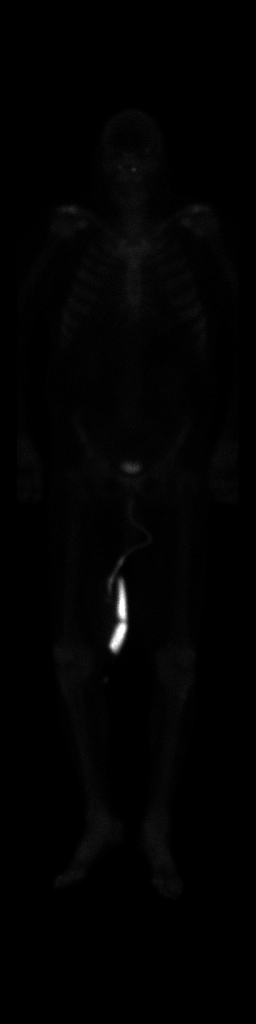
[frame 2/2]
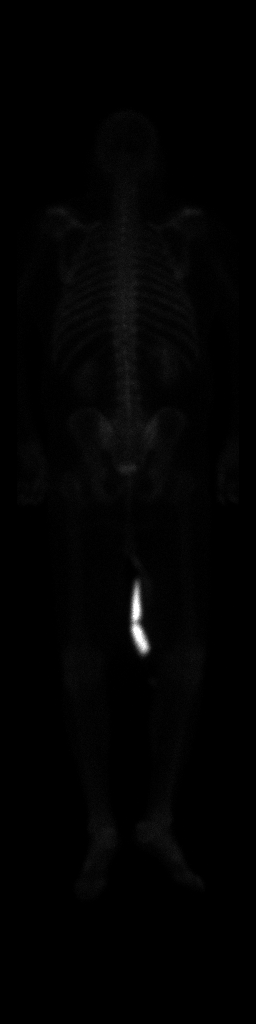

[Series 1000: statics · 2.40mm/px · 2 acquisitions, 4 frames shown]
[im 1/2]
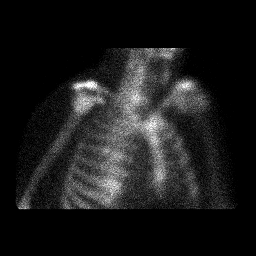
[im 1/2]
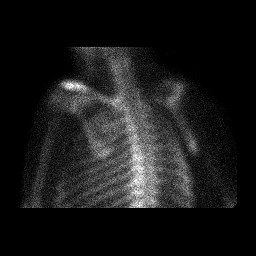
[im 2/2]
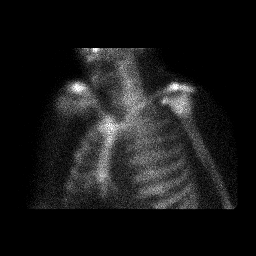
[im 2/2]
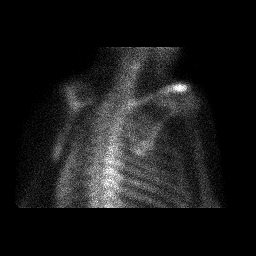

[6 of 6 positions shown; findings below may reference images not displayed]

FINDINGS: Mild uptake at the shoulders and sternoclavicular joints typically
degenerative.

Uptake at the paranasal sinus region, nonspecific.

No concerning areas of abnormal osseous tracer accumulation are
identified to suggest osseous metastatic disease.

Urinary tract and soft tissue distribution of tracer unremarkable.
IMPRESSION: No scintigraphic evidence of osseous metastatic disease.

## 2019-02-08 ENCOUNTER — Other Ambulatory Visit: Payer: Self-pay

## 2019-02-09 ENCOUNTER — Other Ambulatory Visit: Payer: Self-pay

## 2019-02-09 ENCOUNTER — Ambulatory Visit
Admission: RE | Admit: 2019-02-09 | Discharge: 2019-02-09 | Disposition: A | Discharge status: Home / Self Care | Payer: Medicare Other | Source: Ambulatory Visit | Attending: Radiation Oncology | Admitting: Radiation Oncology

## 2019-02-09 ENCOUNTER — Encounter: Payer: Self-pay | Admitting: Radiation Oncology

## 2019-02-09 VITALS — BP 134/54 | HR 83 | Temp 98.5°F | Resp 20 | Wt 248.5 lb

## 2019-02-09 DIAGNOSIS — C61 Malignant neoplasm of prostate: Secondary | ICD-10-CM | POA: Insufficient documentation

## 2019-02-09 DIAGNOSIS — Z923 Personal history of irradiation: Secondary | ICD-10-CM | POA: Diagnosis not present

## 2019-02-09 NOTE — Progress Notes (Signed)
Radiation Oncology Follow up Note  Name: Christopher Mendez   Date:   02/09/2019 MRN:  [id number] DOB: [birth year]    This 68 y.o. male presents to the clinic today for 69-month follow-up status post IMRT radiation therapy to his prostate and pelvic nodes for Gleason 9 (4+5) adenocarcinoma the prostate presenting with a PSA of 56.  REFERRING PROVIDER: Frazier Richards, MD  HPI: Patient is a 68 year old male now at 10 months having completed radiation therapy to both his prostate and pelvic nodes as well as androgen deprivation therapy for a Gleason 9 (4+5) adenocarcinoma the prostate presenting with a PSA of 56.  He is seen today in routine follow-up is doing well specifically denies any increased lower urinary tract symptoms diarrhea fatigue or bone pain.  His most recent PSA.  Back in May was less than 0.1.  COMPLICATIONS OF TREATMENT: none  FOLLOW UP COMPLIANCE: keeps appointments   PHYSICAL EXAM:  BP (!) 134/54 (BP Location: Left Arm, Patient Position: Sitting)   Pulse 83   Temp 98.5 F (36.9 C) (Tympanic)   Resp 20   Wt 248 lb 8 oz (112.7 kg)   BMI 33.70 kg/m  Well-developed well-nourished patient in NAD. HEENT reveals PERLA, EOMI, discs not visualized.  Oral cavity is clear. No oral mucosal lesions are identified. Neck is clear without evidence of cervical or supraclavicular adenopathy. Lungs are clear to A&P. Cardiac examination is essentially unremarkable with regular rate and rhythm without murmur rub or thrill. Abdomen is benign with no organomegaly or masses noted. Motor sensory and DTR levels are equal and symmetric in the upper and lower extremities. Cranial nerves II through XII are grossly intact. Proprioception is intact. No peripheral adenopathy or edema is identified. No motor or sensory levels are noted. Crude visual fields are within normal range.  RADIOLOGY RESULTS: No current films for review  PLAN: Present time patient is doing well under excellent biochemical control  of his prostate cancer.  I have asked to see him back in 1 year for follow-up.  He continues follow-up with urology and will discontinue receive androgen deprivation therapy through their department.  Patient knows to call with any concerns at any time.  I would like to take this opportunity to thank you for allowing me to participate in the care of your patient.Noreene Filbert, MD

## 2019-04-18 ENCOUNTER — Other Ambulatory Visit: Payer: Self-pay

## 2019-04-18 ENCOUNTER — Other Ambulatory Visit: Payer: Medicare Other

## 2019-04-18 DIAGNOSIS — N138 Other obstructive and reflux uropathy: Secondary | ICD-10-CM

## 2019-04-19 LAB — PSA: Prostate Specific Ag, Serum: <0.1 ng/mL (ref 0.0–4.0)

## 2019-04-25 ENCOUNTER — Other Ambulatory Visit: Payer: Self-pay

## 2019-04-25 ENCOUNTER — Ambulatory Visit (INDEPENDENT_AMBULATORY_CARE_PROVIDER_SITE_OTHER): Payer: Medicare Other | Admitting: Urology

## 2019-04-25 ENCOUNTER — Encounter: Payer: Self-pay | Admitting: Urology

## 2019-04-25 VITALS — BP 108/65 | HR 93 | Ht 72.0 in | Wt 235.0 lb

## 2019-04-25 DIAGNOSIS — N138 Other obstructive and reflux uropathy: Secondary | ICD-10-CM | POA: Diagnosis not present

## 2019-04-25 DIAGNOSIS — C61 Malignant neoplasm of prostate: Secondary | ICD-10-CM

## 2019-04-25 DIAGNOSIS — N401 Enlarged prostate with lower urinary tract symptoms: Secondary | ICD-10-CM

## 2019-04-25 LAB — URINALYSIS, COMPLETE
Bilirubin, UA: NEGATIVE
Glucose, UA: NEGATIVE
Ketones, UA: NEGATIVE
Leukocytes,UA: NEGATIVE
Nitrite, UA: NEGATIVE
Protein,UA: NEGATIVE
RBC, UA: NEGATIVE
Specific Gravity, UA: 1.025 (ref 1.005–1.030)
Urobilinogen, Ur: 0.2 mg/dL (ref 0.2–1.0)
pH, UA: 5.5 (ref 5.0–7.5)

## 2019-04-25 LAB — MICROSCOPIC EXAMINATION
Bacteria, UA: NONE SEEN
Epithelial Cells (non renal): NONE SEEN /hpf (ref 0–10)
RBC, Urine: NONE SEEN /hpf (ref 0–2)

## 2019-04-25 MED ORDER — LEUPROLIDE ACETATE (6 MONTH) 45 MG ~~LOC~~ KIT
45.0000 mg | PACK | Freq: Once | SUBCUTANEOUS | Status: AC
  Administered 2019-04-25: 45 mg via SUBCUTANEOUS

## 2019-04-25 NOTE — Patient Instructions (Signed)
Discussed importance of bone health on ADT, recommend 1000-1200 mg daily calcium suppliment and 800-1000 IU vit D daily.  Also encouraged weight being exercises and cardiovascular health.  

## 2019-04-25 NOTE — Progress Notes (Signed)
04/25/2019 5:07 PM   Christopher Mendez June 16, 1950 [id number]  Referring provider: Frazier Richards, [address] [place],  [place] [address]  Chief Complaint  Patient presents with  . Prostate Cancer    49month    HPI: 68 year old male with a history of high risk prostate cancer status post XRT currently on ADT, history of urinary retention who presents today for routine 20-month follow-up.  He initially presented with a markedly elevated PSA to 56.7 with a rockhard nodular prostate consistent with clinical prostate cancer. He also was in urinary retention unable to void spontaneously at the time.  He was started ondegerelixand underwent multiple failed voiding trials. Ultimately, he started on CIC and underwent prostate biopsy on 09/23/2017.  This revealed a 12 of 12 cores positive of high risk disease, Gleason 4+5 and 4+4 in all cores involving up to 100% of the tissue.  CT abdomen pelvis with contrast shows an irregular mass extending from the peripheral aspect of the prostate involving the adjacent base of the bladder. He also had an indeterminate sclerotic lesion of the femoral neck and L3 vertebral body. There is no significant pelvic adenopathy or any other concerning for metastatic disease. He underwent bone scan which was unremarkable.  He is now completed EBRT(completed 12/2017)and continues on ADT. He is due for six-month injection today.    He has been tolerating ADT without difficulty.  He has minimal to no hot flashes.  He is now also is voiding spontaneously and has been not cathing over the past 6+ months.  He feels like his stream is good he is emptying well.  He has no urinary complaints today.  He has been doing weightbearing exercises.  He is taking a daily multivitamin unsure the doses of calcium and vitamin D he has been consuming.  PSA as of 04/18/2019 is undetectable.   PMH: Past Medical History:  Diagnosis Date  . Cardiac enlargement   .  COPD (chronic obstructive pulmonary disease) ([place])   . HTN (hypertension)     Surgical History: No past surgical history on file.  Home Medications:  Allergies as of 04/25/2019      Reactions   Flomax [tamsulosin Hcl] Rash   Myrbetriq [mirabegron] Rash      Medication List       Accurate as of April 25, 2019  5:07 PM. If you have any questions, ask your nurse or doctor.        amLODipine 10 MG tablet Commonly known as: NORVASC Take 10 mg by mouth daily.   Fluticasone-Salmeterol 500-50 MCG/DOSE Aepb Commonly known as: ADVAIR Inhale 1 puff into the lungs 2 (two) times daily.   hydrochlorothiazide 25 MG tablet Commonly known as: HYDRODIURIL Take 25 mg by mouth daily.   ipratropium-albuterol 0.5-2.5 (3) MG/3ML Soln Commonly known as: DUONEB Take 3 mLs by nebulization every 6 (six) hours as needed (wheezing, shortness of breath).   lisinopril 40 MG tablet Commonly known as: ZESTRIL Take 40 mg by mouth daily.   multivitamin tablet Take 1 tablet by mouth daily.   tiotropium 18 MCG inhalation capsule Commonly known as: SPIRIVA Place 18 mcg into inhaler and inhale daily.   triamcinolone cream 0.1 % Commonly known as: KENALOG Apply 1 application topically 2 (two) times daily.       Allergies:  Allergies  Allergen Reactions  . Flomax [Tamsulosin Hcl] Rash  . Myrbetriq [Mirabegron] Rash    Family History: Family History  Problem Relation Age of Onset  .  Hypertension Mother   . Diabetes Father     Social History:  reports that he has been smoking cigarettes. He has a 56.00 pack-year smoking history. He has never used smokeless tobacco. He reports current alcohol use of about 42.0 standard drinks of alcohol per week. He reports that he does not use drugs.  ROS: UROLOGY Frequent Urination?: No Hard to postpone urination?: No Burning/pain with urination?: No Get up at night to urinate?: Yes Leakage of urine?: No Urine stream starts and stops?: No  Trouble starting stream?: No Do you have to strain to urinate?: No Blood in urine?: No Urinary tract infection?: No Sexually transmitted disease?: No Injury to kidneys or bladder?: No Painful intercourse?: No Weak stream?: No Erection problems?: No Penile pain?: No  Gastrointestinal Nausea?: No Vomiting?: No Indigestion/heartburn?: No Diarrhea?: No Constipation?: No  Constitutional Fever: No Night sweats?: No Weight loss?: No Fatigue?: No  Skin Skin rash/lesions?: No Itching?: No  Eyes Blurred vision?: No Double vision?: No  Ears/Nose/Throat Sore throat?: No Sinus problems?: No  Hematologic/Lymphatic Swollen glands?: No Easy bruising?: No  Cardiovascular Leg swelling?: No Chest pain?: No  Respiratory Cough?: No Shortness of breath?: No  Endocrine Excessive thirst?: No  Musculoskeletal Back pain?: No Joint pain?: No  Neurological Headaches?: No Dizziness?: No  Psychologic Depression?: No Anxiety?: No  Physical Exam: BP 108/65   Pulse 93   Ht 6' (1.829 m)   Wt 235 lb (106.6 kg)   BMI 31.87 kg/m   Constitutional:  Alert and oriented, No acute distress. HEENT: [place] AT, moist mucus membranes.  Trachea midline, no masses. Cardiovascular: No clubbing, cyanosis, or edema. Respiratory: Normal respiratory effort, no increased work of breathing. GI: Abdomen is soft, nontender, nondistended, no abdominal masses GU: No CVA tenderness Lymph: No cervical or inguinal lymphadenopathy. Skin: No rashes, bruises or suspicious lesions. Neurologic: Grossly intact, no focal deficits, moving all 4 extremities. Psychiatric: Normal mood and affect.  Laboratory Data: Lab Results  Component Value Date   WBC 6.0 12/16/2017   HGB 13.5 12/16/2017   HCT 39.8 (L) 12/16/2017   MCV 84.9 12/16/2017   PLT 274 12/16/2017    Lab Results  Component Value Date   CREATININE 1.14 07/07/2017    Lab Results  Component Value Date   TESTOSTERONE 20 (L) 09/14/2017     Urinalysis Results for orders placed or performed in visit on 04/25/19  Microscopic Examination   URINE  Result Value Ref Range   WBC, UA 0-5 0 - 5 /hpf   RBC None seen 0 - 2 /hpf   Epithelial Cells (non renal) None seen 0 - 10 /hpf   Bacteria, UA None seen None seen/Few  Urinalysis, Complete  Result Value Ref Range   Specific Gravity, UA 1.025 1.005 - 1.030   pH, UA 5.5 5.0 - 7.5   Color, UA Yellow Yellow   Appearance Ur Clear Clear   Leukocytes,UA Negative Negative   Protein,UA Negative Negative/Trace   Glucose, UA Negative Negative   Ketones, UA Negative Negative   RBC, UA Negative Negative   Bilirubin, UA Negative Negative   Urobilinogen, Ur 0.2 0.2 - 1.0 mg/dL   Nitrite, UA Negative Negative   Microscopic Examination See below:      Assessment & Plan:    1. Prostate cancer [facility]) History of high risk prostate cancer status post IMRT now on ADT for 2 to 3 years Eligard was given today in lieu of Lupron due to national shortage, will continue on a every  6 month basis We will reassess next year whether or not to continue his medication at the 2-year mark Tolerating very well Reminded about bone health and weightbearing exercises PSA remains undetectable - leuprolide (6 Month) (ELIGARD) injection 45 mg - Urinalysis, Complete  2. BPH with obstruction/lower urinary tract symptoms Voiding spontaneously with adequate control of his prostate cancer Previously consider channel TURP but given that his symptoms are well controlled, will hold off for the time being He is agreeable this plan He will resume CIC as needed if he has any recurrent concerns for retention   Return in about 6 months (around 10/24/2019) for PSA/ luprolide/ IPSS/ PVR.  Hollice Espy, MD  [facility] [address] [place], [place] [address] [phone]  I spent 25 min with this patient of which greater than 50% was spent in counseling and coordination of  care with the patient.

## 2019-04-25 NOTE — Progress Notes (Signed)
Eligard SubQ Injection   Due to Prostate Cancer patient is present today for a Eligard Injection.  Medication: Eligard 6 month Dose: 45 mg  Location: left arm Lot: [id number] Exp: 10/2020  Patient tolerated well, no complications were noted  Performed by: Fonnie Jarvis, CMA  Per Dr. Erlene Quan patient is to continue therapy for 2-3years, he has completed 1year so far . Patient's next follow up was scheduled for 10/24/2019. This appointment was scheduled using wheel and given to patient today along with reminder continue on Vitamin D 800-1000iu and Calium 1000-1200mg  daily while on Androgen Deprivation Therapy.

## 2019-06-07 ENCOUNTER — Ambulatory Visit
Admission: RE | Admit: 2019-06-07 | Discharge: 2019-06-07 | Disposition: A | Discharge status: Home / Self Care | Payer: Medicare Other | Source: Ambulatory Visit | Attending: Internal Medicine | Admitting: Internal Medicine

## 2019-06-07 ENCOUNTER — Other Ambulatory Visit: Payer: Self-pay | Admitting: Internal Medicine

## 2019-06-07 ENCOUNTER — Other Ambulatory Visit: Payer: Self-pay

## 2019-06-07 ENCOUNTER — Encounter: Payer: Medicare Other | Attending: Internal Medicine | Admitting: Internal Medicine

## 2019-06-07 ENCOUNTER — Other Ambulatory Visit
Admission: RE | Admit: 2019-06-07 | Discharge: 2019-06-07 | Disposition: A | Discharge status: Home / Self Care | Payer: Medicare Other | Source: Ambulatory Visit | Attending: Internal Medicine | Admitting: Internal Medicine

## 2019-06-07 DIAGNOSIS — Z8546 Personal history of malignant neoplasm of prostate: Secondary | ICD-10-CM | POA: Insufficient documentation

## 2019-06-07 DIAGNOSIS — L97514 Non-pressure chronic ulcer of other part of right foot with necrosis of bone: Secondary | ICD-10-CM | POA: Diagnosis present

## 2019-06-07 DIAGNOSIS — Z809 Family history of malignant neoplasm, unspecified: Secondary | ICD-10-CM | POA: Insufficient documentation

## 2019-06-07 DIAGNOSIS — Z833 Family history of diabetes mellitus: Secondary | ICD-10-CM | POA: Insufficient documentation

## 2019-06-07 DIAGNOSIS — L03115 Cellulitis of right lower limb: Secondary | ICD-10-CM | POA: Insufficient documentation

## 2019-06-07 DIAGNOSIS — S81801A Unspecified open wound, right lower leg, initial encounter: Secondary | ICD-10-CM

## 2019-06-07 DIAGNOSIS — G4730 Sleep apnea, unspecified: Secondary | ICD-10-CM | POA: Diagnosis not present

## 2019-06-07 DIAGNOSIS — I252 Old myocardial infarction: Secondary | ICD-10-CM | POA: Diagnosis not present

## 2019-06-07 DIAGNOSIS — F17200 Nicotine dependence, unspecified, uncomplicated: Secondary | ICD-10-CM | POA: Insufficient documentation

## 2019-06-07 DIAGNOSIS — J449 Chronic obstructive pulmonary disease, unspecified: Secondary | ICD-10-CM | POA: Diagnosis not present

## 2019-06-07 DIAGNOSIS — Z8249 Family history of ischemic heart disease and other diseases of the circulatory system: Secondary | ICD-10-CM | POA: Insufficient documentation

## 2019-06-07 DIAGNOSIS — I10 Essential (primary) hypertension: Secondary | ICD-10-CM | POA: Insufficient documentation

## 2019-06-07 NOTE — Progress Notes (Signed)
Christopher Mendez, Christopher Mendez ([id number]) Visit Report for 06/07/2019 Abuse/Suicide Risk Screen Details Patient Name: Christopher Mendez, Christopher Mendez. Date of Service: 06/07/2019 2:15 PM Medical Record Number: [id number] Patient Account Number: [id number] Date of Birth/Sex: 1950/05/27 (69 y.o. M) Treating RN: Army Melia Primary Care Areatha Kalata: Beverlyn Roux Other Clinician: Referring Teofil Maniaci: Delight Stare Treating Leverett Camplin/Extender: Tito Dine in Treatment: 0 Abuse/Suicide Risk Screen Items Answer ABUSE RISK SCREEN: Has anyone close to you tried to hurt or harm you recentlyo No Do you feel uncomfortable with anyone in your familyo No Has anyone forced you do things that you didnot want to doo No Electronic Signature(s) Signed: 06/07/2019 3:13:50 PM By: Army Melia Entered By: Army Melia on 06/07/2019 14:34:20 Christopher Mendez ([id number]) -------------------------------------------------------------------------------- Activities of Daily Living Details Patient Name: Christopher Mendez. Date of Service: 06/07/2019 2:15 PM Medical Record Number: [id number] Patient Account Number: [id number] Date of Birth/Sex: 10-Dec-1950 (70 y.o. M) Treating RN: Army Melia Primary Care Salah Nakamura: Beverlyn Roux Other Clinician: Referring Esmee Fallaw: Delight Stare Treating Sarin Comunale/Extender: Tito Dine in Treatment: 0 Activities of Daily Living Items Answer Activities of Daily Living (Please select one for each item) Drive Automobile Not Able Take Medications Completely Able Use Telephone Completely Able Care for Appearance Completely Able Use Toilet Completely Able Bath / Shower Completely Able Dress Self Completely Able Feed Self Completely Able Walk Completely Able Get In / Out Bed Completely Able Housework Completely Able Prepare Meals Completely [place] for Self Completely Able Electronic Signature(s) Signed: 06/07/2019 3:13:50 PM By: Army Melia Entered  By: Army Melia on 06/07/2019 14:34:30 Christopher Mendez ([id number]) -------------------------------------------------------------------------------- Education Screening Details Patient Name: Christopher Mendez. Date of Service: 06/07/2019 2:15 PM Medical Record Number: [id number] Patient Account Number: [id number] Date of Birth/Sex: 02-04-1951 (69 y.o. M) Treating RN: Army Melia Primary Care Abdulrahman Bracey: Beverlyn Roux Other Clinician: Referring Gavriel Holzhauer: Delight Stare Treating Maiana Hennigan/Extender: Tito Dine in Treatment: 0 Primary Learner Assessed: Patient Learning Preferences/Education Level/Primary Language Learning Preference: Explanation Highest Education Level: High School Preferred Language: English Cognitive Barrier Language Barrier: No Translator Needed: No Memory Deficit: No Emotional Barrier: No Cultural/Religious Beliefs Affecting Medical Care: No Physical Barrier Impaired Vision: No Impaired Hearing: No Decreased Hand dexterity: No Knowledge/Comprehension Knowledge Level: High Comprehension Level: High Ability to understand written High instructions: Ability to understand verbal High instructions: Motivation Anxiety Level: Calm Cooperation: Cooperative Education Importance: Acknowledges Need Interest in Health Problems: Asks Questions Perception: Coherent Willingness to Engage in Self- High Management Activities: Readiness to Engage in Self- High Management Activities: Electronic Signature(s) Signed: 06/07/2019 3:13:50 PM By: Army Melia Entered By: Army Melia on 06/07/2019 14:34:49 Christopher Mendez ([id number]) -------------------------------------------------------------------------------- Fall Risk Assessment Details Patient Name: Christopher Mendez. Date of Service: 06/07/2019 2:15 PM Medical Record Number: [id number] Patient Account Number: [id number] Date of Birth/Sex: [birth year] (69 y.o. M) Treating RN: Army Melia Primary Care  Oshua Mcconaha: Beverlyn Roux Other Clinician: Referring Honest Vanleer: Delight Stare Treating Yojan Paskett/Extender: Tito Dine in Treatment: 0 Fall Risk Assessment Items Have you had 2 or more falls in the last 12 monthso 0 No Have you had any fall that resulted in injury in the last 12 monthso 0 No FALLS RISK SCREEN History of falling - immediate or within 3 months 0 No Secondary diagnosis (Do you have 2 or more medical diagnoseso) 0 No Ambulatory aid None/bed rest/wheelchair/nurse 0 No Crutches/cane/walker 0 No Furniture 0 No Intravenous therapy Access/Saline/Heparin Lock 0 No Gait/Transferring Normal/ bed rest/ wheelchair 0 No Weak (short  steps with or without shuffle, stooped but able to lift head while 0 No walking, may seek support from furniture) Impaired (short steps with shuffle, may have difficulty arising from chair, head 0 No down, impaired balance) Mental Status Oriented to own ability 0 No Electronic Signature(s) Signed: 06/07/2019 3:13:50 PM By: Army Melia Entered By: Army Melia on 06/07/2019 14:34:53 Christopher Mendez ([id number]) -------------------------------------------------------------------------------- Foot Assessment Details Patient Name: Christopher Mendez. Date of Service: 06/07/2019 2:15 PM Medical Record Number: [id number] Patient Account Number: [id number] Date of Birth/Sex: 1950/10/23 (69 y.o. M) Treating RN: Army Melia Primary Care Heliodoro Domagalski: Beverlyn Roux Other Clinician: Referring Constantinos Krempasky: Delight Stare Treating Abeera Flannery/Extender: Tito Dine in Treatment: 0 Foot Assessment Items Site Locations + = Sensation present, - = Sensation absent, C = Callus, U = Ulcer R = Redness, W = Warmth, M = Maceration, PU = Pre-ulcerative lesion F = Fissure, S = Swelling, D = Dryness Assessment Right: Left: Other Deformity: No No Prior Foot Ulcer: No No Prior Amputation: No No Charcot Joint: No No Ambulatory Status: Ambulatory Without  Help Gait: Steady Electronic Signature(s) Signed: 06/07/2019 3:13:50 PM By: Army Melia Entered By: Army Melia on 06/07/2019 14:35:39 Christopher Mendez ([id number]) -------------------------------------------------------------------------------- Nutrition Risk Screening Details Patient Name: Christopher Mendez. Date of Service: 06/07/2019 2:15 PM Medical Record Number: [id number] Patient Account Number: [id number] Date of Birth/Sex: 28-Nov-1950 (69 y.o. M) Treating RN: Army Melia Primary Care Willis Holquin: Beverlyn Roux Other Clinician: Referring Akima Slaugh: MILES, LINDA Treating Raynelle Fujikawa/Extender: Tito Dine in Treatment: 0 Height (in): 73 Weight (lbs): 238 Body Mass Index (BMI): 31.4 Nutrition Risk Screening Items Score Screening NUTRITION RISK SCREEN: I have an illness or condition that made me change the kind and/or amount of 0 No food I eat I eat fewer than two meals per day 0 No I eat few fruits and vegetables, or milk products 0 No I have three or more drinks of beer, liquor or wine almost every day 0 No I have tooth or mouth problems that make it hard for me to eat 0 No I don't always have enough money to buy the food I need 0 No I eat alone most of the time 0 No I take three or more different prescribed or over-the-counter drugs a day 0 No Without wanting to, I have lost or gained 10 pounds in the last six months 0 No I am not always physically able to shop, cook and/or feed myself 0 No Nutrition Protocols Good Risk Protocol 0 No interventions needed Moderate Risk Protocol High Risk Proctocol Risk Level: Good Risk Score: 0 Electronic Signature(s) Signed: 06/07/2019 3:13:50 PM By: Army Melia Entered By: Army Melia on 06/07/2019 14:34:59

## 2019-06-07 NOTE — Progress Notes (Signed)
SEDARIUS, POLEN ([id number]) Visit Report for 06/07/2019 Chief Complaint Document Details Patient Name: Christopher Mendez, Christopher Mendez. Date of Service: 06/07/2019 2:15 PM Medical Record Number: [id number] Patient Account Number: [id number] Date of Birth/Sex: Sep 29, 1950 (69 y.o. M) Treating RN: Cornell Barman Primary Care Provider: Beverlyn Roux Other Clinician: Referring Provider: Delight Stare Treating Provider/Extender: Tito Dine in Treatment: 0 Information Obtained from: Patient Chief Complaint 06/07/2019; patient is here for a wound on the right plantar foot Electronic Signature(s) Signed: 06/07/2019 4:15:01 PM By: Linton Ham MD Entered By: Linton Ham on 06/07/2019 15:04:51 Christopher Mendez ([id number]) -------------------------------------------------------------------------------- HPI Details Patient Name: Christopher Mendez, Christopher Mendez. Date of Service: 06/07/2019 2:15 PM Medical Record Number: [id number] Patient Account Number: [id number] Date of Birth/Sex: [birth year] (69 y.o. M) Treating RN: Cornell Barman Primary Care Provider: Beverlyn Roux Other Clinician: Referring Provider: Delight Stare Treating Provider/Extender: Tito Dine in Treatment: 0 History of Present Illness HPI Description: ADMISSION 06/07/2019 This is a 69 year old nondiabetic man. He is here accompanied by his wife. He tells me that he had a callus on the right lateral foot over roughly the fifth metatarsal head plantar aspect which he never really bothered about because it did not really bother him. Apparently 2 weeks ago the callus split open and fell off leaving him with a very deep sore with considerable depth. He is already been seen and by his primary physician in the [facility]. Apparently a culture was done and he was prescribed Levaquin for 10 days. We do not have the culture result. Past medical history; prostate CA for which she received radiation and now androgen depletion therapy, COPD,  cardiac enlargement. He is a heavy smoker. ABIs in our clinic were 1.03 Electronic Signature(s) Signed: 06/07/2019 4:15:01 PM By: Linton Ham MD Entered By: Linton Ham on 06/07/2019 15:13:24 Christopher Mendez ([id number]) -------------------------------------------------------------------------------- Physical Exam Details Patient Name: Christopher Mendez, Christopher Mendez. Date of Service: 06/07/2019 2:15 PM Medical Record Number: [id number] Patient Account Number: [id number] Date of Birth/Sex: [birth year] (69 y.o. M) Treating RN: Cornell Barman Primary Care Provider: Beverlyn Roux Other Clinician: Referring Provider: Delight Stare Treating Provider/Extender: Tito Dine in Treatment: 0 Constitutional Sitting or standing Blood Pressure is within target range for patient.. Pulse regular and within target range for patient.Marland Kitchen Respirations regular, non-labored and within target range.. Temperature is normal and within the target range for the patient.Marland Kitchen appears in no distress. Cardiovascular Patel pulses palpable. Pedal pulses were palpable at the dorsalis pedis and posterior tibial.. Integumentary (Hair, Skin) He had some dermatitis in his bilateral dorsal feet in close proximity to the toes I wonder if this is just dry skin eczema versus fungal. Neurological Really no consistent loss of sensation to the microfilament. Psychiatric No evidence of depression, anxiety, or agitation. Calm, cooperative, and communicative. Appropriate interactions and affect.. Notes Wound exam; the areas over the left fifth plantar metatarsal head. Reasonably small opening surrounded by necrotic tissue however this probes to bone at roughly 0.9 cm which is no doubt the metatarsal head. It also has considerable tunneling proximally. There was no purulent drainage nevertheless I cultured the area of undermining. I think there is some swelling and erythema around the wound that I have marked Electronic  Signature(s) Signed: 06/07/2019 4:15:01 PM By: Linton Ham MD Entered By: Linton Ham on 06/07/2019 15:15:28 Christopher Mendez ([id number]) -------------------------------------------------------------------------------- Physician Orders Details Patient Name: Christopher Mendez, Christopher Mendez. Date of Service: 06/07/2019 2:15 PM Medical Record Number: [id number] Patient Account Number: [id number] Date of Birth/Sex: 03-Jun-1950 (69 y.o.  M) Treating RN: Cornell Barman Primary Care Provider: Beverlyn Roux Other Clinician: Referring Provider: Delight Stare Treating Provider/Extender: Tito Dine in Treatment: 0 Verbal / Phone Orders: No Diagnosis Coding Wound Cleansing o Clean wound with Normal Saline. o May Shower, gently pat wound dry prior to applying new dressing. Anesthetic (add to Medication List) Wound #1 Right,Lateral,Plantar Foot o Topical Lidocaine 4% cream applied to wound bed prior to debridement (In Clinic Only). Primary Wound Dressing Wound #1 Right,Lateral,Plantar Foot o Silver Alginate Secondary Dressing Wound #1 Right,Lateral,Plantar Foot o ABD and Kerlix/Conform Dressing Change Frequency Wound #1 Right,Lateral,Plantar Foot o Change dressing every other day. Follow-up Appointments Wound #1 Right,Lateral,Plantar Foot o Return Appointment in 1 week. Edema Control Wound #1 Right,Lateral,Plantar Foot o Elevate legs to the level of the heart and pump ankles as often as possible Off-Loading Wound #1 Right,Lateral,Plantar Foot o Other: [id number] loader Additional Orders / Instructions Wound #1 Right,Lateral,Plantar Foot o Stop Smoking o Increase protein intake. o Other: - Stay off of feet as much as possible. Elevate when sitting. Medications-please add to medication list. Wound #1 Right,Lateral,Plantar Foot o P.O. Antibiotics - Levaquin Christopher Mendez, Christopher Mendez ([id number]) Laboratory o Bacteria identified in Wound by Culture (MICRO) - Right  plantar foot oooo LOINC Code: [id number] oooo Convenience Name: Wound culture routine Radiology o X-ray, foot - 3-view right foot Electronic Signature(s) Signed: 06/07/2019 4:15:01 PM By: Linton Ham MD Signed: 06/07/2019 4:52:26 PM By: Gretta Cool, BSN, RN, CWS, Kim RN, BSN Entered By: Gretta Cool, BSN, RN, CWS, Kim on 06/07/2019 15:02:02 Christopher Mendez, Christopher Mendez ([id number]) -------------------------------------------------------------------------------- Problem List Details Patient Name: Christopher Mendez, Christopher Mendez. Date of Service: 06/07/2019 2:15 PM Medical Record Number: [id number] Patient Account Number: [id number] Date of Birth/Sex: December 26, 1950 (69 y.o. 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 Date of Service: 06/07/2019 2:15 PM Medical Record Number: [id number] Patient Account Number: [id number] Date of Birth/Sex: [birth year] (69 y.o. M) Treating RN: Cornell Barman Primary Care Provider: Beverlyn Roux Other Clinician: Referring Provider: Delight Stare Treating Provider/Extender: Tito Dine in Treatment: 0 Subjective Chief Complaint Information obtained from Patient 06/07/2019; patient is here for a wound on the right plantar foot History of Present Illness  (HPI) ADMISSION 06/07/2019 This is a 69 year old nondiabetic man. He is here accompanied by his wife. He tells me that he had a callus on the right lateral foot over roughly the fifth metatarsal head plantar aspect which he never really bothered about because it did not really bother him. Apparently 2 weeks ago the callus split open and fell off leaving him with a very deep sore with considerable depth. He is already been seen and by his primary physician in the [facility]. Apparently a culture was done and he was prescribed Levaquin for 10 days. We do not have the culture result. Past medical history; prostate CA for which she received radiation and now androgen depletion therapy, COPD, cardiac enlargement. He is a heavy smoker. ABIs in our clinic were 1.03 Patient History Information obtained  from Patient. Allergies No Known Allergies Family History Cancer - Mother, Diabetes - Father, Heart Disease - Father, Hypertension - Father, Lung Disease - Mother, No family history of Kidney Disease, Seizures, Stroke, Thyroid Problems, Tuberculosis. Social History Current every day smoker - 55 years, Marital Status - Married, Alcohol Use - Rarely, Drug Use - No History, Caffeine Use - Daily. Medical History Eyes Patient has history of Cataracts - removed Denies history of Glaucoma, Optic Neuritis Hematologic/Lymphatic Denies history of Anemia, Hemophilia, Human Immunodeficiency Virus, Lymphedema, Sickle Cell Disease Respiratory Patient has history of Asthma, Chronic Obstructive Pulmonary Disease (COPD), Sleep Apnea Denies history of Aspiration, Pneumothorax, Tuberculosis Cardiovascular Patient has history of Hypertension Christopher Mendez, Christopher Mendez. ([id number]) Denies history of Angina, Arrhythmia, Congestive Heart Failure, Coronary Artery Disease, Deep Vein Thrombosis, Hypotension, Myocardial Infarction, Peripheral Arterial Disease, Peripheral Venous Disease, Phlebitis,  Vasculitis Gastrointestinal Denies history of Cirrhosis , Colitis, Crohn s, Hepatitis A, Hepatitis B, Hepatitis C Endocrine Denies history of Type I Diabetes, Type II Diabetes Genitourinary Denies history of End Stage Renal Disease Immunological Denies history of Lupus Erythematosus, Raynaud s, Scleroderma Integumentary (Skin) Denies history of History of Burn, History of pressure wounds Musculoskeletal Denies history of Gout, Rheumatoid Arthritis, Osteoarthritis, Osteomyelitis Neurologic Denies history of Dementia, Neuropathy, Quadriplegia, Paraplegia, Seizure Disorder Oncologic Patient has history of Received Radiation Denies history of Received Chemotherapy Psychiatric Denies history of Anorexia/bulimia, Confinement Anxiety Review of Systems (ROS) Constitutional Symptoms (General Health) Denies complaints or symptoms of Fatigue, Fever, Chills, Marked Weight Change. Eyes Denies complaints or symptoms of Dry Eyes, Vision Changes, Glasses / Contacts. Ear/Nose/Mouth/Throat Denies complaints or symptoms of Difficult clearing ears, Sinusitis. Hematologic/Lymphatic Denies complaints or symptoms of Bleeding / Clotting Disorders, Human Immunodeficiency Virus. Respiratory Denies complaints or symptoms of Chronic or frequent coughs, Shortness of Breath. Cardiovascular Denies complaints or symptoms of Chest pain, LE edema. Gastrointestinal Denies complaints or symptoms of Frequent diarrhea, Nausea, Vomiting. Endocrine Denies complaints or symptoms of Hepatitis, Thyroid disease, Polydypsia (Excessive Thirst). Genitourinary Denies complaints or symptoms of Kidney failure/ Dialysis, Incontinence/dribbling. Immunological Denies complaints or symptoms of Hives, Itching. Integumentary (Skin) Complains or has symptoms of Wounds. Denies complaints or symptoms of Bleeding or bruising tendency, Breakdown, Swelling. Musculoskeletal Denies complaints or symptoms of Muscle Pain, Muscle  Weakness. Neurologic Denies complaints or symptoms of Numbness/parasthesias, Focal/Weakness. Oncologic Prostate cancer Psychiatric Denies complaints or symptoms of Anxiety, Claustrophobia. Christopher Mendez, Christopher Mendez ([id number]) Objective Constitutional Sitting or standing Blood Pressure is within target range for patient.. Pulse regular and within target range for patient.Marland Kitchen Respirations regular, non-labored and within target range.. Temperature is normal and within the target range for the patient.Marland Kitchen appears in no distress. Vitals Time Taken: 2:12 PM, Height: 73 in, Source: Stated, Weight: 238 lbs, Source: Measured, BMI: 31.4, Temperature: 98.4 F, Pulse: 72 bpm, Respiratory Rate: 16 breaths/min, Blood Pressure: 141/58 mmHg. Cardiovascular Patel pulses palpable. Pedal pulses were palpable at the dorsalis pedis and posterior tibial.. Neurological Really no consistent loss of sensation to the microfilament. Psychiatric No evidence of depression, anxiety, or agitation. Calm, cooperative, and communicative. Appropriate interactions and affect.. General Notes: Wound exam; the areas over the left fifth plantar metatarsal head. Reasonably small opening surrounded by necrotic tissue however this probes to bone at roughly 0.9 cm which is no doubt the metatarsal head. It also has considerable tunneling proximally. There was no purulent drainage nevertheless I cultured the area of undermining. I think there is some swelling and erythema around the wound that I have marked Integumentary (Hair, Skin) He had some dermatitis  in his bilateral dorsal feet in close proximity to the toes I wonder if this is just dry skin eczema versus fungal. Wound #1 status is Open. Original cause of wound was Blister. The wound is located on the [place]. The wound measures 1.8cm length x 2cm width x 0.6cm depth; 2.827cm^2 area and 1.696cm^3 volume. There is bone and Fat Layer (Subcutaneous Tissue) Exposed  exposed. There is no tunneling noted, however, there is undermining starting at 1:00 and ending at 6:00 with a maximum distance of 0.8cm. There is a medium amount of serosanguineous drainage noted. The wound margin is flat and intact. There is small (1-33%) pink granulation within the wound bed. There is a large (67-100%) amount of necrotic tissue within the wound bed including Eschar and Adherent Slough. Assessment Active Problems ICD-10 Non-pressure chronic ulcer of other part of right foot with necrosis of bone Cellulitis of right lower limb Plan JAYNE, GERRITS. ([id number]) Wound Cleansing: Clean wound with Normal Saline. May Shower, gently pat wound dry prior to applying new dressing. Anesthetic (add to Medication List): Wound #1 Right,Lateral,Plantar Foot: Topical Lidocaine 4% cream applied to wound bed prior to debridement (In Clinic Only). Primary Wound Dressing: Wound #1 Right,Lateral,Plantar Foot: Silver Alginate Secondary Dressing: Wound #1 Right,Lateral,Plantar Foot: ABD and Kerlix/Conform Dressing Change Frequency: Wound #1 Right,Lateral,Plantar Foot: Change dressing every other day. Follow-up Appointments: Wound #1 Right,Lateral,Plantar Foot: Return Appointment in 1 week. Edema Control: Wound #1 Right,Lateral,Plantar Foot: Elevate legs to the level of the heart and pump ankles as often as possible Off-Loading: Wound #1 Right,Lateral,Plantar Foot: Other: [id number] loader Additional Orders / Instructions: Wound #1 Right,Lateral,Plantar Foot: Stop Smoking Increase protein intake. Other: - Stay off of feet as much as possible. Elevate when sitting. Medications-please add to medication list.: Wound #1 Right,Lateral,Plantar Foot: P.O. Antibiotics - Levaquin Laboratory ordered were: Wound culture routine - Right plantar foot Radiology ordered were: X-ray, foot - 3-view right foot 1. Very concerning wound on the right plantar fifth metatarsal head. This probes  directly to bone. I have cultured this but I have not changed the antibiotic he is on i.e. Levaquin 2. I am uncertain about how this exactly happened this may be predominantly pressure area depending on how we walks. By his description he did have a callus that opened up. He is not a diabetic. He does not appear to have arterial issues 3. Possibility of underlying osteomyelitis will need to be considered we will do a plain x-ray of the foot although he may require an MRI. 4. It would be possible to do a bone specimen for culture and pathology but I did not want to do this while he was on antibiotics. 5. I have told him that if we cannot get this under control this may become a surgical problemo Ray amputation 6. Silver alginate change daily his primary dressing. We have given him a forefoot off loader. Pressure relief will be paramount Electronic Signature(s) Signed: 06/07/2019 4:15:01 PM By: Linton Ham MD Entered By: Linton Ham on 06/07/2019 15:17:51 Christopher Mendez ([id number]) -------------------------------------------------------------------------------- ROS/PFSH Details Patient Name: Christopher Mendez, Christopher Mendez. Date of Service: 06/07/2019 2:15 PM Medical Record Number: [id number] Patient Account Number: [id number] Date of Birth/Sex: 1951/05/20 (69 y.o. M) Treating RN: Army Melia Primary Care Provider: Beverlyn Roux Other Clinician: Referring Provider: Delight Stare Treating Provider/Extender: Tito Dine in Treatment: 0 Information Obtained From Patient Constitutional Symptoms (General Health) Complaints and Symptoms: Negative for: Fatigue; Fever; Chills; Marked Weight Change Eyes Complaints and Symptoms: Negative  for: Dry Eyes; Vision Changes; Glasses / Contacts Medical History: Positive for: Cataracts - removed Negative for: Glaucoma; Optic Neuritis Ear/Nose/Mouth/Throat Complaints and Symptoms: Negative for: Difficult clearing ears;  Sinusitis Hematologic/Lymphatic Complaints and Symptoms: Negative for: Bleeding / Clotting Disorders; Human Immunodeficiency Virus Medical History: Negative for: Anemia; Hemophilia; Human Immunodeficiency Virus; Lymphedema; Sickle Cell Disease Respiratory Complaints and Symptoms: Negative for: Chronic or frequent coughs; Shortness of Breath Medical History: Positive for: Asthma; Chronic Obstructive Pulmonary Disease (COPD); Sleep Apnea Negative for: Aspiration; Pneumothorax; Tuberculosis Cardiovascular Complaints and Symptoms: Negative for: Chest pain; LE edema Medical History: Positive for: Hypertension Negative for: Angina; Arrhythmia; Congestive Heart Failure; Coronary Artery Disease; Deep Vein Thrombosis; Hypotension; Myocardial Infarction; Peripheral Arterial Disease; Peripheral Venous Disease; Phlebitis; Vasculitis Gastrointestinal Christopher Mendez, Christopher Mendez. ([id number]) Complaints and Symptoms: Negative for: Frequent diarrhea; Nausea; Vomiting Medical History: Negative for: Cirrhosis ; Colitis; Crohnos; Hepatitis A; Hepatitis B; Hepatitis C Endocrine Complaints and Symptoms: Negative for: Hepatitis; Thyroid disease; Polydypsia (Excessive Thirst) Medical History: Negative for: Type I Diabetes; Type II Diabetes Genitourinary Complaints and Symptoms: Negative for: Kidney failure/ Dialysis; Incontinence/dribbling Medical History: Negative for: End Stage Renal Disease Immunological Complaints and Symptoms: Negative for: Hives; Itching Medical History: Negative for: Lupus Erythematosus; Raynaudos; Scleroderma Integumentary (Skin) Complaints and Symptoms: Positive for: Wounds Negative for: Bleeding or bruising tendency; Breakdown; Swelling Medical History: Negative for: History of Burn; History of pressure wounds Musculoskeletal Complaints and Symptoms: Negative for: Muscle Pain; Muscle Weakness Medical History: Negative for: Gout; Rheumatoid Arthritis; Osteoarthritis;  Osteomyelitis Neurologic Complaints and Symptoms: Negative for: Numbness/parasthesias; Focal/Weakness Medical History: Negative for: Dementia; Neuropathy; Quadriplegia; Paraplegia; Seizure Disorder Psychiatric Complaints and Symptoms: Negative for: Anxiety; Christopher Mendez, Christopher Mendez ([id number]) Medical History: Negative for: Anorexia/bulimia; Confinement Anxiety Oncologic Complaints and Symptoms: Review of System Notes: Prostate cancer Medical History: Positive for: Received Radiation Negative for: Received Chemotherapy HBO Extended History Items Eyes: Cataracts Immunizations Pneumococcal Vaccine: Received Pneumococcal Vaccination: No Implantable Devices None Family and Social History Cancer: Yes - Mother; Diabetes: Yes - Father; Heart Disease: Yes - Father; Hypertension: Yes - Father; Kidney Disease: No; Lung Disease: Yes - Mother; Seizures: No; Stroke: No; Thyroid Problems: No; Tuberculosis: No; Current every day smoker - 40 years; Marital Status - Married; Alcohol Use: Rarely; Drug Use: No History; Caffeine Use: Daily; Financial Concerns: No; Food, Clothing or Shelter Needs: No; Support System Lacking: No; Transportation Concerns: No Electronic Signature(s) Signed: 06/07/2019 3:13:50 PM By: Army Melia Signed: 06/07/2019 4:15:01 PM By: Linton Ham MD Entered By: Army Melia on 06/07/2019 14:34:12 Christopher Mendez ([id number]) -------------------------------------------------------------------------------- SuperBill Details Patient Name: LUDOVIC, WITTMAN. Date of Service: 06/07/2019 Medical Record Number: [id number] Patient Account Number: [id number] Date of Birth/Sex: January 13, 1951 (69 y.o. M) Treating RN: Cornell Barman Primary Care Provider: Beverlyn Roux Other Clinician: Referring Provider: Delight Stare Treating Provider/Extender: Tito Dine in Treatment: 0 Diagnosis Coding ICD-10 Codes Code Description L97.514 Non-pressure chronic ulcer of  other part of right foot with necrosis of bone L03.115 Cellulitis of right lower limb Facility Procedures CPT4 Code: [id number] Description: 99214 - WOUND CARE VISIT-LEV 4 EST PT Modifier: Quantity: 1 Physician Procedures CPT4 Code: [id number] Description: [id number] - WC PHYS LEVEL 4 - NEW PT ICD-10 Diagnosis Description L97.514 Non-pressure chronic ulcer of other part of right foot with n L03.115 Cellulitis of right lower limb Modifier: ecrosis of bone Quantity: 1 Electronic Signature(s) Signed: 06/07/2019 4:58:04 PM By: Gretta Cool, BSN, RN, CWS, Kim RN, BSN Previous Signature: 06/07/2019 4:15:01 PM Version By: Linton Ham MD Entered By: Gretta Cool, BSN, RN, CWS,  Kim on 06/07/2019 16:58:03

## 2019-06-07 NOTE — Progress Notes (Signed)
Christopher Mendez, Christopher Mendez ([id number]) Visit Report for 06/07/2019 Allergy List Details Patient Name: Christopher Mendez, Christopher Mendez. Date of Service: 06/07/2019 2:15 PM Medical Record Number: [id number] Patient Account Number: [id number] Date of Birth/Sex: [birth year] (69 y.o. M) Treating RN: Army Melia Primary Care Jahmya Onofrio: Beverlyn Roux Other Clinician: Referring Ankita Newcomer: Delight Stare Treating Jhony Antrim/Extender: Ricard Dillon Weeks in Treatment: 0 Allergies Active Allergies No Known Allergies Allergy Notes Electronic Signature(s) Signed: 06/07/2019 3:13:50 PM By: Army Melia Entered By: Army Melia on 06/07/2019 14:29:18 Christopher Mendez ([id number]) -------------------------------------------------------------------------------- Arrival Information Details Patient Name: Christopher Mendez, Christopher Mendez. Date of Service: 06/07/2019 2:15 PM Medical Record Number: [id number] Patient Account Number: [id number] Date of Birth/Sex: January 05, 1951 (69 y.o. M) Treating RN: Cornell Barman Primary Care Imanni Burdine: Beverlyn Roux Other Clinician: Referring Franchon Ketterman: Delight Stare Treating Caitlinn Klinker/Extender: Tito Dine in Treatment: 0 Visit Information Patient Arrived: Ambulatory Arrival Time: 14:11 Accompanied By: wife Transfer Assistance: None Patient Identification Verified: Yes Secondary Verification Process Completed: Yes Electronic Signature(s) Signed: 06/07/2019 4:11:36 PM By: Lorine Bears RCP, RRT, CHT Entered By: Lorine Bears on 06/07/2019 14:11:52 Christopher Mendez ([id number]) -------------------------------------------------------------------------------- Clinic Level of Care Assessment Details Patient Name: Christopher Mendez, Christopher Mendez. Date of Service: 06/07/2019 2:15 PM Medical Record Number: [id number] Patient Account Number: [id number] Date of Birth/Sex: 1951-04-17 (69 y.o. M) Treating RN: Cornell Barman Primary Care Makaylia Hewett: Beverlyn Roux Other Clinician: Referring Peyton Spengler:  Delight Stare Treating Andersen Iorio/Extender: Tito Dine in Treatment: 0 Clinic Level of Care Assessment Items TOOL 2 Quantity Score []  - Use when only an EandM is performed on the INITIAL visit 0 ASSESSMENTS - Nursing Assessment / Reassessment X - General Physical Exam (combine w/ comprehensive assessment (listed just below) when 1 20 performed on new pt. evals) X- 1 25 Comprehensive Assessment (HX, ROS, Risk Assessments, Wounds Hx, etc.) ASSESSMENTS - Wound and Skin Assessment / Reassessment X - Simple Wound Assessment / Reassessment - one wound 1 5 []  - 0 Complex Wound Assessment / Reassessment - multiple wounds []  - 0 Dermatologic / Skin Assessment (not related to wound area) ASSESSMENTS - Ostomy and/or Continence Assessment and Care []  - Incontinence Assessment and Management 0 []  - 0 Ostomy Care Assessment and Management (repouching, etc.) PROCESS - Coordination of Care X - Simple Patient / Family Education for ongoing care 1 15 []  - 0 Complex (extensive) Patient / Family Education for ongoing care X- 1 10 Staff obtains Programmer, systems, Records, Test Results / Process Orders []  - 0 Staff telephones HHA, Nursing Homes / Clarify orders / etc []  - 0 Routine Transfer to another Facility (non-emergent condition) []  - 0 Routine Hospital Admission (non-emergent condition) X- 1 15 New Admissions / Biomedical engineer / Ordering NPWT, Apligraf, etc. []  - 0 Emergency Hospital Admission (emergent condition) X- 1 10 Simple Discharge Coordination []  - 0 Complex (extensive) Discharge Coordination PROCESS - Special Needs []  - Pediatric / Minor Patient Management 0 []  - 0 Isolation Patient Management Christopher Mendez, Christopher Mendez ([id number]) []  - 0 Hearing / Language / Visual special needs []  - 0 Assessment of Community assistance (transportation, D/C planning, etc.) []  - 0 Additional assistance / Altered mentation []  - 0 Support Surface(s) Assessment (bed, cushion, seat,  etc.) INTERVENTIONS - Wound Cleansing / Measurement X - Wound Imaging (photographs - any number of wounds) 1 5 []  - 0 Wound Tracing (instead of photographs) X- 1 5 Simple Wound Measurement - one wound []  - 0 Complex Wound Measurement - multiple wounds X- 1 5 Simple Wound Cleansing - one wound []  -  0 Complex Wound Cleansing - multiple wounds INTERVENTIONS - Wound Dressings []  - Small Wound Dressing one or multiple wounds 0 X- 1 15 Medium Wound Dressing one or multiple wounds []  - 0 Large Wound Dressing one or multiple wounds []  - 0 Application of Medications - injection INTERVENTIONS - Miscellaneous []  - External ear exam 0 X- 1 5 Specimen Collection (cultures, biopsies, blood, body fluids, etc.) X- 1 5 Specimen(s) / Culture(s) sent or taken to Lab for analysis []  - 0 Patient Transfer (multiple staff / Civil Service fast streamer / Similar devices) []  - 0 Simple Staple / Suture removal (25 or less) []  - 0 Complex Staple / Suture removal (26 or more) []  - 0 Hypo / Hyperglycemic Management (close monitor of Blood Glucose) X- 1 15 Ankle / Brachial Index (ABI) - do not check if billed separately Has the patient been seen at the hospital within the last three years: Yes Total Score: 155 Level Of Care: New/Established - Level 4 Electronic Signature(s) Signed: 06/07/2019 4:58:36 PM By: Gretta Cool, BSN, RN, CWS, Kim RN, BSN Entered By: Gretta Cool, BSN, RN, CWS, Kim on 06/07/2019 16:57:19 Christopher Mendez ([id number]) -------------------------------------------------------------------------------- Lower Extremity Assessment Details Patient Name: Christopher Mendez, Christopher Mendez. Date of Service: 06/07/2019 2:15 PM Medical Record Number: [id number] Patient Account Number: [id number] Date of Birth/Sex: 08-26-1950 (69 y.o. M) Treating RN: Army Melia Primary Care Aamir Mclinden: Beverlyn Roux Other Clinician: Referring Barry Culverhouse: Delight Stare Treating Derrika Ruffalo/Extender: Tito Dine in Treatment: 0 Edema  Assessment Assessed: [Left: No] [Right: No] Edema: [Left: No] [Right: Yes] Calf Left: Right: Point of Measurement: 35 cm From Medial Instep 37 cm 37.5 cm Ankle Left: Right: Point of Measurement: 12 cm From Medial Instep 21 cm 24 cm Vascular Assessment Pulses: Dorsalis Pedis Palpable: [Left:Yes] [Right:Yes] Doppler Audible: [Right:Yes] Posterior Tibial Palpable: [Right:Yes] Doppler Audible: [Right:Yes] Blood Pressure: Brachial: [Right:128] Dorsalis Pedis: 132 Ankle: Posterior Tibial: 120 Ankle Brachial Index: [Right:1.03] Electronic Signature(s) Signed: 06/07/2019 3:13:50 PM By: Army Melia Entered By: Army Melia on 06/07/2019 14:28:52 Christopher Mendez ([id number]) -------------------------------------------------------------------------------- Multi Wound Chart Details Patient Name: Christopher Mendez. Date of Service: 06/07/2019 2:15 PM Medical Record Number: [id number] Patient Account Number: [id number] Date of Birth/Sex: [birth year] (69 y.o. M) Treating RN: Cornell Barman Primary Care Harkirat Orozco: Beverlyn Roux Other Clinician: Referring Lashay Osborne: Delight Stare Treating Kallan Merrick/Extender: Tito Dine in Treatment: 0 Vital Signs Height(in): 73 Pulse(bpm): 72 Weight(lbs): 238 Blood Pressure(mmHg): 141/58 Body Mass Index(BMI): 31 Temperature(F): 98.4 Respiratory Rate 16 (breaths/min): Photos: [N/A:N/A] Wound Location: Right Foot - Plantar, Lateral N/A N/A Wounding Event: Blister N/A N/A Primary Etiology: Pressure Ulcer N/A N/A Comorbid History: Cataracts, Asthma, Chronic N/A N/A Obstructive Pulmonary Disease (COPD), Sleep Apnea, Hypertension, Received Radiation Date Acquired: 05/26/2019 N/A N/A Weeks of Treatment: 0 N/A N/A Wound Status: Open N/A N/A Pending Amputation on Yes N/A N/A Presentation: Measurements L x W x D 1.8x2x0.6 N/A N/A (cm) Area (cm) : 2.827 N/A N/A Volume (cm) : 1.696 N/A N/A % Reduction in Area: 0.00% N/A N/A % Reduction in  Volume: 0.00% N/A N/A Starting Position 1 1 (o'clock): Ending Position 1 6 (o'clock): Maximum Distance 1 (cm): 0.8 Undermining: Yes N/A N/A Classification: Category/Stage IV N/A N/A Exudate Amount: Medium N/A N/A Exudate Type: Serosanguineous N/A N/A Exudate Color: red, brown N/A N/A Christopher Mendez, RUMMEL. ([id number]) Wound Margin: Flat and Intact N/A N/A Granulation Amount: Small (1-33%) N/A N/A Granulation Quality: Pink N/A N/A Necrotic Amount: Large (67-100%) N/A N/A Necrotic Tissue: Eschar, Adherent Slough N/A N/A Exposed Structures: Fat Layer (  Subcutaneous N/A N/A Tissue) Exposed: Yes Bone: Yes Fascia: No Tendon: No Muscle: No Joint: No Treatment Notes Electronic Signature(s) Signed: 06/07/2019 4:15:01 PM By: Linton Ham MD Entered By: Linton Ham on 06/07/2019 15:02:06 Christopher Mendez ([id number]) -------------------------------------------------------------------------------- [place] Details Patient Name: Christopher Mendez, SHAMI. Date of Service: 06/07/2019 2:15 PM Medical Record Number: [id number] Patient Account Number: [id number] Date of Birth/Sex: 02-Jan-1951 (69 y.o. M) Treating RN: Cornell Barman Primary Care Kavir Savoca: Beverlyn Roux Other Clinician: Referring Rayansh Herbst: Delight Stare Treating Rodrigo Mcgranahan/Extender: Tito Dine in Treatment: 0 Active Inactive Necrotic Tissue Nursing Diagnoses: Impaired tissue integrity related to necrotic/devitalized tissue Goals: Necrotic/devitalized tissue will be minimized in the wound bed Date Initiated: 06/07/2019 Target Resolution Date: 06/28/2019 Goal Status: Active Interventions: Assess patient pain level pre-, during and post procedure and prior to discharge Treatment Activities: Apply topical anesthetic as ordered : 06/07/2019 Notes: Orientation to the Wound Care Program Nursing Diagnoses: Knowledge deficit related to the wound healing center program Goals: Patient/caregiver will verbalize  understanding of the [place] Date Initiated: 06/07/2019 Target Resolution Date: 06/28/2019 Goal Status: Active Interventions: Provide education on orientation to the wound center Notes: Pressure Nursing Diagnoses: Knowledge deficit related to causes and risk factors for pressure ulcer development Goals: Patient will remain free of pressure ulcers Date Initiated: 06/07/2019 Target Resolution Date: 06/28/2019 Goal Status: Active Christopher Mendez, Christopher Mendez ([id number]) Interventions: Assess: immobility, friction, shearing, incontinence upon admission and as needed Notes: Soft Tissue Infection Nursing Diagnoses: Potential for infection: soft tissue Goals: Patient's soft tissue infection will resolve Date Initiated: 06/07/2019 Target Resolution Date: 06/28/2019 Goal Status: Active Interventions: Assess signs and symptoms of infection every visit Treatment Activities: Culture and sensitivity : 06/07/2019 Systemic antibiotics : 06/07/2019 Notes: Wound/Skin Impairment Nursing Diagnoses: Impaired tissue integrity Goals: Ulcer/skin breakdown will have a volume reduction of 30% by week 4 Date Initiated: 06/07/2019 Target Resolution Date: 07/08/2019 Goal Status: Active Interventions: Assess ulceration(s) every visit Treatment Activities: Skin care regimen initiated : 06/07/2019 Topical wound management initiated : 06/07/2019 Notes: Electronic Signature(s) Signed: 06/07/2019 4:52:26 PM By: Gretta Cool, BSN, RN, CWS, Kim RN, BSN Entered By: Gretta Cool, BSN, RN, CWS, Kim on 06/07/2019 14:53:55 Christopher Mendez ([id number]) -------------------------------------------------------------------------------- Pain Assessment Details Patient Name: Christopher Mendez, CARDE. Date of Service: 06/07/2019 2:15 PM Medical Record Number: [id number] Patient Account Number: [id number] Date of Birth/Sex: [birth year] (69 y.o. M) Treating RN: Cornell Barman Primary Care Channing Yeager: Beverlyn Roux Other Clinician: Referring  Anil Havard: Delight Stare Treating Nikko Goldwire/Extender: Tito Dine in Treatment: 0 Active Problems Location of Pain Severity and Description of Pain Patient Has Paino No Site Locations Pain Management and Medication Current Pain Management: Electronic Signature(s) Signed: 06/07/2019 4:11:36 PM By: Lorine Bears RCP, RRT, CHT Signed: 06/07/2019 4:52:26 PM By: Gretta Cool, BSN, RN, CWS, Kim RN, BSN Entered By: Lorine Bears on 06/07/2019 14:12:25 Christopher Mendez ([id number]) -------------------------------------------------------------------------------- Wound Assessment Details Patient Name: Christopher Mendez, GRIZZEL. Date of Service: 06/07/2019 2:15 PM Medical Record Number: [id number] Patient Account Number: [id number] Date of Birth/Sex: [birth year] (69 y.o. M) Treating RN: Army Melia Primary Care Sanad Fearnow: Beverlyn Roux Other Clinician: Referring Kaynen Minner: Christopher Mendez, Christopher Mendez Treating Obie Kallenbach/Extender: Tito Dine in Treatment: 0 Wound Status Wound Number: 1 Primary Pressure Ulcer Etiology: Wound Location: Right Foot - Plantar, Lateral Wound Open Wounding Event: Blister Status: Date Acquired: 05/26/2019 Comorbid Cataracts, Asthma, Chronic Obstructive Weeks Of Treatment: 0 History: Pulmonary Disease (COPD), Sleep Apnea, Clustered Wound: No Hypertension, Received Radiation Pending Amputation On Presentation Photos Wound Measurements Length: (cm) 1.8 %  Reductio Width: (cm) 2 % Reductio Depth: (cm) 0.6 Tunneling: Area: (cm) 2.827 Undermini Volume: (cm) 1.696 Starti Ending Maximum n in Area: 0% n in Volume: 0% No ng: Yes ng Position (o'clock): 1 Position (o'clock): 6 Distance: (cm) 0.8 Wound Description Classification: Category/Stage IV Foul Odor Wound Margin: Flat and Intact Slough/Fib Exudate Amount: Medium Exudate Type: Serosanguineous Exudate Color: red, brown After Cleansing: No rino Yes Wound Bed Granulation Amount: Small  (1-33%) Exposed Structure Granulation Quality: Pink Fascia Exposed: No Necrotic Amount: Large (67-100%) Fat Layer (Subcutaneous Tissue) Exposed: Yes Necrotic Quality: Eschar, Adherent Slough Tendon Exposed: No Muscle Exposed: No Joint Exposed: No JAKEB, CAMEJO ([id number]) Bone Exposed: Yes Electronic Signature(s) Signed: 06/07/2019 3:13:50 PM By: Army Melia Signed: 06/07/2019 4:52:26 PM By: Gretta Cool, BSN, RN, CWS, Kim RN, BSN Entered By: Gretta Cool, BSN, RN, CWS, Kim on 06/07/2019 14:55:09 Christopher Mendez ([id number]) -------------------------------------------------------------------------------- Vitals Details Patient Name: Christopher Mendez. Date of Service: 06/07/2019 2:15 PM Medical Record Number: [id number] Patient Account Number: [id number] Date of Birth/Sex: [birth year] (69 y.o. M) Treating RN: Cornell Barman Primary Care Ryott Rafferty: Beverlyn Roux Other Clinician: Referring Marieli Rudy: Delight Stare Treating Lerlene Treadwell/Extender: Tito Dine in Treatment: 0 Vital Signs Time Taken: 14:12 Temperature (F): 98.4 Height (in): 73 Pulse (bpm): 72 Source: Stated Respiratory Rate (breaths/min): 16 Weight (lbs): 238 Blood Pressure (mmHg): 141/58 Source: Measured Reference Range: 80 - 120 mg / dl Body Mass Index (BMI): 31.4 Electronic Signature(s) Signed: 06/07/2019 4:11:36 PM By: Lorine Bears RCP, RRT, CHT Entered By: Lorine Bears on 06/07/2019 14:14:56

## 2019-06-10 LAB — AEROBIC CULTURE W GRAM STAIN (SUPERFICIAL SPECIMEN)

## 2019-06-14 ENCOUNTER — Other Ambulatory Visit: Payer: Self-pay

## 2019-06-14 ENCOUNTER — Encounter: Payer: Medicare Other | Admitting: Internal Medicine

## 2019-06-14 DIAGNOSIS — L97514 Non-pressure chronic ulcer of other part of right foot with necrosis of bone: Secondary | ICD-10-CM | POA: Diagnosis not present

## 2019-06-21 ENCOUNTER — Encounter: Payer: Medicare Other | Admitting: Internal Medicine

## 2019-06-21 ENCOUNTER — Other Ambulatory Visit: Payer: Self-pay

## 2019-06-21 DIAGNOSIS — L97514 Non-pressure chronic ulcer of other part of right foot with necrosis of bone: Secondary | ICD-10-CM | POA: Diagnosis not present

## 2019-06-21 NOTE — Progress Notes (Signed)
Christopher Mendez, Christopher Mendez ([id number]) Visit Report for 06/14/2019 Debridement Details Patient Name: Christopher Mendez, Christopher Mendez. Date of Service: 06/14/2019 1:45 PM Medical Record Number: [id number] Patient Account Number: [id number] Date of Birth/Sex: 10/20/1950 (69 y.o. M) Treating RN: Cornell Barman Primary Care Provider: Beverlyn Roux Other Clinician: Referring Provider: Beverlyn Roux Treating Provider/Extender: Tito Dine in Treatment: 1 Debridement Performed for Wound #1 Right,Lateral,Plantar Foot Assessment: Performed By: Physician Ricard Dillon, MD Debridement Type: Debridement Level of Consciousness (Pre- Awake and Alert procedure): Pre-procedure Verification/Time Yes - 13:57 Out Taken: Start Time: 13:57 Pain Control: Lidocaine Total Area Debrided (L x W): 1.5 (cm) x 2 (cm) = 3 (cm) Tissue and other material Viable, Non-Viable, Subcutaneous debrided: Level: Skin/Subcutaneous Tissue Debridement Description: Excisional Instrument: Curette Bleeding: Moderate Hemostasis Achieved: Pressure End Time: 13:59 Response to Treatment: Procedure was tolerated well Level of Consciousness Awake and Alert (Post-procedure): Post Debridement Measurements of Total Wound Length: (cm) 1.5 Stage: Category/Stage IV Width: (cm) 2 Depth: (cm) 0.9 Volume: (cm) 2.121 Character of Wound/Ulcer Post Stable Debridement: Post Procedure Diagnosis Same as Pre-procedure Electronic Signature(s) Signed: 06/14/2019 3:29:52 PM By: Gretta Cool, BSN, RN, CWS, Kim RN, BSN Signed: 06/21/2019 4:18:13 PM By: Linton Ham MD Entered By: Linton Ham on 06/14/2019 14:09:56 Christopher Mendez ([id number]) -------------------------------------------------------------------------------- HPI Details Patient Name: Christopher Mendez, Christopher Mendez. Date of Service: 06/14/2019 1:45 PM Medical Record Number: [id number] Patient Account Number: [id number] Date of Birth/Sex: 04-Jan-1951 (69 y.o. M) Treating RN: Cornell Barman Primary  Care Provider: Beverlyn Roux Other Clinician: Referring Provider: Beverlyn Roux Treating Provider/Extender: Tito Dine in Treatment: 1 History of Present Illness HPI Description: ADMISSION 06/07/2019 This is a 69 year old nondiabetic man. He is here accompanied by his wife. He tells me that he had a callus on the right lateral foot over roughly the fifth metatarsal head plantar aspect which he never really bothered about because it did not really bother him. Apparently 2 weeks ago the callus split open and fell off leaving him with a very deep sore with considerable depth. He is already been seen and by his primary physician in the [facility]. Apparently a culture was done and he was prescribed Levaquin for 10 days. We do not have the culture result. Past medical history; prostate CA for which she received radiation and now androgen depletion therapy, COPD, cardiac enlargement. He is a heavy smoker. ABIs in our clinic were 1.03 1/20; this is a nondiabetic man who came in with a deep probing wound over the right lateral foot over the fifth metatarsal head. He says he had a large callus in this area that eventually split off but he was left with a deep wound in approximately 50% of an otherwise dime shaped wound. This went all the way down to periosteum. He was given Levaquin by his primary doctor. Electronic Signature(s) Signed: 06/21/2019 4:18:13 PM By: Linton Ham MD Entered By: Linton Ham on 06/14/2019 14:12:09 Christopher Mendez ([id number]) -------------------------------------------------------------------------------- Physical Exam Details Patient Name: Christopher Mendez, Christopher Mendez. Date of Service: 06/14/2019 1:45 PM Medical Record Number: [id number] Patient Account Number: [id number] Date of Birth/Sex: 01-Oct-1950 (69 y.o. M) Treating RN: Cornell Barman Primary Care Provider: Beverlyn Roux Other Clinician: Referring Provider: Beverlyn Roux Treating Provider/Extender: Tito Dine in Treatment: 1 Constitutional Patient is hypertensive.. Pulse regular and within target range for patient.Marland Kitchen Respirations regular, non-labored and within target range.. Temperature is normal and within the target range for the patient.Marland Kitchen appears in no distress. Notes Wound exam; the areas over the plantar left  fifth metatarsal head. This still has exposed periosteum. No erythema or crepitus around the wound no purulent drainage. Using #3 curette I debrided the surface of the wound and the walls of this probing wound is well to hopefully healthy granulation tissue. Bleeding was controlled with a pressure dressing. No additional cultures were done as of the looking for the bathroom they Gosai take area self now debrided subcutaneous debris on the walls and the base of this wound which I think is periosteum. Electronic Signature(s) Signed: 06/21/2019 4:18:13 PM By: Linton Ham MD Entered By: Linton Ham on 06/14/2019 14:16:20 Christopher Mendez ([id number]) -------------------------------------------------------------------------------- Physician Orders Details Patient Name: Christopher Mendez, Christopher Mendez. Date of Service: 06/14/2019 1:45 PM Medical Record Number: [id number] Patient Account Number: [id number] Date of Birth/Sex: 1950/07/15 (69 y.o. M) Treating RN: Cornell Barman Primary Care Provider: Beverlyn Roux Other Clinician: Referring Provider: Beverlyn Roux Treating Provider/Extender: Tito Dine in Treatment: 1 Verbal / Phone Orders: No Diagnosis Coding Wound Cleansing o Clean wound with Normal Saline. o May Shower, gently pat wound dry prior to applying new dressing. Anesthetic (add to Medication List) Wound #1 Right,Lateral,Plantar Foot o Topical Lidocaine 4% cream applied to wound bed prior to debridement (In Clinic Only). Primary Wound Dressing Wound #1 Right,Lateral,Plantar Foot o Silver Collagen Secondary Dressing Wound #1 Right,Lateral,Plantar  Foot o ABD and Kerlix/Conform Dressing Change Frequency Wound #1 Right,Lateral,Plantar Foot o Change dressing every other day. Follow-up Appointments Wound #1 Right,Lateral,Plantar Foot o Return Appointment in 1 week. Edema Control Wound #1 Right,Lateral,Plantar Foot o Elevate legs to the level of the heart and pump ankles as often as possible Off-Loading Wound #1 Right,Lateral,Plantar Foot o Other: [id number] loader Additional Orders / Instructions Wound #1 Right,Lateral,Plantar Foot o Stop Smoking o Increase protein intake. o Other: - Stay off of feet as much as possible. Elevate when sitting. Electronic Signature(s) Signed: 06/14/2019 3:29:52 PM By: Gretta Cool, BSN, RN, CWS, Kim RN, BSN [address] [place] ([id number]) Signed: 06/21/2019 4:18:13 PM By: Linton Ham MD Entered By: Gretta Cool, BSN, RN, CWS, Kim on 06/14/2019 14:00:02 Christopher Mendez, Christopher Mendez ([id number]) -------------------------------------------------------------------------------- Problem List Details Patient Name: Christopher Mendez, Christopher Mendez. Date of Service: 06/14/2019 1:45 PM Medical Record Number: [id number] Patient Account Number: [id number] Date of Birth/Sex: 1951-02-07 (69 y.o. 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 Date of Service: 06/14/2019 1:45 PM Medical Record Number: [id number] Patient Account Number: [id number] Date of Birth/Sex: 10-Nov-1950 (69 y.o. M) Treating RN: Cornell Barman Primary Care Provider: Beverlyn Roux Other Clinician: Referring Provider: Beverlyn Roux Treating Provider/Extender: Tito Dine in Treatment: 1 Subjective History of Present Illness (HPI) ADMISSION 06/07/2019 This is a 69 year old nondiabetic man. He is here accompanied by his wife. He tells me that he had a callus on the right lateral foot over roughly the fifth metatarsal head plantar aspect which he never really bothered about because it did not really bother him. Apparently 2 weeks ago the callus split open and fell off leaving him with a very deep  sore with considerable depth. He is already been seen and by his primary physician in the [facility]. Apparently a culture was done and he was prescribed Levaquin for 10 days. We do not have the culture result. Past medical history; prostate CA for which she received radiation and now androgen depletion therapy, COPD, cardiac enlargement. He is a heavy smoker. ABIs in our clinic were 1.03 1/20; this is a nondiabetic man who came in with a deep probing wound over the right lateral foot over the fifth metatarsal head. He says he had a large callus in this area that eventually split off but he was left with a deep wound in approximately 50% of an otherwise dime shaped wound. This went all the way down to periosteum. He was given Levaquin by his primary doctor. Objective Constitutional Patient is hypertensive.. Pulse regular and within target range for patient.Marland Kitchen Respirations regular, non-labored and within target range.. Temperature is normal and within the target range for the patient.Marland Kitchen appears in no distress. Vitals Time Taken: 1:45 PM, Height: 73 in, Weight: 238 lbs, BMI: 31.4, Temperature: 98.4 F, Pulse: 84 bpm, Respiratory Rate: 16  breaths/min, Blood Pressure: 146/61 mmHg. General Notes: Wound exam; the areas over the plantar left fifth metatarsal head. This still has exposed periosteum. No erythema or crepitus around the wound no purulent drainage. Using #3 curette I debrided the surface of the wound and the walls of this probing wound is well to hopefully healthy granulation tissue. Bleeding was controlled with a pressure dressing. No additional cultures were done as of the looking for the bathroom they Gosai take area self now debrided subcutaneous debris on the walls and the base of this wound which I think is periosteum. Integumentary (Hair, Skin) Wound #1 status is Open. Original cause of wound was Blister. The wound is located on the [place]. The Christopher Mendez, Christopher Mendez. ([id number]) wound measures 1.5cm length x 2cm width x 0.9cm depth; 2.356cm^2 area and 2.121cm^3 volume. There is bone, tendon, and Fat Layer (Subcutaneous Tissue) Exposed exposed. There is undermining starting at 7:00 and ending at 9:00 with a maximum distance of 0.6cm. There is a medium amount of serosanguineous drainage noted. The wound margin is flat and intact. There is small (1-33%) pink granulation within the wound bed. There is a large (67-100%) amount of necrotic tissue within the wound bed including Eschar and Adherent Slough. Assessment Active Problems ICD-10 Non-pressure chronic ulcer of other part of right foot with necrosis of bone Cellulitis of right lower limb Procedures Wound #1 Pre-procedure diagnosis of Wound #1 is a Pressure Ulcer located on the Right,Lateral,Plantar Foot . There was a Excisional Skin/Subcutaneous Tissue Debridement with a total area of 3 sq cm performed by Ricard Dillon, MD. With the following instrument(s): Curette to remove Viable and Non-Viable tissue/material. Material removed includes Subcutaneous Tissue after achieving pain control using Lidocaine. No specimens were taken. A time out was  conducted at 13:57, prior to the start of the procedure. A Moderate amount of bleeding was controlled with Pressure. The procedure was tolerated well. Post Debridement Measurements: 1.5cm length x 2cm width x 0.9cm depth; 2.121cm^3 volume. Post debridement Stage noted as Category/Stage IV. Character of Wound/Ulcer Post Debridement is stable. Post procedure Diagnosis Wound #1: Same as Pre-Procedure Plan Wound Cleansing: Clean wound with Normal Saline. May Shower, gently pat wound dry prior to applying new dressing. Anesthetic (add to Medication List): Wound #1 Right,Lateral,Plantar Foot: Topical Lidocaine 4% cream applied to wound bed prior to debridement (In Clinic  Only). Primary Wound Dressing: Wound #1 Right,Lateral,Plantar Foot: Silver Collagen Secondary Dressing: Wound #1 Right,Lateral,Plantar Foot: ABD and Kerlix/Conform Dressing Change Frequency: Wound #1 Right,Lateral,Plantar Foot: Change dressing every other day. Follow-up Appointments: Christopher Mendez, Christopher Mendez ([id number]) Wound #1 Right,Lateral,Plantar Foot: Return Appointment in 1 week. Edema Control: Wound #1 Right,Lateral,Plantar Foot: Elevate legs to the level of the heart and pump ankles as often as possible Off-Loading: Wound #1 Right,Lateral,Plantar Foot: Other: [id number] loader Additional Orders / Instructions: Wound #1 Right,Lateral,Plantar Foot: Stop Smoking Increase protein intake. Other: - Stay off of feet as much as possible. Elevate when sitting. 1. The patient has completed Levaquin. We do not have the original culture result that I can see 2. I change the primary dressing to silver collagen 3. Culture we did last week [on Levaquin] and x-ray were both negative. 4. I am going to give this a couple of weeks to see if we can get some decent granulation tissue over the deep structures. If not he may require an MRI of the foot. 5. We are offloading in a forefoot offloading boot he claims to be compliant.  Total contact cast at some point not out of the question Electronic Signature(s) Signed: 06/21/2019 4:18:13 PM By: Linton Ham MD Entered By: Linton Ham on 06/14/2019 14:17:36 Christopher Mendez ([id number]) -------------------------------------------------------------------------------- [place] Details Patient Name: Christopher Mendez. Date of Service: 06/14/2019 Medical Record Number: [id number] Patient Account Number: [id number] Date of Birth/Sex: [birth year] (69 y.o. M) Treating RN: Cornell Barman Primary Care Provider: Beverlyn Roux Other Clinician: Referring Provider: Beverlyn Roux Treating Provider/Extender: Tito Dine in Treatment: 1 Diagnosis Coding ICD-10 Codes Code Description L97.514 Non-pressure chronic ulcer of other part of right foot with necrosis of bone L03.115 Cellulitis of right lower limb Facility Procedures CPT4 Code: [id number] Description: [id number] - DEB SUBQ TISSUE 20 SQ CM/< ICD-10 Diagnosis Description L97.514 Non-pressure chronic ulcer of other part of right foot with n Modifier: ecrosis of bone Quantity: 1 Physician Procedures CPT4 Code: [id number] Description: [id number] - WC PHYS SUBQ TISS 20 SQ CM ICD-10 Diagnosis Description L97.514 Non-pressure chronic ulcer of other part of right foot with ne Modifier: crosis of bone Quantity: 1 Electronic Signature(s) Signed: 06/21/2019 4:18:13 PM By: Linton Ham MD Entered By: Linton Ham on 06/14/2019 14:17:50

## 2019-06-21 NOTE — Progress Notes (Signed)
BENNET, SPECTOR ([id number]) Visit Report for 06/14/2019 Arrival Information Details Patient Name: Christopher Mendez, Christopher Mendez. Date of Service: 06/14/2019 1:45 PM Medical Record Number: [id number] Patient Account Number: [id number] Date of Birth/Sex: [birth year] (69 y.o. M) Treating RN: Cornell Barman Primary Care Vicki Chaffin: Beverlyn Roux Other Clinician: Referring Oscar Hank: Beverlyn Roux Treating Matilyn Fehrman/Extender: Tito Dine in Treatment: 1 Visit Information History Since Last Visit Added or deleted any medications: No Patient Arrived: Ambulatory Any new allergies or adverse reactions: No Arrival Time: 13:44 Had a fall or experienced change in No Accompanied By: wife activities of daily living that may affect Transfer Assistance: None risk of falls: Secondary Verification Process Completed: Yes Signs or symptoms of abuse/neglect since last visito No Hospitalized since last visit: No Implantable device outside of the clinic excluding No cellular tissue based products placed in the center since last visit: Has Dressing in Place as Prescribed: Yes Pain Present Now: No Electronic Signature(s) Signed: 06/14/2019 4:32:44 PM By: Lorine Bears RCP, RRT, CHT Entered By: Lorine Bears on 06/14/2019 13:45:26 [name] ([id number]) -------------------------------------------------------------------------------- Encounter Discharge Information Details Patient Name: VERDUN, ACKERMANN. Date of Service: 06/14/2019 1:45 PM Medical Record Number: [id number] Patient Account Number: [id number] Date of Birth/Sex: 03/30/1951 (69 y.o. M) Treating RN: Cornell Barman Primary Care Eva Griffo: Beverlyn Roux Other Clinician: Referring Richie Vadala: Beverlyn Roux Treating Meiko Ives/Extender: Tito Dine in Treatment: 1 Encounter Discharge Information Items Post Procedure Vitals Discharge Condition: Stable Temperature (F): 98.4 Ambulatory Status: Ambulatory Pulse  (bpm): 84 Discharge Destination: Home Respiratory Rate (breaths/min): 18 Transportation: Private Auto Blood Pressure (mmHg): 146/61 Accompanied By: spouse Schedule Follow-up Appointment: Yes Clinical Summary of Care: Electronic Signature(s) Signed: 06/14/2019 3:29:52 PM By: Gretta Cool, BSN, RN, CWS, Kim RN, BSN Entered By: Gretta Cool, BSN, RN, CWS, Kim on 06/14/2019 14:01:33 [name] ([id number]) -------------------------------------------------------------------------------- Lower Extremity Assessment Details Patient Name: KAYSE, LOSEE. Date of Service: 06/14/2019 1:45 PM Medical Record Number: [id number] Patient Account Number: [id number] Date of Birth/Sex: May 08, 1951 (69 y.o. M) Treating RN: Army Melia Primary Care Andrzej Scully: Beverlyn Roux Other Clinician: Referring Senaida Chilcote: Beverlyn Roux Treating Shanik Brookshire/Extender: Ricard Dillon Weeks in Treatment: 1 Edema Assessment Assessed: [Left: No] [Right: No] Edema: [Left: N] [Right: o] Vascular Assessment Pulses: Dorsalis Pedis Palpable: [Right:Yes] Electronic Signature(s) Signed: 06/14/2019 3:27:06 PM By: Army Melia Entered By: Army Melia on 06/14/2019 13:52:53 [name] ([id number]) -------------------------------------------------------------------------------- Multi Wound Chart Details Patient Name: [name]. Date of Service: 06/14/2019 1:45 PM Medical Record Number: [id number] Patient Account Number: [id number] Date of Birth/Sex: [birth year] (69 y.o. M) Treating RN: Cornell Barman Primary Care Aryanne Gilleland: Beverlyn Roux Other Clinician: Referring Robynn Marcel: Beverlyn Roux Treating Nuvia Hileman/Extender: Tito Dine in Treatment: 1 Vital Signs Height(in): 73 Pulse(bpm): 84 Weight(lbs): 238 Blood Pressure(mmHg): 146/61 Body Mass Index(BMI): 31 Temperature(F): 98.4 Respiratory Rate 16 (breaths/min): Photos: [N/A:N/A] Wound Location: Right Foot - Plantar, Lateral N/A N/A Wounding Event: Blister  N/A N/A Primary Etiology: Pressure Ulcer N/A N/A Comorbid History: Cataracts, Asthma, Chronic N/A N/A Obstructive Pulmonary Disease (COPD), Sleep Apnea, Hypertension, Received Radiation Date Acquired: 05/26/2019 N/A N/A Weeks of Treatment: 1 N/A N/A Wound Status: Open N/A N/A Pending Amputation on Yes N/A N/A Presentation: Measurements L x W x D 1.5x2x0.9 N/A N/A (cm) Area (cm) : 2.356 N/A N/A Volume (cm) : 2.121 N/A N/A % Reduction in Area: 16.70% N/A N/A % Reduction in Volume: -25.10% N/A N/A Starting Position 1 7 (o'clock): Ending Position 1 9 (o'clock): Maximum Distance 1 (cm): 0.6 Undermining: Yes N/A N/A Classification:  Category/Stage IV N/A N/A Exudate Amount: Medium N/A N/A Exudate Type: Serosanguineous N/A N/A Exudate Color: red, brown N/A N/A TYVONE, STEINWAND. ([id number]) Wound Margin: Flat and Intact N/A N/A Granulation Amount: Small (1-33%) N/A N/A Granulation Quality: Pink N/A N/A Necrotic Amount: Large (67-100%) N/A N/A Necrotic Tissue: Eschar, Adherent Slough N/A N/A Exposed Structures: Fat Layer (Subcutaneous N/A N/A Tissue) Exposed: Yes Tendon: Yes Bone: Yes Fascia: No Muscle: No Joint: No Debridement: Debridement - Excisional N/A N/A Pre-procedure 13:57 N/A N/A Verification/Time Out Taken: Pain Control: Lidocaine N/A N/A Tissue Debrided: Subcutaneous N/A N/A Level: Skin/Subcutaneous Tissue N/A N/A Debridement Area (sq cm): 3 N/A N/A Instrument: Curette N/A N/A Bleeding: Moderate N/A N/A Hemostasis Achieved: Pressure N/A N/A Debridement Treatment Procedure was tolerated well N/A N/A Response: Post Debridement 1.5x2x0.9 N/A N/A Measurements L x W x D (cm) Post Debridement Volume: 2.121 N/A N/A (cm) Post Debridement Stage: Category/Stage IV N/A N/A Procedures Performed: Debridement N/A N/A Treatment Notes Wound #1 (Right, Lateral, Plantar Foot) Notes Prisma Ag, ABD, conform, front off-loading shoe. Electronic Signature(s) Signed:  06/21/2019 4:18:13 PM By: Linton Ham MD Entered By: Linton Ham on 06/14/2019 14:09:47 [name] ([id number]) -------------------------------------------------------------------------------- [place] Details Patient Name: DAVISON, APODACA. Date of Service: 06/14/2019 1:45 PM Medical Record Number: [id number] Patient Account Number: [id number] Date of Birth/Sex: 06/30/1950 (69 y.o. M) Treating RN: Cornell Barman Primary Care Shyam Dawson: Beverlyn Roux Other Clinician: Referring Aldyn Toon: Beverlyn Roux Treating Shamyra Farias/Extender: Tito Dine in Treatment: 1 Active Inactive Necrotic Tissue Nursing Diagnoses: Impaired tissue integrity related to necrotic/devitalized tissue Goals: Necrotic/devitalized tissue will be minimized in the wound bed Date Initiated: 06/07/2019 Target Resolution Date: 06/28/2019 Goal Status: Active Interventions: Assess patient pain level pre-, during and post procedure and prior to discharge Treatment Activities: Apply topical anesthetic as ordered : 06/07/2019 Notes: Orientation to the Wound Care Program Nursing Diagnoses: Knowledge deficit related to the wound healing center program Goals: Patient/caregiver will verbalize understanding of the [place] Date Initiated: 06/07/2019 Target Resolution Date: 06/28/2019 Goal Status: Active Interventions: Provide education on orientation to the wound center Notes: Pressure Nursing Diagnoses: Knowledge deficit related to causes and risk factors for pressure ulcer development Goals: Patient will remain free of pressure ulcers Date Initiated: 06/07/2019 Target Resolution Date: 06/28/2019 Goal Status: Active HAMSA, BRACKMAN ([id number]) Interventions: Assess: immobility, friction, shearing, incontinence upon admission and as needed Notes: Soft Tissue Infection Nursing Diagnoses: Potential for infection: soft tissue Goals: Patient's soft tissue infection  will resolve Date Initiated: 06/07/2019 Target Resolution Date: 06/28/2019 Goal Status: Active Interventions: Assess signs and symptoms of infection every visit Treatment Activities: Culture and sensitivity : 06/07/2019 Systemic antibiotics : 06/07/2019 Notes: Wound/Skin Impairment Nursing Diagnoses: Impaired tissue integrity Goals: Ulcer/skin breakdown will have a volume reduction of 30% by week 4 Date Initiated: 06/07/2019 Target Resolution Date: 07/08/2019 Goal Status: Active Interventions: Assess ulceration(s) every visit Treatment Activities: Skin care regimen initiated : 06/07/2019 Topical wound management initiated : 06/07/2019 Notes: Electronic Signature(s) Signed: 06/14/2019 1:47:25 PM By: Gretta Cool, BSN, RN, CWS, Kim RN, BSN Entered By: Gretta Cool, BSN, RN, CWS, Kim on 06/14/2019 13:47:24 [name] ([id number]) -------------------------------------------------------------------------------- Pain Assessment Details Patient Name: RILO, TRAGER. Date of Service: 06/14/2019 1:45 PM Medical Record Number: [id number] Patient Account Number: [id number] Date of Birth/Sex: June 03, 1950 (69 y.o. M) Treating RN: Cornell Barman Primary Care Praise Dolecki: Beverlyn Roux Other Clinician: Referring Joanann Mies: Beverlyn Roux Treating Jonisha Kindig/Extender: Tito Dine in Treatment: 1 Active Problems Location of Pain Severity and Description of  Pain Patient Has Paino No Site Locations Pain Management and Medication Current Pain Management: Electronic Signature(s) Signed: 06/14/2019 3:29:52 PM By: Gretta Cool, BSN, RN, CWS, Kim RN, BSN Signed: 06/14/2019 4:32:44 PM By: Lorine Bears RCP, RRT, CHT Entered By: Lorine Bears on 06/14/2019 13:45:35 [name] ([id number]) -------------------------------------------------------------------------------- Wound Assessment Details Patient Name: TRYAN, FENWICK. Date of Service: 06/14/2019 1:45 PM Medical Record  Number: [id number] Patient Account Number: [id number] Date of Birth/Sex: 1950/10/14 (69 y.o. M) Treating RN: Army Melia Primary Care Wendelyn Kiesling: Beverlyn Roux Other Clinician: Referring Shatiqua Heroux: Beverlyn Roux Treating Cenia Zaragosa/Extender: Tito Dine in Treatment: 1 Wound Status Wound Number: 1 Primary Pressure Ulcer Etiology: Wound Location: Right Foot - Plantar, Lateral Wound Open Wounding Event: Blister Status: Date Acquired: 05/26/2019 Comorbid Cataracts, Asthma, Chronic Obstructive Weeks Of Treatment: 1 History: Pulmonary Disease (COPD), Sleep Apnea, Clustered Wound: No Hypertension, Received Radiation Pending Amputation On Presentation Photos Wound Measurements Length: (cm) 1.5 Width: (cm) 2 Depth: (cm) 0.9 Area: (cm) 2.356 Volume: (cm) 2.121 % Reduction in Area: 16.7% % Reduction in Volume: -25.1% Undermining: Yes Starting Position (o'clock): 7 Ending Position (o'clock): 9 Maximum Distance: (cm) 0.6 Wound Description Classification: Category/Stage IV Wound Margin: Flat and Intact Exudate Amount: Medium Exudate Type: Serosanguineous Exudate Color: red, brown Foul Odor After Cleansing: No Slough/Fibrino Yes Wound Bed Granulation Amount: Small (1-33%) Exposed Structure Granulation Quality: Pink Fascia Exposed: No Necrotic Amount: Large (67-100%) Fat Layer (Subcutaneous Tissue) Exposed: Yes Necrotic Quality: Eschar, Adherent Slough Tendon Exposed: Yes Muscle Exposed: No Joint Exposed: No Bone Exposed: [address] DEREION, JARQUIN ([id number]) Electronic Signature(s) Signed: 06/14/2019 1:54:46 PM By: Army Melia Entered By: Army Melia on 06/14/2019 13:54:45 [name] ([id number]) -------------------------------------------------------------------------------- Vitals Details Patient Name: ANFERNEE, OLA. Date of Service: 06/14/2019 1:45 PM Medical Record Number: [id number] Patient Account Number: [id number] Date of Birth/Sex: 1950/07/25 (69  y.o. M) Treating RN: Cornell Barman Primary Care Pattricia Weiher: Beverlyn Roux Other Clinician: Referring Legrand Lasser: Beverlyn Roux Treating Arelie Kuzel/Extender: Tito Dine in Treatment: 1 Vital Signs Time Taken: 13:45 Temperature (F): 98.4 Height (in): 73 Pulse (bpm): 84 Weight (lbs): 238 Respiratory Rate (breaths/min): 16 Body Mass Index (BMI): 31.4 Blood Pressure (mmHg): 146/61 Reference Range: 80 - 120 mg / dl Electronic Signature(s) Signed: 06/14/2019 4:32:44 PM By: Lorine Bears RCP, RRT, CHT Entered By: Lorine Bears on 06/14/2019 13:46:05

## 2019-06-28 ENCOUNTER — Other Ambulatory Visit: Payer: Self-pay

## 2019-06-28 ENCOUNTER — Other Ambulatory Visit: Payer: Self-pay | Admitting: Internal Medicine

## 2019-06-28 ENCOUNTER — Encounter: Payer: Medicare Other | Attending: Internal Medicine | Admitting: Internal Medicine

## 2019-06-28 DIAGNOSIS — L03115 Cellulitis of right lower limb: Secondary | ICD-10-CM | POA: Diagnosis not present

## 2019-06-28 DIAGNOSIS — Z923 Personal history of irradiation: Secondary | ICD-10-CM | POA: Diagnosis not present

## 2019-06-28 DIAGNOSIS — M86671 Other chronic osteomyelitis, right ankle and foot: Secondary | ICD-10-CM | POA: Diagnosis not present

## 2019-06-28 DIAGNOSIS — L97514 Non-pressure chronic ulcer of other part of right foot with necrosis of bone: Secondary | ICD-10-CM

## 2019-06-28 DIAGNOSIS — Z8546 Personal history of malignant neoplasm of prostate: Secondary | ICD-10-CM | POA: Insufficient documentation

## 2019-06-28 DIAGNOSIS — J449 Chronic obstructive pulmonary disease, unspecified: Secondary | ICD-10-CM | POA: Diagnosis not present

## 2019-06-28 DIAGNOSIS — I10 Essential (primary) hypertension: Secondary | ICD-10-CM | POA: Diagnosis not present

## 2019-06-28 DIAGNOSIS — G4730 Sleep apnea, unspecified: Secondary | ICD-10-CM | POA: Insufficient documentation

## 2019-06-29 NOTE — Progress Notes (Signed)
NIGIL, BARILE ([id number]) Visit Report for 06/28/2019 Debridement Details Patient Name: Christopher Mendez, Christopher Mendez. Date of Service: 06/28/2019 12:45 PM Medical Record Number: [id number] Patient Account Number: [id number] Date of Birth/Sex: July 26, 1950 (69 y.o. M) Treating RN: Cornell Barman Primary Care Provider: Beverlyn Roux Other Clinician: Referring Provider: Beverlyn Roux Treating Provider/Extender: Tito Dine in Treatment: 3 Debridement Performed for Wound #1 Right,Lateral,Plantar Foot Assessment: Performed By: Physician Ricard Dillon, MD Debridement Type: Debridement Level of Consciousness (Pre- Awake and Alert procedure): Pre-procedure Verification/Time Yes - 12:56 Out Taken: Start Time: 12:56 Pain Control: Lidocaine Total Area Debrided (L x W): 0.9 (cm) x 1.4 (cm) = 1.26 (cm) Tissue and other material Viable, Non-Viable, Slough, Subcutaneous, Slough debrided: Level: Skin/Subcutaneous Tissue Debridement Description: Excisional Instrument: Curette Bleeding: Large Hemostasis Achieved: Silver Nitrate End Time: 12:58 Response to Treatment: Procedure was tolerated well Level of Consciousness Awake and Alert (Post-procedure): Post Debridement Measurements of Total Wound Length: (cm) 0.9 Stage: Category/Stage IV Width: (cm) 1.4 Depth: (cm) 0.8 Volume: (cm) 0.792 Character of Wound/Ulcer Post Requires Further Debridement Debridement: Post Procedure Diagnosis Same as Pre-procedure Electronic Signature(s) Signed: 06/28/2019 4:41:52 PM By: Linton Ham MD Signed: 06/29/2019 5:22:08 PM By: Gretta Cool, BSN, RN, CWS, Kim RN, BSN Entered By: Linton Ham on 06/28/2019 13:04:45 Christopher Mendez ([id number]) -------------------------------------------------------------------------------- HPI Details Patient Name: Christopher Mendez, Christopher Mendez. Date of Service: 06/28/2019 12:45 PM Medical Record Number: [id number] Patient Account Number: [id number] Date of Birth/Sex: May 24, 1951 (69  y.o. M) Treating RN: Cornell Barman Primary Care Provider: Beverlyn Roux Other Clinician: Referring Provider: Beverlyn Roux Treating Provider/Extender: Tito Dine in Treatment: 3 History of Present Illness HPI Description: ADMISSION 06/07/2019 This is a 69 year old nondiabetic man. He is here accompanied by his wife. He tells me that he had a callus on the right lateral foot over roughly the fifth metatarsal head plantar aspect which he never really bothered about because it did not really bother him. Apparently 2 weeks ago the callus split open and fell off leaving him with a very deep sore with considerable depth. He is already been seen and by his primary physician in the [facility]. Apparently a culture was done and he was prescribed Levaquin for 10 days. We do not have the culture result. Past medical history; prostate CA for which she received radiation and now androgen depletion therapy, COPD, cardiac enlargement. He is a heavy smoker. ABIs in our clinic were 1.03 1/20; this is a nondiabetic man who came in with a deep probing wound over the right lateral foot over the fifth metatarsal head. He says he had a large callus in this area that eventually split off but he was left with a deep wound in approximately 50% of an otherwise dime shaped wound. This went all the way down to periosteum. He was given Levaquin by his primary doctor. 1/27; deep probing wound over the right lateral foot over the fifth metatarsal head. In general the wound surface looks better. Still a deep probing center part of this wound. We have been using silver collagen 2/3; deep probing wound over the plantar fifth metatarsal head on the right. Exposed tendon and bone. Tissue does not look healthy. We have been using silver collagen and a forefoot offloading boot Electronic Signature(s) Signed: 06/28/2019 4:41:52 PM By: Linton Ham MD Entered By: Linton Ham on 06/28/2019 13:06:23 Christopher Mendez ([id number]) -------------------------------------------------------------------------------- Physical Exam Details Patient Name: Christopher Mendez, Christopher Mendez. Date of Service: 06/28/2019 12:45 PM Medical Record Number: [id number] Patient Account Number: [id number]  Date of Birth/Sex: [birth year] (69 y.o. M) Treating RN: Cornell Barman Primary Care Provider: Beverlyn Roux Other Clinician: Referring Provider: Beverlyn Roux Treating Provider/Extender: Tito Dine in Treatment: 3 Constitutional Sitting or standing Blood Pressure is within target range for patient.. Pulse regular and within target range for patient.Marland Kitchen Respirations regular, non-labored and within target range.. Temperature is normal and within the target range for the patient.Marland Kitchen appears in no distress. Notes Wound exam; the area in question over the plantar left fifth metatarsal head. Is a deep wound. The granulation did not look as healthy around this area which is a probing central part. Necrotic debris in the center of the wound and around the margins required debridement. There is exposed tendon and bone. Hemostasis with silver nitrate Electronic Signature(s) Signed: 06/28/2019 4:41:52 PM By: Linton Ham MD Entered By: Linton Ham on 06/28/2019 13:07:16 Christopher Mendez ([id number]) -------------------------------------------------------------------------------- Physician Orders Details Patient Name: Christopher Mendez, Christopher Mendez. Date of Service: 06/28/2019 12:45 PM Medical Record Number: [id number] Patient Account Number: [id number] Date of Birth/Sex: Dec 24, 1950 (69 y.o. M) Treating RN: Cornell Barman Primary Care Provider: Beverlyn Roux Other Clinician: Referring Provider: Beverlyn Roux Treating Provider/Extender: Tito Dine in Treatment: 3 Verbal / Phone Orders: No Diagnosis Coding Wound Cleansing o Clean wound with Normal Saline. o May Shower, gently pat wound dry prior to applying new dressing. Anesthetic (add to  Medication List) Wound #1 Right,Lateral,Plantar Foot o Topical Lidocaine 4% cream applied to wound bed prior to debridement (In Clinic Only). Primary Wound Dressing Wound #1 Right,Lateral,Plantar Foot o Silver Collagen - moistened with saline Secondary Dressing Wound #1 Right,Lateral,Plantar Foot o ABD and Kerlix/Conform Dressing Change Frequency Wound #1 Right,Lateral,Plantar Foot o Change dressing every other day. Follow-up Appointments Wound #1 Right,Lateral,Plantar Foot o Return Appointment in 1 week. Edema Control Wound #1 Right,Lateral,Plantar Foot o Elevate legs to the level of the heart and pump ankles as often as possible Off-Loading Wound #1 Right,Lateral,Plantar Foot o Other: [id number] loader Additional Orders / Instructions Wound #1 Right,Lateral,Plantar Foot o Stop Smoking o Increase protein intake. o Other: - Stay off of feet as much as possible. Elevate when sitting. Radiology o MRI, lower extremity with contast - Right Foot, with and without KYZEN, RAINGE ([id number]) Electronic Signature(s) Signed: 06/28/2019 4:41:52 PM By: Linton Ham MD Signed: 06/29/2019 5:22:08 PM By: Gretta Cool, BSN, RN, CWS, Kim RN, BSN Entered By: Gretta Cool, BSN, RN, CWS, Kim on 06/28/2019 13:00:30 LOGIC, HOOCK ([id number]) -------------------------------------------------------------------------------- Problem List Details Patient Name: Christopher Mendez, Christopher Mendez. Date of Service: 06/28/2019 12:45 PM Medical Record Number: [id number] Patient Account Number: [id number] Date of Birth/Sex: [birth year] (69 y.o. 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 Date of Service: 06/28/2019 12:45 PM Medical Record Number: [id number] Patient Account Number: [id number] Date of Birth/Sex: 10-29-1950 (69 y.o. M) Treating RN: Cornell Barman Primary Care Provider: Beverlyn Roux Other Clinician: Referring Provider: Beverlyn Roux Treating Provider/Extender: Tito Dine in Treatment: 3 Subjective History of Present Illness (HPI) ADMISSION 06/07/2019  This is a 69 year old nondiabetic man. He is here accompanied by his wife. He tells me that he had a callus on the right lateral foot over roughly the fifth metatarsal head plantar aspect which he never really bothered about because it did not really bother him. Apparently 2 weeks ago the callus split open and fell off leaving him with a very deep sore with considerable depth. He is already been seen and by his primary physician in the [place] [facility]. Apparently a culture was done and he was prescribed Levaquin for 10 days. We do not have the culture result. Past medical history; prostate CA for which she received radiation and now androgen depletion therapy, COPD, cardiac enlargement. He is a heavy smoker. ABIs in our clinic were 1.03 1/20; this is a nondiabetic man who came in with a deep probing wound over the right lateral foot over the fifth metatarsal head. He says he had a large callus in this area that eventually split off but he was left with a deep wound in approximately 50% of an otherwise dime shaped wound. This went all the way down to periosteum. He was given Levaquin by his primary doctor. 1/27; deep  probing wound over the right lateral foot over the fifth metatarsal head. In general the wound surface looks better. Still a deep probing center part of this wound. We have been using silver collagen 2/3; deep probing wound over the plantar fifth metatarsal head on the right. Exposed tendon and bone. Tissue does not look healthy. We have been using silver collagen and a forefoot offloading boot Objective Constitutional Sitting or standing Blood Pressure is within target range for patient.. Pulse regular and within target range for patient.Marland Kitchen Respirations regular, non-labored and within target range.. Temperature is normal and within the target range for the patient.Marland Kitchen appears in no distress. Vitals Time Taken: 12:35 PM, Height: 73 in, Weight: 238 lbs, BMI: 31.4, Temperature: 98.2 F, Pulse: 76 bpm, Respiratory Rate: 20 breaths/min, Blood Pressure: 138/60 mmHg. General Notes: Wound exam; the area in question over the plantar left fifth metatarsal head. Is a deep wound. The granulation did not look as healthy around this area which is a probing central part. Necrotic debris in the center of the wound and around the margins required debridement. There is exposed tendon and bone. Hemostasis with silver nitrate Christopher Mendez, Christopher Mendez. ([id number]) Integumentary (Hair, Skin) Wound #1 status is Open. Original cause of wound was Blister. The wound is located on the [place]. The wound measures 0.9cm length x 1.4cm width x 0.8cm depth; 0.99cm^2 area and 0.792cm^3 volume. There is bone, tendon, and Fat Layer (Subcutaneous Tissue) Exposed exposed. There is undermining starting at 12:00 and ending at 12:00 with a maximum distance of 0.5cm. There is a medium amount of serous drainage noted. The wound margin is flat and intact. There is small (1-33%) pink granulation within the wound bed. There is a large (67-100%) amount of necrotic tissue within the wound bed including Adherent  Slough. Assessment Active Problems ICD-10 Non-pressure chronic ulcer of other part of right foot with necrosis of bone Cellulitis of right lower limb Procedures Wound #1 Pre-procedure diagnosis of Wound #1 is a Pressure Ulcer located on the Right,Lateral,Plantar Foot . There was a Excisional Skin/Subcutaneous Tissue Debridement with a total area of 1.26 sq cm performed by Ricard Dillon, MD. With the following instrument(s): Curette to remove Viable and Non-Viable tissue/material. Material removed includes Subcutaneous Tissue and Slough and after achieving pain control using Lidocaine. No  specimens were taken. A time out was conducted at 12:56, prior to the start of the procedure. A Large amount of bleeding was controlled with Silver Nitrate. The procedure was tolerated well. Post Debridement Measurements: 0.9cm length x 1.4cm width x 0.8cm depth; 0.792cm^3 volume. Post debridement Stage noted as Category/Stage IV. Character of Wound/Ulcer Post Debridement requires further debridement. Post procedure Diagnosis Wound #1: Same as Pre-Procedure Plan Wound Cleansing: Clean wound with Normal Saline. May Shower, gently pat wound dry prior to applying new dressing. Anesthetic (add to Medication List): Wound #1 Right,Lateral,Plantar Foot: Topical Lidocaine 4% cream applied to wound bed prior to debridement (In Clinic Only). Primary Wound Dressing: Wound #1 Right,Lateral,Plantar Foot: Silver Collagen - moistened with saline Secondary Dressing: Wound #1 Right,Lateral,Plantar Foot: ABD and Kerlix/Conform Dressing Change Frequency: Christopher Mendez, Christopher Mendez. ([id number]) Wound #1 Right,Lateral,Plantar Foot: Change dressing every other day. Follow-up Appointments: Wound #1 Right,Lateral,Plantar Foot: Return Appointment in 1 week. Edema Control: Wound #1 Right,Lateral,Plantar Foot: Elevate legs to the level of the heart and pump ankles as often as possible Off-Loading: Wound #1  Right,Lateral,Plantar Foot: Other: [id number] loader Additional Orders / Instructions: Wound #1 Right,Lateral,Plantar Foot: Stop Smoking Increase protein intake. Other: - Stay off of feet as much as possible. Elevate when sitting. Radiology ordered were: MRI, lower extremity with contast - Right Foot, with and without 1. Culture I did last week was negative [only diphtheroids] 2. This simply does not look healthy. He needs an MRI of the foot to look at the underlying bone 3. I do not have a positive culture but I am going to await the MRI before we go any further on this 4. At some point I would like to put this in a total contact cast but I had like to see the MRI first Electronic Signature(s) Signed: 06/28/2019 4:41:52 PM By: Linton Ham MD Entered By: Linton Ham on 06/28/2019 13:08:35 Christopher Mendez ([id number]) -------------------------------------------------------------------------------- Black Details Patient Name: Christopher Mendez, Christopher Mendez. Date of Service: 06/28/2019 Medical Record Number: [id number] Patient Account Number: [id number] Date of Birth/Sex: 21-Mar-1951 (69 y.o. M) Treating RN: Cornell Barman Primary Care Provider: Beverlyn Roux Other Clinician: Referring Provider: Beverlyn Roux Treating Provider/Extender: Tito Dine in Treatment: 3 Diagnosis Coding ICD-10 Codes Code Description [phone] Non-pressure chronic ulcer of other part of right foot with necrosis of bone L03.115 Cellulitis of right lower limb Facility Procedures CPT4 Code: [id number] Description: [id number] - DEB SUBQ TISSUE 20 SQ CM/< ICD-10 Diagnosis Description L97.514 Non-pressure chronic ulcer of other part of right foot with n Modifier: ecrosis of bone Quantity: 1 Physician Procedures CPT4 Code: [id number] Description: [id number] - WC PHYS SUBQ TISS 20 SQ CM ICD-10 Diagnosis Description L97.514 Non-pressure chronic ulcer of other part of right foot with ne Modifier: crosis of bone Quantity:  1 Electronic Signature(s) Signed: 06/28/2019 4:41:52 PM By: Linton Ham MD Entered By: Linton Ham on 06/28/2019 13:08:58

## 2019-06-29 NOTE — Progress Notes (Signed)
Christopher Mendez ([id number]) Visit Report for 06/28/2019 Arrival Information Details Patient Name: Christopher Mendez, Christopher Mendez. Date of Service: 06/28/2019 12:45 PM Medical Record Number: [id number] Patient Account Number: [id number] Date of Birth/Sex: 04-Oct-1950 (69 y.o. M) Treating RN: Cornell Barman Primary Care Ahjanae Cassel: Beverlyn Roux Other Clinician: Referring Braxtin Bamba: Beverlyn Roux Treating Cecila Satcher/Extender: Tito Dine in Treatment: 3 Visit Information History Since Last Visit Added or deleted any medications: No Patient Arrived: Ambulatory Any new allergies or adverse reactions: No Arrival Time: 12:39 Had a fall or experienced change in No Accompanied By: wife activities of daily living that may affect Transfer Assistance: None risk of falls: Signs or symptoms of abuse/neglect since last visito No Hospitalized since last visit: No Implantable device outside of the clinic excluding No cellular tissue based products placed in the center since last visit: Has Dressing in Place as Prescribed: Yes Pain Present Now: Yes Electronic Signature(s) Signed: 06/28/2019 2:03:05 PM By: Lorine Bears RCP, RRT, CHT Entered By: Lorine Bears on 06/28/2019 12:40:40 Christopher Mendez ([id number]) -------------------------------------------------------------------------------- Encounter Discharge Information Details Patient Name: Christopher Mendez. Date of Service: 06/28/2019 12:45 PM Medical Record Number: [id number] Patient Account Number: [id number] Date of Birth/Sex: Apr 29, 1951 (69 y.o. M) Treating RN: Cornell Barman Primary Care Psalms Olarte: Beverlyn Roux Other Clinician: Referring Patrice Matthew: Beverlyn Roux Treating Danette Weinfeld/Extender: Tito Dine in Treatment: 3 Encounter Discharge Information Items Post Procedure Vitals Discharge Condition: Stable Temperature (F): 98.2 Ambulatory Status: Ambulatory Pulse (bpm): 76 Discharge Destination: Home Respiratory  Rate (breaths/min): 20 Transportation: Private Auto Blood Pressure (mmHg): 138/60 Accompanied By: spouse Schedule Follow-up Appointment: Yes Clinical Summary of Care: Electronic Signature(s) Signed: 06/29/2019 5:22:08 PM By: Gretta Cool, BSN, RN, CWS, Kim RN, BSN Entered By: Gretta Cool, BSN, RN, CWS, Kim on 06/28/2019 13:03:48 Christopher Mendez ([id number]) -------------------------------------------------------------------------------- Lower Extremity Assessment Details Patient Name: Christopher Mendez. Date of Service: 06/28/2019 12:45 PM Medical Record Number: [id number] Patient Account Number: [id number] Date of Birth/Sex: 1950-08-24 (69 y.o. M) Treating RN: Army Melia Primary Care Maksym Pfiffner: Beverlyn Roux Other Clinician: Referring Ramsie Ostrander: Beverlyn Roux Treating Marchele Decock/Extender: Ricard Dillon Weeks in Treatment: 3 Edema Assessment Assessed: [Left: No] [Right: No] Edema: [Left: N] [Right: o] Vascular Assessment Pulses: Dorsalis Pedis Palpable: [Right:Yes] Electronic Signature(s) Signed: 06/28/2019 3:28:35 PM By: Army Melia Entered By: Army Melia on 06/28/2019 12:49:38 Christopher Mendez ([id number]) -------------------------------------------------------------------------------- Multi Wound Chart Details Patient Name: Christopher Mendez. Date of Service: 06/28/2019 12:45 PM Medical Record Number: [id number] Patient Account Number: [id number] Date of Birth/Sex: Jan 31, 1951 (69 y.o. M) Treating RN: Cornell Barman Primary Care Haldon Carley: Beverlyn Roux Other Clinician: Referring Tania Perrott: Beverlyn Roux Treating Trenae Brunke/Extender: Tito Dine in Treatment: 3 Vital Signs Height(in): 73 Pulse(bpm): 43 Weight(lbs): 238 Blood Pressure(mmHg): 138/60 Body Mass Index(BMI): 31 Temperature(F): 98.2 Respiratory Rate 20 (breaths/min): Photos: [N/A:N/A] Wound Location: Right Foot - Plantar, Lateral N/A N/A Wounding Event: Blister N/A N/A Primary Etiology: Pressure Ulcer N/A  N/A Comorbid History: Cataracts, Asthma, Chronic N/A N/A Obstructive Pulmonary Disease (COPD), Sleep Apnea, Hypertension, Received Radiation Date Acquired: 05/26/2019 N/A N/A Weeks of Treatment: 3 N/A N/A Wound Status: Open N/A N/A Pending Amputation on Yes N/A N/A Presentation: Measurements L x W x D 0.9x1.4x0.8 N/A N/A (cm) Area (cm) : 0.99 N/A N/A Volume (cm) : 0.792 N/A N/A % Reduction in Area: 65.00% N/A N/A % Reduction in Volume: 53.30% N/A N/A Starting Position 1 12 (o'clock): Ending Position 1 12 (o'clock): Maximum Distance 1 (cm): 0.5 Undermining: Yes N/A N/A Classification: Category/Stage IV N/A N/A Exudate  Amount: Medium N/A N/A Exudate Type: Serous N/A N/A Exudate Color: amber N/A N/A HILARIO, [name]. ([id number]) Wound Margin: Flat and Intact N/A N/A Granulation Amount: Small (1-33%) N/A N/A Granulation Quality: Pink N/A N/A Necrotic Amount: Large (67-100%) N/A N/A Exposed Structures: Fat Layer (Subcutaneous N/A N/A Tissue) Exposed: Yes Tendon: Yes Bone: Yes Fascia: No Muscle: No Joint: No Epithelialization: None N/A N/A Debridement: Debridement - Excisional N/A N/A Pre-procedure 12:56 N/A N/A Verification/Time Out Taken: Pain Control: Lidocaine N/A N/A Tissue Debrided: Subcutaneous, Slough N/A N/A Level: Skin/Subcutaneous Tissue N/A N/A Debridement Area (sq cm): 1.26 N/A N/A Instrument: Curette N/A N/A Bleeding: Large N/A N/A Hemostasis Achieved: Silver Nitrate N/A N/A Debridement Treatment Procedure was tolerated well N/A N/A Response: Post Debridement 0.9x1.4x0.8 N/A N/A Measurements L x W x D (cm) Post Debridement Volume: 0.792 N/A N/A (cm) Post Debridement Stage: Category/Stage IV N/A N/A Procedures Performed: Debridement N/A N/A Treatment Notes Wound #1 (Right, Lateral, Plantar Foot) Notes Prisma Ag, ABD, conform, front off-loading shoe. Electronic Signature(s) Signed: 06/28/2019 4:41:52 PM By: Linton Ham MD Entered By: Linton Ham on 06/28/2019 13:04:17 Christopher Mendez ([id number]) -------------------------------------------------------------------------------- [place] Details Patient Name: RHYON, Mendez. Date of Service: 06/28/2019 12:45 PM Medical Record Number: [id number] Patient Account Number: [id number] Date of Birth/Sex: Dec 21, 1950 (69 y.o. M) Treating RN: Cornell Barman Primary Care Edem Tiegs: Beverlyn Roux Other Clinician: Referring Kiptyn Rafuse: Beverlyn Roux Treating Cashtyn Pouliot/Extender: Tito Dine in Treatment: 3 Active Inactive Necrotic Tissue Nursing Diagnoses: Impaired tissue integrity related to necrotic/devitalized tissue Goals: Necrotic/devitalized tissue will be minimized in the wound bed Date Initiated: 06/07/2019 Target Resolution Date: 06/28/2019 Goal Status: Active Interventions: Assess patient pain level pre-, during and post procedure and prior to discharge Treatment Activities: Apply topical anesthetic as ordered : 06/07/2019 Notes: Orientation to the Wound Care Program Nursing Diagnoses: Knowledge deficit related to the wound healing center program Goals: Patient/caregiver will verbalize understanding of the [facility] Date Initiated: 06/07/2019 Target Resolution Date: 06/28/2019 Goal Status: Active Interventions: Provide education on orientation to the wound center Notes: Pressure Nursing Diagnoses: Knowledge deficit related to causes and risk factors for pressure ulcer development Goals: Patient will remain free of pressure ulcers Date Initiated: 06/07/2019 Target Resolution Date: 06/28/2019 Goal Status: Active QUENTIN, GERRIOR ([id number]) Interventions: Assess: immobility, friction, shearing, incontinence upon admission and as needed Notes: Soft Tissue Infection Nursing Diagnoses: Potential for infection: soft tissue Goals: Patient's soft tissue infection will resolve Date Initiated: 06/07/2019 Target Resolution Date:  06/28/2019 Goal Status: Active Interventions: Assess signs and symptoms of infection every visit Treatment Activities: Culture and sensitivity : 06/07/2019 Systemic antibiotics : 06/07/2019 Notes: Wound/Skin Impairment Nursing Diagnoses: Impaired tissue integrity Goals: Ulcer/skin breakdown will have a volume reduction of 30% by week 4 Date Initiated: 06/07/2019 Target Resolution Date: 07/08/2019 Goal Status: Active Interventions: Assess ulceration(s) every visit Treatment Activities: Skin care regimen initiated : 06/07/2019 Topical wound management initiated : 06/07/2019 Notes: Electronic Signature(s) Signed: 06/29/2019 5:22:08 PM By: Gretta Cool, BSN, RN, CWS, Kim RN, BSN Entered By: Gretta Cool, BSN, RN, CWS, Kim on 06/28/2019 12:58:52 Christopher Mendez ([id number]) -------------------------------------------------------------------------------- Pain Assessment Details Patient Name: YOUSSOUF, SATTLER. Date of Service: 06/28/2019 12:45 PM Medical Record Number: [id number] Patient Account Number: [id number] Date of Birth/Sex: [birth year] (69 y.o. M) Treating RN: Cornell Barman Primary Care Manfred Laspina: Beverlyn Roux Other Clinician: Referring Pierre Dellarocco: Beverlyn Roux Treating Stanlee Roehrig/Extender: Tito Dine in Treatment: 3 Active Problems Location of Pain Severity and Description of Pain Patient Has Paino Yes Site Locations  Rate the pain. Current Pain Level: 2 Pain Management and Medication Current Pain Management: Electronic Signature(s) Signed: 06/28/2019 2:03:05 PM By: Lorine Bears RCP, RRT, CHT Signed: 06/29/2019 5:22:08 PM By: Gretta Cool, BSN, RN, CWS, Kim RN, BSN Entered By: Lorine Bears on 06/28/2019 12:41:00 Christopher Mendez ([id number]) -------------------------------------------------------------------------------- Patient/Caregiver Education Details Patient Name: DEWIGHT, FEARNOW. Date of Service: 06/28/2019 12:45 PM Medical Record Number:  [id number] Patient Account Number: [id number] Date of Birth/Gender: [birth year] (69 y.o. M) Treating RN: Cornell Barman Primary Care Physician: Beverlyn Roux Other Clinician: Referring Physician: Beverlyn Roux Treating Physician/Extender: Tito Dine in Treatment: 3 Education Assessment Education Provided To: Patient Education Topics Provided Wound Debridement: Handouts: Wound Debridement Methods: Demonstration, Explain/Verbal Responses: State content correctly Wound/Skin Impairment: Handouts: Caring for Your Ulcer Methods: Demonstration, Explain/Verbal Responses: State content correctly Electronic Signature(s) Signed: 06/29/2019 5:22:08 PM By: Gretta Cool, BSN, RN, CWS, Kim RN, BSN Entered By: Gretta Cool, BSN, RN, CWS, Kim on 06/28/2019 13:01:19 Christopher Mendez ([id number]) -------------------------------------------------------------------------------- Wound Assessment Details Patient Name: TREI, BLAICH. Date of Service: 06/28/2019 12:45 PM Medical Record Number: [id number] Patient Account Number: [id number] Date of Birth/Sex: Jul 18, 1950 (69 y.o. M) Treating RN: Army Melia Primary Care Iwao Shamblin: Beverlyn Roux Other Clinician: Referring Meko Masterson: Beverlyn Roux Treating Aaryav Hopfensperger/Extender: Tito Dine in Treatment: 3 Wound Status Wound Number: 1 Primary Pressure Ulcer Etiology: Wound Location: Right Foot - Plantar, Lateral Wound Open Wounding Event: Blister Status: Date Acquired: 05/26/2019 Comorbid Cataracts, Asthma, Chronic Obstructive Weeks Of Treatment: 3 History: Pulmonary Disease (COPD), Sleep Apnea, Clustered Wound: No Hypertension, Received Radiation Pending Amputation On Presentation Photos Wound Measurements Length: (cm) 0.9 % Reduction Width: (cm) 1.4 % Reduction Depth: (cm) 0.8 Epitheliali Area: (cm) 0.99 Underminin Volume: (cm) 0.792 Startin Ending P Maximum in Area: 65% in Volume: 53.3% zation: None g: Yes g Position (o'clock):  12 osition (o'clock): 12 Distance: (cm) 0.5 Wound Description Classification: Category/Stage IV Foul Odor A Wound Margin: Flat and Intact Slough/Fibr Exudate Amount: Medium Exudate Type: Serous Exudate Color: amber fter Cleansing: No ino Yes Wound Bed Granulation Amount: Small (1-33%) Exposed Structure Granulation Quality: Pink Fascia Exposed: No Necrotic Amount: Large (67-100%) Fat Layer (Subcutaneous Tissue) Exposed: Yes Necrotic Quality: Adherent Slough Tendon Exposed: Yes Muscle Exposed: No Joint Exposed: No IKEY, MATSEN ([id number]) Bone Exposed: Yes Treatment Notes Wound #1 (Right, Lateral, Plantar Foot) Notes Prisma Ag, ABD, conform, front off-loading shoe. Electronic Signature(s) Signed: 06/28/2019 3:28:35 PM By: Army Melia Entered By: Army Melia on 06/28/2019 12:47:57 Christopher Mendez ([id number]) -------------------------------------------------------------------------------- Vitals Details Patient Name: UWAIS, BOMMER. Date of Service: 06/28/2019 12:45 PM Medical Record Number: [id number] Patient Account Number: [id number] Date of Birth/Sex: 08-02-1950 (69 y.o. M) Treating RN: Cornell Barman Primary Care Lynnzie Blackson: Beverlyn Roux Other Clinician: Referring Deven Audi: Beverlyn Roux Treating Nasean Zapf/Extender: Tito Dine in Treatment: 3 Vital Signs Time Taken: 12:35 Temperature (F): 98.2 Height (in): 73 Pulse (bpm): 76 Weight (lbs): 238 Respiratory Rate (breaths/min): 20 Body Mass Index (BMI): 31.4 Blood Pressure (mmHg): 138/60 Reference Range: 80 - 120 mg / dl Electronic Signature(s) Signed: 06/28/2019 2:03:05 PM By: Lorine Bears RCP, RRT, CHT Entered By: Lorine Bears on 06/28/2019 12:41:56

## 2019-07-03 ENCOUNTER — Ambulatory Visit
Admission: RE | Admit: 2019-07-03 | Discharge: 2019-07-03 | Disposition: A | Discharge status: Home / Self Care | Payer: Medicare Other | Source: Ambulatory Visit | Attending: Internal Medicine | Admitting: Internal Medicine

## 2019-07-03 ENCOUNTER — Other Ambulatory Visit: Payer: Self-pay

## 2019-07-03 DIAGNOSIS — L97514 Non-pressure chronic ulcer of other part of right foot with necrosis of bone: Secondary | ICD-10-CM | POA: Diagnosis present

## 2019-07-03 LAB — POCT I-STAT CREATININE: Creatinine, Ser: 0.9 mg/dL (ref 0.61–1.24)

## 2019-07-03 MED ORDER — GADOBUTROL 1 MMOL/ML IV SOLN
10.0000 mL | Freq: Once | INTRAVENOUS | Status: AC | PRN
  Administered 2019-07-03: 10 mL via INTRAVENOUS

## 2019-07-05 ENCOUNTER — Encounter: Payer: Medicare Other | Admitting: Internal Medicine

## 2019-07-05 ENCOUNTER — Other Ambulatory Visit: Payer: Self-pay

## 2019-07-05 DIAGNOSIS — L97514 Non-pressure chronic ulcer of other part of right foot with necrosis of bone: Secondary | ICD-10-CM | POA: Diagnosis not present

## 2019-07-06 NOTE — Progress Notes (Addendum)
LINC, FLAMENCO ([id number]) Visit Report for 07/05/2019 HPI Details Patient Name: Christopher Mendez, Christopher Mendez. Date of Service: 07/05/2019 8:00 AM Medical Record Number: [id number] Patient Account Number: [id number] Date of Birth/Sex: 13-Nov-1950 (69 y.o. M) Treating RN: Cornell Barman Primary Care Provider: Beverlyn Roux Other Clinician: Referring Provider: Beverlyn Roux Treating Provider/Extender: Tito Dine in Treatment: 4 History of Present Illness HPI Description: ADMISSION 06/07/2019 This is a 69 year old nondiabetic man. He is here accompanied by his wife. He tells me that he had a callus on the right lateral foot over roughly the fifth metatarsal head plantar aspect which he never really bothered about because it did not really bother him. Apparently 2 weeks ago the callus split open and fell off leaving him with a very deep sore with considerable depth. He is already been seen and by his primary physician in the [place] clinic. Apparently a culture was done and he was prescribed Levaquin for 10 days. We do not have the culture result. Past medical history; prostate CA for which she received radiation and now androgen depletion therapy, COPD, cardiac enlargement. He is a heavy smoker. ABIs in our clinic were 1.03 1/20; this is a nondiabetic man who came in with a deep probing wound over the right lateral foot over the fifth metatarsal head. He says he had a large callus in this area that eventually split off but he was left with a deep wound in approximately 50% of an otherwise dime shaped wound. This went all the way down to periosteum. He was given Levaquin by his primary doctor. 1/27; deep probing wound over the right lateral foot over the fifth metatarsal head. In general the wound surface looks better. Still a deep probing center part of this wound. We have been using silver collagen 2/3; deep probing wound over the plantar fifth metatarsal head on the right. Exposed tendon and  bone. Tissue does not look healthy. We have been using silver collagen and a forefoot offloading boot 2/10; unfortunately the MRI showed osteomyelitis throughout the head and neck of the fifth metatarsal there is also edema enhancement in the distal abductor digit he minimi and flex the digit he minimi compatible with myositis negative for abscess or septic joint. 3 using silver collagen to the wound he is offloading with a forefoot off loader. He is not systemically unwell he has no fever no chills his appetite is good. I am not sure about his offloading compliance Electronic Signature(s) Signed: 07/05/2019 5:43:36 PM By: Linton Ham MD Entered By: Linton Ham on 07/05/2019 08:44:28 [name] ([id number]) -------------------------------------------------------------------------------- Physical Exam Details Patient Name: Christopher Mendez. Date of Service: 07/05/2019 8:00 AM Medical Record Number: [id number] Patient Account Number: [id number] Date of Birth/Sex: [birth year] (69 y.o. M) Treating RN: Cornell Barman Primary Care Provider: Beverlyn Roux Other Clinician: Referring Provider: Beverlyn Roux Treating Provider/Extender: Tito Dine in Treatment: 4 Constitutional Sitting or standing Blood Pressure is within target range for patient.. Pulse regular and within target range for patient.Marland Kitchen Respirations regular, non-labored and within target range.. Temperature is normal and within the target range for the patient.Marland Kitchen appears in no distress. Cardiovascular Pedal pulses are palpable at both the dorsalis pedis and posterior tibia. Swelling and some tenderness in the forefoot. Musculoskeletal Some tenderness in the third fourth and fifth MTPs no erythema. Notes Wound exam oThe area in question is over the plantar left fifth metatarsal head. The wound is come in somewhat and there is not as much obvious tendon or periosteum  as he was when he first came here. No debridement  was done today. Electronic Signature(s) Signed: 07/05/2019 5:43:36 PM By: Linton Ham MD Entered By: Linton Ham on 07/05/2019 08:46:09 [name] ([id number]) -------------------------------------------------------------------------------- Physician Orders Details Patient Name: Christopher Mendez. Date of Service: 07/05/2019 8:00 AM Medical Record Number: [id number] Patient Account Number: [id number] Date of Birth/Sex: June 26, 1950 (69 y.o. M) Treating RN: Cornell Barman Primary Care Provider: Beverlyn Roux Other Clinician: Referring Provider: Beverlyn Roux Treating Provider/Extender: Tito Dine in Treatment: 4 Verbal / Phone Orders: No Diagnosis Coding Wound Cleansing o Clean wound with Normal Saline. o May Shower, gently pat wound dry prior to applying new dressing. Anesthetic (add to Medication List) Wound #1 Right,Lateral,Plantar Foot o Topical Lidocaine 4% cream applied to wound bed prior to debridement (In Clinic Only). Primary Wound Dressing Wound #1 Right,Lateral,Plantar Foot o Silver Collagen - moistened with saline Secondary Dressing Wound #1 Right,Lateral,Plantar Foot o ABD and Kerlix/Conform Dressing Change Frequency Wound #1 Right,Lateral,Plantar Foot o Change dressing every other day. Follow-up Appointments Wound #1 Right,Lateral,Plantar Foot o Return Appointment in 1 week. Edema Control Wound #1 Right,Lateral,Plantar Foot o Elevate legs to the level of the heart and pump ankles as often as possible Off-Loading Wound #1 Right,Lateral,Plantar Foot o Other: [id number] loader Additional Orders / Instructions Wound #1 Right,Lateral,Plantar Foot o Stop Smoking o Increase protein intake. o Other: - Stay off of feet as much as possible. Elevate when sitting. Medications-please add to medication list. Wound #1 Right,Lateral,Plantar Foot o P.O. Antibiotics AUNDRAY, BATZ ([id number]) Consults o Podiatry -  Surgical referral Patient Medications Allergies: No Known Allergies Notifications Medication Indication Start End Levaquin osteomyleitis right 07/05/2019 foot DOSE oral 500 mg tablet - 1 tablet oral daily for 10 days metronidazole osteomyelits right 07/05/2019 foot DOSE oral 500 mg tablet - 1 tablet oral tid for 10 days Electronic Signature(s) Signed: 07/06/2019 5:05:23 PM By: Gretta Cool, BSN, RN, CWS, Kim RN, BSN Signed: 07/07/2019 1:23:38 PM By: Linton Ham MD Previous Signature: 07/05/2019 8:49:41 AM Version By: Linton Ham MD Entered By: Gretta Cool, BSN, RN, CWS, Kim on 07/06/2019 08:16:56 [name] ([id number]) -------------------------------------------------------------------------------- Problem List Details Patient Name: ELWOOD, GERHARD. Date of Service: 07/05/2019 8:00 AM Medical Record Number: [id number] Patient Account Number: [id number] Date of Birth/Sex: 1950/09/14 (69 y.o. 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 Date of Service: 07/05/2019 8:00 AM Medical Record Number: [id number] Patient Account Number: [id number] Date of  Birth/Sex: 29-Sep-1950 (69 y.o. M) Treating RN: Cornell Barman Primary Care Provider: Beverlyn Roux Other Clinician: Referring Provider: Beverlyn Roux Treating Provider/Extender: Tito Dine in Treatment: 4 Subjective History of Present Illness (HPI) ADMISSION 06/07/2019 This is a 69 year old nondiabetic man. He is here accompanied by his wife. He tells me that he had a callus on the right lateral foot over roughly the fifth metatarsal head plantar aspect which he never really  bothered about because it did not really bother him. Apparently 2 weeks ago the callus split open and fell off leaving him with a very deep sore with considerable depth. He is already been seen and by his primary physician in the [place] [facility]. Apparently a culture was done and he was prescribed Levaquin for 10 days. We do not have the culture result. Past medical history; prostate CA for which she received radiation and now androgen depletion therapy, COPD, cardiac enlargement. He is a heavy smoker. ABIs in our clinic were 1.03 1/20; this is a nondiabetic man who came in with a deep probing wound over the right lateral foot over the fifth metatarsal head. He says he had a large callus in this area that eventually split off but he was left with a deep wound in approximately 50% of an otherwise dime shaped wound. This went all the way down to periosteum. He was given Levaquin by his primary doctor. 1/27; deep probing wound over the right lateral foot over the fifth metatarsal head. In general the wound surface looks better. Still a deep probing center part of this wound. We have been using silver collagen 2/3; deep probing wound over the plantar fifth metatarsal head on the right. Exposed tendon and bone. Tissue does not look healthy. We have been using silver collagen and a forefoot offloading boot 2/10; unfortunately the MRI showed osteomyelitis throughout the head and neck of the fifth metatarsal there is also  edema enhancement in the distal abductor digit he minimi and flex the digit he minimi compatible with myositis negative for abscess or septic joint. 3 using silver collagen to the wound he is offloading with a forefoot off loader. He is not systemically unwell he has no fever no chills his appetite is good. I am not sure about his offloading compliance Objective Constitutional Sitting or standing Blood Pressure is within target range for patient.. Pulse regular and within target range for patient.Marland Kitchen Respirations regular, non-labored and within target range.. Temperature is normal and within the target range for the patient.Marland Kitchen appears in no distress. Vitals Time Taken: 8:07 AM, Height: 73 in, Weight: 238 lbs, BMI: 31.4, Temperature: 98.0 F, Pulse: 67 bpm, Respiratory Rate: 16 breaths/min, Blood Pressure: 138/43 mmHg. Cardiovascular NYJEL, GABINO. ([id number]) Pedal pulses are palpable at both the dorsalis pedis and posterior tibia. Swelling and some tenderness in the forefoot. Musculoskeletal Some tenderness in the third fourth and fifth MTPs no erythema. General Notes: Wound exam The area in question is over the plantar left fifth metatarsal head. The wound is come in somewhat and there is not as much obvious tendon or periosteum as he was when he first came here. No debridement was done today. Integumentary (Hair, Skin) Wound #1 status is Open. Original cause of wound was Blister. The wound is located on the [place]. The wound measures 0.9cm length x 1.3cm width x 0.7cm depth; 0.919cm^2 area and 0.643cm^3 volume. There is tendon and Fat Layer (Subcutaneous Tissue) Exposed exposed. There is no tunneling or undermining noted. There is a medium amount of serous drainage noted. The wound margin is flat and intact. There is medium (34-66%) pink granulation within the wound bed. There is a medium (34-66%) amount of necrotic tissue within the wound bed including Adherent  Slough. 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 May Shower, gently pat wound  dry prior to applying new dressing. Anesthetic (add to Medication List): Wound #1 Right,Lateral,Plantar Foot: Topical Lidocaine 4% cream applied to wound bed prior to debridement (In Clinic Only). Primary Wound Dressing: Wound #1 Right,Lateral,Plantar Foot: Silver Collagen - moistened with saline Secondary Dressing: Wound #1 Right,Lateral,Plantar Foot: ABD and Kerlix/Conform Dressing Change Frequency: Wound #1 Right,Lateral,Plantar Foot: Change dressing every other day. Follow-up Appointments: Wound #1 Right,Lateral,Plantar Foot: Return Appointment in 1 week. Edema Control: Wound #1 Right,Lateral,Plantar Foot: Elevate legs to the level of the heart and pump ankles as often as possible Off-Loading: Wound #1 Right,Lateral,Plantar Foot: IYAD, MCVOY ([id number]) Other: - Front-off loader Additional Orders / Instructions: Wound #1 Right,Lateral,Plantar Foot: Stop Smoking Increase protein intake. Other: - Stay off of feet as much as possible. Elevate when sitting. Medications-please add to medication list.: Wound #1 Right,Lateral,Plantar Foot: P.O. Antibiotics The following medication(s) was prescribed: Levaquin oral 500 mg tablet 1 tablet oral daily for 10 days for osteomyleitis right foot starting 07/05/2019 metronidazole oral 500 mg tablet 1 tablet oral tid for 10 days for osteomyelits right foot starting 07/05/2019 1. Patient with osteomyelitis involving the fifth metatarsal head on the right as well as the neck of the fifth metatarsal. Possibly soft tissue infection involving the distal abductor digital minimi and flexor digit he minimi compatible with myositis. 2. In the most unbiased way that I can I presented the  patient's 2 options which are a ray amputation on the right fifth ray and antibiotics plus or minus HBO. He does not have a co-pay insurance therefore HBO will not be an option. The patient will take 2 days to make a decision he feels 3. Empiric Levaquin and Flagyl. Culture I did of this wound that was negative. The patient came in on Levaquin based on a culture done in his primary office but I do not think I ever saw this 4. Depending on which option he decides we would refer to orthopedics and/or podiatric surgery and/or infectious disease. If he chooses to try and IV antibiotics I will try to do a bone culture off antibiotics. Right now this will not be easy 5. He has not had arterial studies but his ABI in our clinic was normal and his peripheral pulses are vibrant. I do not believe that this is an active problem with regards to either decision the patient makes Electronic Signature(s) Signed: 07/05/2019 5:43:36 PM By: Linton Ham MD Entered By: Linton Ham on 07/05/2019 08:53:12 [name] ([id number]) -------------------------------------------------------------------------------- SuperBill Details Patient Name: MERVILLE, TANNENBAUM. Date of Service: 07/05/2019 Medical Record Number: [id number] Patient Account Number: [id number] Date of Birth/Sex: [birth year] (69 y.o. M) Treating RN: Cornell Barman Primary Care Provider: Beverlyn Roux Other Clinician: Referring Provider: Beverlyn Roux Treating Provider/Extender: Tito Dine in Treatment: 4 Diagnosis Coding ICD-10 Codes Code Description L97.514 Non-pressure chronic ulcer of other part of right foot with necrosis of bone M86.671 Other chronic osteomyelitis, right ankle and foot Facility Procedures CPT4 Code: [id number] Description: [facility] VISIT-LEV 3 EST PT Modifier: Quantity: 1 Physician Procedures CPT4 Code: [id number] Description: [id number] - WC PHYS LEVEL 4 - EST PT ICD-10 Diagnosis Description L97.514  Non-pressure chronic ulcer of other part of right foot with ne M86.671 Other chronic osteomyelitis, right ankle and foot Modifier: crosis of bone Quantity: 1 Electronic Signature(s) Signed: 07/05/2019 5:43:36 PM By: Linton Ham MD Entered By: Linton Ham on 07/05/2019 08:53:39

## 2019-07-06 NOTE — Progress Notes (Addendum)
Christopher Mendez ([id number]) Visit Report for 07/05/2019 Arrival Information Details Patient Name: Christopher Mendez, Christopher Mendez. Date of Service: 07/05/2019 8:00 AM Medical Record Number: [id number] Patient Account Number: [id number] Date of Birth/Sex: [birth year] (69 y.o. M) Treating Mendez: Christopher Mendez Primary Care Christopher Mendez: Christopher Mendez Other Clinician: Referring Christopher Mendez: Christopher Mendez Treating Christopher Mendez/Extender: Christopher Mendez in Treatment: 4 Visit Information History Since Last Visit Added or deleted any medications: No Patient Arrived: Ambulatory Any new allergies or adverse reactions: No Arrival Time: 08:05 Had a fall or experienced change in No Accompanied By: spouse activities of daily living that may affect Transfer Assistance: None risk of falls: Patient Identification Verified: Yes Signs or symptoms of abuse/neglect since last visito No Secondary Verification Process Completed: Yes Hospitalized since last visit: No Implantable device outside of the clinic excluding No cellular tissue based products placed in the center since last visit: Has Dressing in Place as Prescribed: Yes Pain Present Now: No Electronic Signature(s) Signed: 07/05/2019 4:52:40 PM By: Christopher Mendez Entered By: Christopher Mendez on 07/05/2019 08:07:46 Christopher Mendez ([id number]) -------------------------------------------------------------------------------- Clinic Level of Care Assessment Details Patient Name: Christopher Mendez. Date of Service: 07/05/2019 8:00 AM Medical Record Number: [id number] Patient Account Number: [id number] Date of Birth/Sex: 09/16/1950 (69 y.o. M) Treating Mendez: Christopher Mendez Primary Care Christopher Mendez: Christopher Mendez Other Clinician: Referring Christopher Mendez: Christopher Mendez Treating Christopher Mendez/Extender: Christopher Mendez in Treatment: 4 Clinic Level of Care Assessment Items TOOL 4 Quantity Score []  - Use when only an EandM is performed on FOLLOW-UP visit 0 ASSESSMENTS - Nursing  Assessment / Reassessment X - Reassessment of Co-morbidities (includes updates in patient status) 1 10 X- 1 5 Reassessment of Adherence to Treatment Plan ASSESSMENTS - Wound and Skin Assessment / Reassessment X - Simple Wound Assessment / Reassessment - one wound 1 5 []  - 0 Complex Wound Assessment / Reassessment - multiple wounds []  - 0 Dermatologic / Skin Assessment (not related to wound area) ASSESSMENTS - Focused Assessment []  - Circumferential Edema Measurements - multi extremities 0 []  - 0 Nutritional Assessment / Counseling / Intervention []  - 0 Lower Extremity Assessment (monofilament, tuning fork, pulses) []  - 0 Peripheral Arterial Disease Assessment (using hand held doppler) ASSESSMENTS - Ostomy and/or Continence Assessment and Care []  - Incontinence Assessment and Management 0 []  - 0 Ostomy Care Assessment and Management (repouching, etc.) PROCESS - Coordination of Care X - Simple Patient / Family Education for ongoing care 1 15 []  - 0 Complex (extensive) Patient / Family Education for ongoing care []  - 0 Staff obtains Programmer, systems, Records, Test Results / Process Orders []  - 0 Staff telephones HHA, Nursing Homes / Clarify orders / etc []  - 0 Routine Transfer to another Facility (non-emergent condition) []  - 0 Routine Hospital Admission (non-emergent condition) []  - 0 New Admissions / Biomedical engineer / Ordering NPWT, Apligraf, etc. []  - 0 Emergency Hospital Admission (emergent condition) X- 1 10 Simple Discharge Coordination Christopher Mendez, [name] ([id number]) []  - 0 Complex (extensive) Discharge Coordination PROCESS - Special Needs []  - Pediatric / Minor Patient Management 0 []  - 0 Isolation Patient Management []  - 0 Hearing / Language / Visual special needs []  - 0 Assessment of Community assistance (transportation, D/C planning, etc.) []  - 0 Additional assistance / Altered mentation []  - 0 Support Surface(s) Assessment (bed, cushion, seat,  etc.) INTERVENTIONS - Wound Cleansing / Measurement X - Simple Wound Cleansing - one wound 1 5 []  - 0 Complex Wound Cleansing - multiple wounds X- 1 5 Wound Imaging (  photographs - any number of wounds) []  - 0 Wound Tracing (instead of photographs) X- 1 5 Simple Wound Measurement - one wound []  - 0 Complex Wound Measurement - multiple wounds INTERVENTIONS - Wound Dressings []  - Small Wound Dressing one or multiple wounds 0 X- 1 15 Medium Wound Dressing one or multiple wounds []  - 0 Large Wound Dressing one or multiple wounds []  - 0 Application of Medications - topical []  - 0 Application of Medications - injection INTERVENTIONS - Miscellaneous []  - External ear exam 0 []  - 0 Specimen Collection (cultures, biopsies, blood, body fluids, etc.) []  - 0 Specimen(s) / Culture(s) sent or taken to Lab for analysis []  - 0 Patient Transfer (multiple staff / Civil Service fast streamer / Similar devices) []  - 0 Simple Staple / Suture removal (25 or less) []  - 0 Complex Staple / Suture removal (26 or more) []  - 0 Hypo / Hyperglycemic Management (close monitor of Blood Glucose) []  - 0 Ankle / Brachial Index (ABI) - do not check if billed separately X- 1 5 Vital Signs Christopher Mendez, Christopher Mendez ([id number]) Has the patient been seen at the hospital within the last three years: Yes Total Score: 80 Level Of Care: New/Established - Level 3 Electronic Signature(s) Signed: 07/05/2019 5:56:57 PM By: Christopher Mendez, BSN, Mendez, CWS, Christopher Mendez, BSN Entered By: Christopher Mendez, BSN, Mendez, CWS, Christopher on 07/05/2019 08:36:47 Christopher Mendez ([id number]) -------------------------------------------------------------------------------- Encounter Discharge Information Details Patient Name: Christopher Mendez, [name] Date of Service: 07/05/2019 8:00 AM Medical Record Number: [id number] Patient Account Number: [id number] Date of Birth/Sex: 08-11-1950 (69 y.o. M) Treating Mendez: Christopher Mendez Primary Care Adamary Savary: Christopher Mendez Other Clinician: Referring Yutaka Holberg:  Christopher Mendez Treating Lindberg Zenon/Extender: Christopher Mendez in Treatment: 4 Encounter Discharge Information Items Discharge Condition: Stable Ambulatory Status: Ambulatory Discharge Destination: Home Transportation: Private Auto Accompanied By: self Schedule Follow-up Appointment: Yes Clinical Summary of Care: Electronic Signature(s) Signed: 07/05/2019 5:56:57 PM By: Christopher Mendez, BSN, Mendez, CWS, Christopher Mendez, BSN Entered By: Christopher Mendez, BSN, Mendez, CWS, Christopher on 07/05/2019 08:38:05 Christopher Mendez ([id number]) -------------------------------------------------------------------------------- Lower Extremity Assessment Details Patient Name: Christopher Mendez, BOLOTIN. Date of Service: 07/05/2019 8:00 AM Medical Record Number: [id number] Patient Account Number: [id number] Date of Birth/Sex: [birth year] (69 y.o. M) Treating Mendez: Christopher Mendez Primary Care Mahlet Jergens: Christopher Mendez Other Clinician: Referring Natalyn Szymanowski: Christopher Mendez Treating Quintina Hakeem/Extender: Christopher Mendez in Treatment: 4 Edema Assessment Assessed: [Left: No] [Right: No] Edema: [Left: N] [Right: o] Vascular Assessment Pulses: Dorsalis Pedis Palpable: [Right:Yes] Notes dorsal right foot around 3,4,5 toes feels like it has fluid under it, MD notified Electronic Signature(s) Signed: 07/05/2019 4:52:40 PM By: Christopher Mendez Entered By: Christopher Mendez on 07/05/2019 08:15:38 Christopher Mendez ([id number]) -------------------------------------------------------------------------------- Multi Wound Chart Details Patient Name: Christopher Mendez. Date of Service: 07/05/2019 8:00 AM Medical Record Number: [id number] Patient Account Number: [id number] Date of Birth/Sex: 1951/05/07 (69 y.o. M) Treating Mendez: Christopher Mendez Primary Care Jaima Janney: Christopher Mendez Other Clinician: Referring Osaze Hubbert: Christopher Mendez Treating Breena Bevacqua/Extender: Christopher Mendez in Treatment: 4 Vital Signs Height(in): 73 Pulse(bpm): 97 Weight(lbs): 238 Blood  Pressure(mmHg): 138/43 Body Mass Index(BMI): 31 Temperature(F): 98.0 Respiratory Rate 16 (breaths/min): Photos: [N/A:N/A] Wound Location: Right Foot - Plantar, Lateral N/A N/A Wounding Event: Blister N/A N/A Primary Etiology: Pressure Ulcer N/A N/A Comorbid History: Cataracts, Asthma, Chronic N/A N/A Obstructive Pulmonary Disease (COPD), Sleep Apnea, Hypertension, Received Radiation Date Acquired: 05/26/2019 N/A N/A Weeks of Treatment: 4 N/A N/A Wound Status: Open N/A N/A Pending Amputation on Yes N/A N/A Presentation: Measurements L x  W x D 0.9x1.3x0.7 N/A N/A (cm) Area (cm) : 0.919 N/A N/A Volume (cm) : 0.643 N/A N/A % Reduction in Area: 67.50% N/A N/A % Reduction in Volume: 62.10% N/A N/A Classification: Category/Stage IV N/A N/A Exudate Amount: Medium N/A N/A Exudate Type: Serous N/A N/A Exudate Color: amber N/A N/A Wound Margin: Flat and Intact N/A N/A Granulation Amount: Medium (34-66%) N/A N/A Granulation Quality: Pink N/A N/A Necrotic Amount: Medium (34-66%) N/A N/A Exposed Structures: Fat Layer (Subcutaneous N/A N/A Tissue) Exposed: Christopher Mendez, Christopher Mendez ([id number]) Tendon: Yes Fascia: No Muscle: No Joint: No Bone: No Epithelialization: None N/A N/A Treatment Notes Wound #1 (Right, Lateral, Plantar Foot) Notes Prisma Ag, ABD, conform, front off-loading shoe. Electronic Signature(s) Signed: 07/05/2019 5:43:36 PM By: Linton Ham MD Entered By: Linton Ham on 07/05/2019 08:43:24 Christopher Mendez ([id number]) -------------------------------------------------------------------------------- [facility] Details Patient Name: Christopher Mendez, BOURRET. Date of Service: 07/05/2019 8:00 AM Medical Record Number: [id number] Patient Account Number: [id number] Date of Birth/Sex: [birth year] (69 y.o. M) Treating Mendez: Christopher Mendez Primary Care Angellica Maddison: Christopher Mendez Other Clinician: Referring Kamsiyochukwu Buist: Christopher Mendez Treating Donn Zanetti/Extender: Christopher Mendez in Treatment: 4 Active Inactive Electronic Signature(s) Signed: 07/14/2019 4:29:31 PM By: Christopher Mendez, BSN, Mendez, CWS, Christopher Mendez, BSN Previous Signature: 07/14/2019 4:26:55 PM Version By: Christopher Mendez BSN, Mendez, CWS, Christopher Mendez, BSN Previous Signature: 07/05/2019 5:56:57 PM Version By: Christopher Mendez, BSN, Mendez, CWS, Christopher Mendez, BSN Entered By: Christopher Mendez, BSN, Mendez, CWS, Christopher on 07/14/2019 16:29:31 Christopher Mendez, Christopher Mendez ([id number]) -------------------------------------------------------------------------------- Pain Assessment Details Patient Name: Christopher Mendez, Christopher Mendez. Date of Service: 07/05/2019 8:00 AM Medical Record Number: [id number] Patient Account Number: [id number] Date of Birth/Sex: 03/07/1951 (69 y.o. M) Treating Mendez: Christopher Mendez Primary Care Tyiana Hill: Christopher Mendez Other Clinician: Referring Griffin Dewilde: Christopher Mendez Treating Tongela Encinas/Extender: Christopher Mendez in Treatment: 4 Active Problems Location of Pain Severity and Description of Pain Patient Has Paino Yes Site Locations Pain Location: Pain in Ulcers With Dressing Change: Yes Duration of the Pain. Constant / Intermittento Intermittent Pain Management and Medication Current Pain Management: Electronic Signature(s) Signed: 07/05/2019 4:52:40 PM By: Christopher Mendez Entered By: Christopher Mendez on 07/05/2019 08:07:57 Christopher Mendez ([id number]) -------------------------------------------------------------------------------- Patient/Caregiver Education Details Patient Name: Christopher Mendez. Date of Service: 07/05/2019 8:00 AM Medical Record Number: [id number] Patient Account Number: [id number] Date of Birth/Gender: June 12, 1950 (69 y.o. M) Treating Mendez: Christopher Mendez Primary Care Physician: Christopher Mendez Other Clinician: Referring Physician: Beverlyn Mendez Treating Physician/Extender: Christopher Mendez in Treatment: 4 Education Assessment Education Provided To: Patient Education Topics Provided Infection: Handouts: Infection Prevention and  Management Methods: Demonstration, Explain/Verbal Responses: State content correctly Wound/Skin Impairment: Handouts: Caring for Your Ulcer, Other: continue wound care as prescribed Methods: Demonstration, Explain/Verbal Responses: State content correctly Electronic Signature(s) Signed: 07/05/2019 5:56:57 PM By: Christopher Mendez, BSN, Mendez, CWS, Christopher Mendez, BSN Entered By: Christopher Mendez, BSN, Mendez, CWS, Christopher on 07/05/2019 08:37:35 Christopher Mendez ([id number]) -------------------------------------------------------------------------------- Wound Assessment Details Patient Name: Christopher Mendez, Christopher Mendez. Date of Service: 07/05/2019 8:00 AM Medical Record Number: [id number] Patient Account Number: [id number] Date of Birth/Sex: 07-25-1950 (69 y.o. M) Treating Mendez: Christopher Mendez Primary Care Alailah Safley: Christopher Mendez Other Clinician: Referring Damari Suastegui: Christopher Mendez Treating Sennie Borden/Extender: Christopher Mendez in Treatment: 4 Wound Status Wound Number: 1 Primary Pressure Ulcer Etiology: Wound Location: Right Foot - Plantar, Lateral Wound Open Wounding Event: Blister Status: Date Acquired: 05/26/2019 Comorbid Cataracts, Asthma, Chronic Obstructive Weeks Of Treatment: 4 History: Pulmonary Disease (COPD), Sleep Apnea, Clustered Wound: No Hypertension, Received Radiation Pending Amputation On Presentation Photos  Wound Measurements Length: (cm) 0.9 % Reduction Width: (cm) 1.3 % Reduction Depth: (cm) 0.7 Epitheliali Area: (cm) 0.919 Tunneling: Volume: (cm) 0.643 Underminin in Area: 67.5% in Volume: 62.1% zation: None No g: No Wound Description Classification: Category/Stage IV Wound Margin: Flat and Intact Exudate Amount: Medium Exudate Type: Serous Exudate Color: amber Foul Odor After Cleansing: No Slough/Fibrino Yes Wound Bed Granulation Amount: Medium (34-66%) Exposed Structure Granulation Quality: Pink Fascia Exposed: No Necrotic Amount: Medium (34-66%) Fat Layer (Subcutaneous Tissue) Exposed:  Yes Necrotic Quality: Adherent Slough Tendon Exposed: Yes Muscle Exposed: No Joint Exposed: No Bone Exposed: No Christopher Mendez, Christopher Mendez ([id number]) Electronic Signature(s) Signed: 07/05/2019 4:52:40 PM By: Christopher Mendez Entered By: Christopher Mendez on 07/05/2019 08:14:47 Christopher Mendez ([id number]) -------------------------------------------------------------------------------- Vitals Details Patient Name: Christopher Mendez. Date of Service: 07/05/2019 8:00 AM Medical Record Number: [id number] Patient Account Number: [id number] Date of Birth/Sex: [birth year] (69 y.o. M) Treating Mendez: Christopher Mendez Primary Care Judyth Demarais: Christopher Mendez Other Clinician: Referring Akisha Sturgill: Christopher Mendez Treating Rayn Enderson/Extender: Christopher Mendez in Treatment: 4 Vital Signs Time Taken: 08:07 Temperature (F): 98.0 Height (in): 73 Pulse (bpm): 67 Weight (lbs): 238 Respiratory Rate (breaths/min): 16 Body Mass Index (BMI): 31.4 Blood Pressure (mmHg): 138/43 Reference Range: 80 - 120 mg / dl Electronic Signature(s) Signed: 07/05/2019 4:52:40 PM By: Christopher Mendez Entered By: Christopher Mendez on 07/05/2019 08:09:03

## 2019-07-10 ENCOUNTER — Other Ambulatory Visit: Payer: Self-pay | Admitting: Podiatry

## 2019-07-12 ENCOUNTER — Other Ambulatory Visit: Payer: Self-pay

## 2019-07-12 ENCOUNTER — Ambulatory Visit: Payer: Medicare Other | Admitting: Internal Medicine

## 2019-07-12 ENCOUNTER — Encounter
Admission: RE | Admit: 2019-07-12 | Discharge: 2019-07-12 | Disposition: A | Discharge status: Home / Self Care | Payer: Medicare Other | Source: Ambulatory Visit | Attending: Podiatry | Admitting: Podiatry

## 2019-07-12 NOTE — Patient Instructions (Addendum)
Your procedure is scheduled on: Friday 07/14/19.                                                                Report to DAY SURGERY DEPARTMENT LOCATED ON 2ND FLOOR MEDICAL MALL ENTRANCE. To find out your arrival time please call [phone] between 1PM - 3PM on Thursday 07/13/19.   Remember: Instructions that are not followed completely may result in serious medical risk, up to and including death, or upon the discretion of your surgeon and anesthesiologist your surgery may need to be rescheduled.      _X__ 1. Do not eat food after midnight the night before your procedure.                 No gum chewing or hard candies. You may drink clear liquids up to 2 hours                 before you are scheduled to arrive for your surgery- DO NOT drink clear                 liquids within 2 hours of the start of your surgery.                 Clear Liquids include:  water, apple juice without pulp, clear carbohydrate                 drink such as Clearfast or Gatorade, Black Coffee or Tea (Do not add                 anything to coffee or tea).  ** Dr. Luana Shu wants you to drink the clear Pre-Surgery Ensure provided for you in your bag. Finish the drink 2 hours prior to your arrival time on the morning of your surgery.    __X__2.  On the morning of surgery brush your teeth with toothpaste and water, you may rinse your mouth with mouthwash if you wish.  Do not swallow any toothpaste or mouthwash.       _X__ 3.  No Alcohol for 24 hours before or after surgery.     _X__ 4.  Do Not Smoke or use e-cigarettes For 24 Hours Prior to Your Surgery.                 Do not use any chewable tobacco products for at least 6 hours prior to                 Surgery.    __X__5.  Notify your doctor if there is any change in your medical condition      (cold, fever, infections).      Do not wear jewelry, make-up, hairpins, clips or nail polish. Do not wear lotions, powders, or perfumes.  Do not shave 48  hours prior to surgery. Men may shave face and neck. Do not bring valuables to the hospital.     [facility] is not responsible for any belongings or valuables.   Contacts, dentures/partials or body piercings may not be worn into surgery. Bring a case for your contacts, glasses or hearing aids, a denture cup will be supplied. Leave your suitcase in the car. After surgery it may be brought to your room. For patients  admitted to the hospital, discharge time is determined by your treatment team.   Patients discharged the day of surgery will not be allowed to drive home.   Please read over the following fact sheets that you were given:   MRSA Information  __X__ Take these medicines the morning of surgery with A SIP OF WATER:     1. albuterol (VENTOLIN HFA) 108   2. Fluticasone-Salmeterol (ADVAIR)  3. amLODipine (NORVASC)  4. levofloxacin (LEVAQUIN)  5. metroNIDAZOLE (FLAGYL)  6. omeprazole (PRILOSEC)      __X__ Use CHG Soap/SAGE wipes as directed    _ X___ Use inhalers on the day of surgery. Also bring the inhaler with you to the hospital on the morning of surgery.    __X__ Stop Anti-inflammatories 7 days before surgery such as Advil, Ibuprofen, Motrin, BC or Goodies Powder, Naprosyn, Naproxen, Aleve, Aspirin, Meloxicam. May take Tylenol if needed for pain or discomfort.    __X__ Don't start taking any new herbal supplements prior to your procedure.

## 2019-07-13 ENCOUNTER — Other Ambulatory Visit
Admission: RE | Admit: 2019-07-13 | Discharge: 2019-07-13 | Disposition: A | Discharge status: Home / Self Care | Payer: Medicare Other | Source: Ambulatory Visit | Attending: Podiatry | Admitting: Podiatry

## 2019-07-13 DIAGNOSIS — Z01812 Encounter for preprocedural laboratory examination: Secondary | ICD-10-CM | POA: Insufficient documentation

## 2019-07-13 DIAGNOSIS — Z20822 Contact with and (suspected) exposure to covid-19: Secondary | ICD-10-CM | POA: Diagnosis not present

## 2019-07-13 LAB — SARS CORONAVIRUS 2 (TAT 6-24 HRS): SARS Coronavirus 2: NEGATIVE

## 2019-07-14 ENCOUNTER — Encounter
Admission: RE | Disposition: A | Discharge status: Home / Self Care | Payer: Self-pay | Source: Ambulatory Visit | Attending: Podiatry

## 2019-07-14 ENCOUNTER — Other Ambulatory Visit: Payer: Self-pay

## 2019-07-14 ENCOUNTER — Ambulatory Visit: Payer: Medicare Other | Admitting: Certified Registered Nurse Anesthetist

## 2019-07-14 ENCOUNTER — Ambulatory Visit
Admission: RE | Admit: 2019-07-14 | Discharge: 2019-07-14 | Disposition: A | Discharge status: Home / Self Care | Payer: Medicare Other | Source: Ambulatory Visit | Attending: Podiatry | Admitting: Podiatry

## 2019-07-14 ENCOUNTER — Encounter: Payer: Self-pay | Admitting: Podiatry

## 2019-07-14 DIAGNOSIS — F17200 Nicotine dependence, unspecified, uncomplicated: Secondary | ICD-10-CM | POA: Insufficient documentation

## 2019-07-14 DIAGNOSIS — E1169 Type 2 diabetes mellitus with other specified complication: Secondary | ICD-10-CM | POA: Insufficient documentation

## 2019-07-14 DIAGNOSIS — M86171 Other acute osteomyelitis, right ankle and foot: Secondary | ICD-10-CM

## 2019-07-14 DIAGNOSIS — L03115 Cellulitis of right lower limb: Secondary | ICD-10-CM | POA: Diagnosis not present

## 2019-07-14 DIAGNOSIS — M869 Osteomyelitis, unspecified: Secondary | ICD-10-CM | POA: Insufficient documentation

## 2019-07-14 DIAGNOSIS — Z7951 Long term (current) use of inhaled steroids: Secondary | ICD-10-CM | POA: Insufficient documentation

## 2019-07-14 DIAGNOSIS — J449 Chronic obstructive pulmonary disease, unspecified: Secondary | ICD-10-CM | POA: Insufficient documentation

## 2019-07-14 DIAGNOSIS — Z79899 Other long term (current) drug therapy: Secondary | ICD-10-CM | POA: Diagnosis not present

## 2019-07-14 DIAGNOSIS — I119 Hypertensive heart disease without heart failure: Secondary | ICD-10-CM | POA: Insufficient documentation

## 2019-07-14 DIAGNOSIS — E1142 Type 2 diabetes mellitus with diabetic polyneuropathy: Secondary | ICD-10-CM | POA: Diagnosis not present

## 2019-07-14 DIAGNOSIS — E11621 Type 2 diabetes mellitus with foot ulcer: Secondary | ICD-10-CM | POA: Insufficient documentation

## 2019-07-14 DIAGNOSIS — E1151 Type 2 diabetes mellitus with diabetic peripheral angiopathy without gangrene: Secondary | ICD-10-CM | POA: Insufficient documentation

## 2019-07-14 HISTORY — PX: AMPUTATION TOE: SHX6595

## 2019-07-14 SURGERY — AMPUTATION, TOE
Anesthesia: General | Site: Toe | Laterality: Right

## 2019-07-14 MED ORDER — VANCOMYCIN HCL 1000 MG IV SOLR
INTRAVENOUS | Status: AC
  Filled 2019-07-14: qty 1000

## 2019-07-14 MED ORDER — MIDAZOLAM HCL 2 MG/2ML IJ SOLN
INTRAMUSCULAR | Status: AC
  Filled 2019-07-14: qty 2

## 2019-07-14 MED ORDER — FENTANYL CITRATE (PF) 100 MCG/2ML IJ SOLN
INTRAMUSCULAR | Status: AC
  Filled 2019-07-14: qty 2

## 2019-07-14 MED ORDER — NEOMYCIN-POLYMYXIN B GU 40-200000 IR SOLN
Status: AC
  Filled 2019-07-14: qty 20

## 2019-07-14 MED ORDER — OXYCODONE-ACETAMINOPHEN 7.5-325 MG PO TABS: 1.0000 | ORAL_TABLET | Freq: Four times a day (QID) | ORAL | 0 refills | Status: AC | PRN

## 2019-07-14 MED ORDER — PROPOFOL 500 MG/50ML IV EMUL
INTRAVENOUS | Status: AC
  Filled 2019-07-14: qty 50

## 2019-07-14 MED ORDER — IPRATROPIUM-ALBUTEROL 0.5-2.5 (3) MG/3ML IN SOLN: 3.0000 mL | Freq: Once | RESPIRATORY_TRACT | Status: AC

## 2019-07-14 MED ORDER — PROPOFOL 500 MG/50ML IV EMUL
INTRAVENOUS | Status: DC | PRN
  Administered 2019-07-14: 140 ug/kg/min via INTRAVENOUS

## 2019-07-14 MED ORDER — FENTANYL CITRATE (PF) 100 MCG/2ML IJ SOLN: 25.0000 ug | INTRAMUSCULAR | Status: DC | PRN

## 2019-07-14 MED ORDER — DOXYCYCLINE MONOHYDRATE 100 MG PO TABS: 100.0000 mg | ORAL_TABLET | Freq: Two times a day (BID) | ORAL | 0 refills | Status: DC

## 2019-07-14 MED ORDER — POVIDONE-IODINE 7.5 % EX SOLN
Freq: Once | CUTANEOUS | Status: DC
  Filled 2019-07-14: qty 118

## 2019-07-14 MED ORDER — ONDANSETRON HCL 4 MG PO TABS: 4.0000 mg | ORAL_TABLET | Freq: Every day | ORAL | 1 refills | Status: DC | PRN

## 2019-07-14 MED ORDER — IPRATROPIUM-ALBUTEROL 0.5-2.5 (3) MG/3ML IN SOLN
3.0000 mL | Freq: Once | RESPIRATORY_TRACT | Status: AC
  Administered 2019-07-14: 17:00:00 3 mL via RESPIRATORY_TRACT

## 2019-07-14 MED ORDER — BUPIVACAINE HCL 0.5 % IJ SOLN
INTRAMUSCULAR | Status: DC | PRN
  Administered 2019-07-14: 20 mL

## 2019-07-14 MED ORDER — CEFAZOLIN SODIUM-DEXTROSE 2-4 GM/100ML-% IV SOLN
INTRAVENOUS | Status: AC
  Filled 2019-07-14: qty 100

## 2019-07-14 MED ORDER — PROPOFOL 10 MG/ML IV BOLUS
INTRAVENOUS | Status: DC | PRN
  Administered 2019-07-14: 50 mg via INTRAVENOUS

## 2019-07-14 MED ORDER — CEFAZOLIN SODIUM-DEXTROSE 2-4 GM/100ML-% IV SOLN
2.0000 g | INTRAVENOUS | Status: AC
  Administered 2019-07-14: 16:00:00 2 g via INTRAVENOUS

## 2019-07-14 MED ORDER — OXYCODONE-ACETAMINOPHEN 7.5-325 MG PO TABS: 1.0000 | ORAL_TABLET | ORAL | 0 refills | Status: DC | PRN

## 2019-07-14 MED ORDER — BUPIVACAINE HCL (PF) 0.5 % IJ SOLN
INTRAMUSCULAR | Status: AC
  Filled 2019-07-14: qty 30

## 2019-07-14 MED ORDER — NEOMYCIN-POLYMYXIN B GU 40-200000 IR SOLN
Status: DC | PRN
  Administered 2019-07-14: 2 mL

## 2019-07-14 MED ORDER — ENOXAPARIN SODIUM 40 MG/0.4ML ~~LOC~~ SOLN: 40.0000 mg | SUBCUTANEOUS | 0 refills | Status: DC

## 2019-07-14 MED ORDER — DEXMEDETOMIDINE HCL 200 MCG/2ML IV SOLN
INTRAVENOUS | Status: DC | PRN
  Administered 2019-07-14 (×2): 8 ug via INTRAVENOUS
  Administered 2019-07-14: 4 ug via INTRAVENOUS

## 2019-07-14 MED ORDER — DEXMEDETOMIDINE HCL IN NACL 80 MCG/20ML IV SOLN
INTRAVENOUS | Status: AC
  Filled 2019-07-14: qty 20

## 2019-07-14 MED ORDER — IPRATROPIUM-ALBUTEROL 0.5-2.5 (3) MG/3ML IN SOLN
RESPIRATORY_TRACT | Status: AC
  Filled 2019-07-14: qty 3

## 2019-07-14 MED ORDER — LACTATED RINGERS IV SOLN: INTRAVENOUS | Status: DC

## 2019-07-14 MED ORDER — IPRATROPIUM-ALBUTEROL 0.5-2.5 (3) MG/3ML IN SOLN
RESPIRATORY_TRACT | Status: AC
  Administered 2019-07-14: 3 mL via RESPIRATORY_TRACT
  Filled 2019-07-14: qty 3

## 2019-07-14 MED ORDER — GLYCOPYRROLATE 0.2 MG/ML IJ SOLN
INTRAMUSCULAR | Status: DC | PRN
  Administered 2019-07-14: .2 mg via INTRAVENOUS

## 2019-07-14 MED ORDER — ONDANSETRON HCL 4 MG PO TABS: 4.0000 mg | ORAL_TABLET | Freq: Three times a day (TID) | ORAL | 0 refills | Status: AC | PRN

## 2019-07-14 SURGICAL SUPPLY — 48 items
BLADE MED AGGRESSIVE (BLADE) ×3 IMPLANT
BLADE OSC/SAGITTAL MD 5.5X18 (BLADE) IMPLANT
BLADE SURG 15 STRL LF DISP TIS (BLADE) IMPLANT
BLADE SURG 15 STRL SS (BLADE)
BLADE SURG MINI STRL (BLADE) IMPLANT
BNDG COHESIVE 4X5 TAN STRL (GAUZE/BANDAGES/DRESSINGS) ×2 IMPLANT
BNDG ELASTIC 4X5.8 VLCR STR LF (GAUZE/BANDAGES/DRESSINGS) ×1 IMPLANT
BNDG ESMARK 4X12 TAN STRL LF (GAUZE/BANDAGES/DRESSINGS) ×3 IMPLANT
BNDG GAUZE 4.5X4.1 6PLY STRL (MISCELLANEOUS) ×5 IMPLANT
CANISTER SUCT 1200ML W/VALVE (MISCELLANEOUS) ×3 IMPLANT
CNTNR SPEC 2.5X3XGRAD LEK (MISCELLANEOUS) ×2
CONT SPEC 4OZ STER OR WHT (MISCELLANEOUS) ×4
CONTAINER SPEC 2.5X3XGRAD LEK (MISCELLANEOUS) ×1 IMPLANT
COVER WAND RF STERILE (DRAPES) ×3 IMPLANT
CUFF TOURN 18 STER (MISCELLANEOUS) IMPLANT
CUFF TOURN DUAL PL 12 NO SLV (MISCELLANEOUS) ×2 IMPLANT
DRAPE FLUOR MINI C-ARM 54X84 (DRAPES) IMPLANT
DURAPREP 26ML APPLICATOR (WOUND CARE) ×3 IMPLANT
ELECT REM PT RETURN 9FT ADLT (ELECTROSURGICAL) ×3
ELECTRODE REM PT RTRN 9FT ADLT (ELECTROSURGICAL) ×1 IMPLANT
GAUZE SPONGE 4X4 12PLY STRL (GAUZE/BANDAGES/DRESSINGS) ×6 IMPLANT
GAUZE XEROFORM 1X8 LF (GAUZE/BANDAGES/DRESSINGS) ×3 IMPLANT
GLOVE BIO SURGEON STRL SZ7 (GLOVE) ×3 IMPLANT
GLOVE INDICATOR 7.0 STRL GRN (GLOVE) ×3 IMPLANT
GOWN STRL REUS W/ TWL LRG LVL3 (GOWN DISPOSABLE) ×2 IMPLANT
GOWN STRL REUS W/TWL LRG LVL3 (GOWN DISPOSABLE) ×4
KIT TURNOVER KIT A (KITS) ×3 IMPLANT
LABEL OR SOLS (LABEL) ×3 IMPLANT
NDL FILTER BLUNT 18X1 1/2 (NEEDLE) ×1 IMPLANT
NDL HYPO 25X1 1.5 SAFETY (NEEDLE) ×2 IMPLANT
NEEDLE FILTER BLUNT 18X 1/2SAF (NEEDLE) ×2
NEEDLE FILTER BLUNT 18X1 1/2 (NEEDLE) ×1 IMPLANT
NEEDLE HYPO 25X1 1.5 SAFETY (NEEDLE) ×6 IMPLANT
NS IRRIG 500ML POUR BTL (IV SOLUTION) ×3 IMPLANT
PACK EXTREMITY ARMC (MISCELLANEOUS) ×3 IMPLANT
PADDING CAST 3IN STRL (MISCELLANEOUS) ×2
PADDING CAST BLEND 3X4 STRL (MISCELLANEOUS) IMPLANT
SOL .9 NS 3000ML IRR  AL (IV SOLUTION)
SOL .9 NS 3000ML IRR UROMATIC (IV SOLUTION) IMPLANT
SOL PREP PVP 2OZ (MISCELLANEOUS) ×3
SOLUTION PREP PVP 2OZ (MISCELLANEOUS) ×1 IMPLANT
STOCKINETTE STRL 6IN 960660 (GAUZE/BANDAGES/DRESSINGS) ×3 IMPLANT
SUT ETHILON 3-0 FS-10 30 BLK (SUTURE) ×6
SUT VIC AB 3-0 SH 27 (SUTURE) ×2
SUT VIC AB 3-0 SH 27X BRD (SUTURE) ×1 IMPLANT
SUTURE EHLN 3-0 FS-10 30 BLK (SUTURE) ×2 IMPLANT
SWAB CULTURE AMIES ANAERIB BLU (MISCELLANEOUS) IMPLANT
SYR 10ML LL (SYRINGE) ×3 IMPLANT

## 2019-07-14 NOTE — Op Note (Addendum)
PODIATRY / FOOT AND ANKLE SURGERY OPERATIVE REPORT    SURGEON: Caroline More, DPM  PRE-OPERATIVE DIAGNOSIS:  1.  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  Right foot cellulitis 3.  Diabetes type 2 polyneuropathy 4.  PVD 5.  COPD with history of tobacco abuse  POST-OPERATIVE DIAGNOSIS: Same  PROCEDURE(S): 1. Right partial fifth ray amputation with rotational skin flap advancement <10sqcm  HEMOSTASIS: Right ankle tourniquet  ANESTHESIA: MAC  ESTIMATED BLOOD LOSS: 10 cc  FINDING(S): 1.  Osteomyelitis present at the right fifth metatarsal phalangeal joint  PATHOLOGY/SPECIMEN(S): Right partial fifth ray pathologic specimen, right fifth metatarsal head bone culture  INDICATIONS:   Christopher Mendez is a 69 y.o. male who presents with chronic nonhealing ulceration to the plantar aspect of the right fifth metatarsal phalangeal joint.  Patient was seen in the wound care center [facility].  A consultation/referral was placed to podiatry for further evaluation and potential surgical work-up.  Prior to visit patient had an MRI and x-ray imaging showing osteomyelitic changes at the level of the right fifth metatarsal phalangeal joint.  Discussed all treatment options with the patient both conservative and surgical attempts at correction including potential risks and complications of surgical intervention at this time the patient is elected for surgical procedure consisting of right partial fifth ray amputation.  Dopplers were performed preoperatively showing biphasic to triphasic flow at the dorsalis pedis and posterior tibial arteries.  Discussed smoking cessation in detail with the patient.  Patient has elected for the procedure.  Discussed postoperative course in detail including a period of partial to nonweightbearing for approximately 2 to 3 weeks until the incision sites healed.  Patient  understands would like to proceed.  DESCRIPTION: After obtaining full informed written consent, the patient was brought back to the operating room and placed supine upon the operating table.  The patient received IV antibiotics prior to induction.  After obtaining adequate anesthesia 20 cc of half percent Marcaine plain was injected about the right fifth ray in a reverse Mayo type block fashion. The patient was prepped and draped in the standard fashion.  An Esmarch bandage was used to exsanguinate the right lower extremity and pneumatic ankle tourniquet was inflated.  Attention was then directed to the right fifth ray. Preop wound measurement was 1.9 x 1.2 x 0.6cm to bone. An incision was drawn out to excise the plantar ulceration and an elliptical type manner with a proximal arm extending from the plantar distal fifth metatarsal to the lateral dorsal fifth metatarsal midshaft extending from the proximal ulceration site.  Distally at the distal edge of the ulceration site and elliptical type incision placement was performed around the fifth digit at the base the proximal phalanx.  After the incision was drawn out the incision was performed straight to bone.  The ulceration was ellipsed and excised and passed off the operative site.  The fifth metatarsal phalangeal joint was then identified and an extensor tenotomy and capsulotomy was performed followed by release the collateral and suspensory ligaments as well as the plantar plate and the attachment to the flexor hallucis longus tendon.  The fifth toe was disarticulated and passed off the operative site.  The fifth metatarsal head appeared to be severely degenerated in nature and appeared to be gray consistent with signs of osteomyelitis.  It also appeared to be very soft.  A rondure was taken and a bone culture was taken of this and  passed off the operative site.  Circumferential dissection was then performed around the fifth metatarsal head neck and distal  shaft to the level of the midshaft creating again full-thickness flaps dorsally and plantarly.  A sagittal bone saw was then used to resect the distal to mid shaft portion of the fifth metatarsal with the appropriate beveling leaving more dorsal and medial and plantar lateral.  The distal fifth metatarsal was then dissected free from any attachments and passed off in the operative site.  The proximal margin was marked in purple ink from the resected bone.  This was then passed off as pathologic specimen with the rest of the toe and metatarsal.  The surgical site was flushed with copious amounts normal sterile saline.  The large dorsal flap that was created was then fitted to size to fill the void from the previous ulceration.  This appeared to fit well but had to be remodeled somewhat to remove some mild dogear formations that were formed.  The flaps appeared to fit fairly well overall and the flap was advanced and rotated to fit into position.  This flap in itself was less than 10cm and was approximately advanced 2-3 cm laterally and plantarly.  The deep structures then reapproximated well coapted with 3-0 Vicryl.  The subcutaneous tissue was then reapproximated well coapted while holding the flap in a reduced position advancing it again laterally and plantarly to fill the plantar wound void with 3-0 Vicryl.  The skin was then reapproximated well coapted with 3-0 nylon in a combination of simple and horizontal mattress type stitching.  The skin edges appear to be well everted and well coapted at the operative site.  An additional 10 cc of half percent Marcaine plain was injected about the operative site.  The pneumatic ankle tourniquet was deflated and a prompt hyperemic response was noted all digits of right foot.  A postoperative dressing was applied consisting of Xeroform followed by 4 x 4 gauze, ABD, Kerlix, Coban.  The patient tolerated the procedure and anesthesia well was transferred to recovery room  vital signs stable vascular status intact all toes the right foot.  Following.  Postoperative monitoring the patient be discharged home the following written oral postop instructions: Keep surgical dressings clean, dry, and intact, elevate right lower extremity when at rest, try to maintain nonweightbearing at all times to the right foot -okay to put a small amount of pressure on the heel but do not walk for long periods of time or stand on the foot for a long time, take postoperative pain medication, antibiotics, and antinausea medication as prescribed, work on knee flexion extension exercises as well as calf massages daily and take Lovenox therapy until able to ambulate again.  Patient to follow-up within 1 week of surgical date  COMPLICATIONS: None  CONDITION: Good, stable  Caroline More, DPM

## 2019-07-14 NOTE — Progress Notes (Signed)
Christopher, Mendez ([id number]) Visit Report for 06/21/2019 Debridement Details Patient Name: Christopher, Mendez. Date of Service: 06/21/2019 8:15 AM Medical Record Number: [id number] Patient Account Number: [id number] Date of Birth/Sex: 12-Mar-1951 (69 y.o. M) Treating RN: Cornell Barman Primary Care Provider: Beverlyn Roux Other Clinician: Referring Provider: Beverlyn Roux Treating Provider/Extender: Tito Dine in Treatment: 2 Debridement Performed for Wound #1 Right,Lateral,Plantar Foot Assessment: Performed By: Physician Ricard Dillon, MD Debridement Type: Debridement Level of Consciousness (Pre- Awake and Alert procedure): Pre-procedure Verification/Time Yes - 08:40 Out Taken: Start Time: 08:40 Pain Control: Lidocaine Total Area Debrided (L x W): 1 (cm) x 1.6 (cm) = 1.6 (cm) Tissue and other material Viable, Non-Viable, Slough, Subcutaneous, Skin: Dermis , Slough debrided: Level: Skin/Subcutaneous Tissue Debridement Description: Excisional Instrument: Curette Bleeding: Minimum Hemostasis Achieved: Pressure End Time: 08:45 Response to Treatment: Procedure was tolerated well Level of Consciousness Awake and Alert (Post-procedure): Post Debridement Measurements of Total Wound Length: (cm) 1 Stage: Category/Stage IV Width: (cm) 1.6 Depth: (cm) 0.9 Volume: (cm) 1.131 Character of Wound/Ulcer Post Requires Further Debridement Debridement: Post Procedure Diagnosis Same as Pre-procedure Electronic Signature(s) Signed: 06/21/2019 4:18:13 PM By: Linton Ham MD Signed: 07/14/2019 11:50:22 AM By: Gretta Cool, BSN, RN, CWS, Kim RN, BSN Entered By: Linton Ham on 06/21/2019 08:47:33 Christopher Mendez ([id number]) -------------------------------------------------------------------------------- HPI Details Patient Name: Christopher, Mendez. Date of Service: 06/21/2019 8:15 AM Medical Record Number: [id number] Patient Account Number: [id number] Date of Birth/Sex:  [birth year] (69 y.o. M) Treating RN: Cornell Barman Primary Care Provider: Beverlyn Roux Other Clinician: Referring Provider: Beverlyn Roux Treating Provider/Extender: Tito Dine in Treatment: 2 History of Present Illness HPI Description: ADMISSION 06/07/2019 This is a 69 year old nondiabetic man. He is here accompanied by his wife. He tells me that he had a callus on the right lateral foot over roughly the fifth metatarsal head plantar aspect which he never really bothered about because it did not really bother him. Apparently 2 weeks ago the callus split open and fell off leaving him with a very deep sore with considerable depth. He is already been seen and by his primary physician in the [facility]. Apparently a culture was done and he was prescribed Levaquin for 10 days. We do not have the culture result. Past medical history; prostate CA for which she received radiation and now androgen depletion therapy, COPD, cardiac enlargement. He is a heavy smoker. ABIs in our clinic were 1.03 1/20; this is a nondiabetic man who came in with a deep probing wound over the right lateral foot over the fifth metatarsal head. He says he had a large callus in this area that eventually split off but he was left with a deep wound in approximately 50% of an otherwise dime shaped wound. This went all the way down to periosteum. He was given Levaquin by his primary doctor. 1/27; deep probing wound over the right lateral foot over the fifth metatarsal head. In general the wound surface looks better. Still a deep probing center part of this wound. We have been using silver collagen Electronic Signature(s) Signed: 06/21/2019 4:18:13 PM By: Linton Ham MD Entered By: Linton Ham on 06/21/2019 08:48:26 Christopher Mendez ([id number]) -------------------------------------------------------------------------------- Physical Exam Details Patient Name: Christopher, Mendez. Date of Service: 06/21/2019  8:15 AM Medical Record Number: [id number] Patient Account Number: [id number] Date of Birth/Sex: 1950/12/08 (69 y.o. M) Treating RN: Cornell Barman Primary Care Provider: Beverlyn Roux Other Clinician: Referring Provider: Beverlyn Roux Treating Provider/Extender: Tito Dine in Treatment: 2  Constitutional Sitting or standing Blood Pressure is within target range for patient.. Pulse regular and within target range for patient.Marland Kitchen Respirations regular, non-labored and within target range.. Temperature is normal and within the target range for the patient.Marland Kitchen appears in no distress. Cardiovascular Pedal pulses are palpable. Notes Wound exam; the area questions over the plantar left fifth metatarsal head. This is a deep wound but I think has more granulation. There is no overt infection no purulence. Still necrotic debris in the center of the wound that requires debridement. I used a #5 curette I also removed some skin and undermining more superficially. Electronic Signature(s) Signed: 06/21/2019 4:18:13 PM By: Linton Ham MD Entered By: Linton Ham on 06/21/2019 08:51:03 Christopher Mendez ([id number]) -------------------------------------------------------------------------------- Physician Orders Details Patient Name: Christopher, Mendez. Date of Service: 06/21/2019 8:15 AM Medical Record Number: [id number] Patient Account Number: [id number] Date of Birth/Sex: [birth year] (69 y.o. M) Treating RN: Cornell Barman Primary Care Provider: Beverlyn Roux Other Clinician: Referring Provider: Beverlyn Roux Treating Provider/Extender: Tito Dine in Treatment: 2 Verbal / Phone Orders: No Diagnosis Coding Wound Cleansing o Clean wound with Normal Saline. o May Shower, gently pat wound dry prior to applying new dressing. Anesthetic (add to Medication List) Wound #1 Right,Lateral,Plantar Foot o Topical Lidocaine 4% cream applied to wound bed prior to debridement (In Clinic  Only). Primary Wound Dressing Wound #1 Right,Lateral,Plantar Foot o Silver Collagen - moistened with saline Secondary Dressing Wound #1 Right,Lateral,Plantar Foot o ABD and Kerlix/Conform Dressing Change Frequency Wound #1 Right,Lateral,Plantar Foot o Change dressing every other day. Follow-up Appointments Wound #1 Right,Lateral,Plantar Foot o Return Appointment in 1 week. Edema Control Wound #1 Right,Lateral,Plantar Foot o Elevate legs to the level of the heart and pump ankles as often as possible Off-Loading Wound #1 Right,Lateral,Plantar Foot o Other: [id number] loader Additional Orders / Instructions Wound #1 Right,Lateral,Plantar Foot o Stop Smoking o Increase protein intake. o Other: - Stay off of feet as much as possible. Elevate when sitting. Electronic Signature(s) Signed: 06/21/2019 4:18:13 PM By: Linton Ham MD Christopher Mendez ([id number]) Signed: 07/14/2019 11:50:22 AM By: Gretta Cool, BSN, RN, CWS, Kim RN, BSN Entered By: Gretta Cool, BSN, RN, CWS, Kim on 06/21/2019 08:44:21 EKIN, BARDO ([id number]) -------------------------------------------------------------------------------- Problem List Details Patient Name: Christopher, Mendez. Date of Service: 06/21/2019 8:15 AM Medical Record Number: [id number] Patient Account Number: [id number] Date of Birth/Sex: 1950-12-22 (69 y.o. 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 Date of Service: 06/21/2019 8:15 AM Medical Record Number: [id number] Patient Account Number: [id number] Date of Birth/Sex: 12-25-1950 (69 y.o. M) Treating RN: Cornell Barman Primary Care Provider: Beverlyn Roux Other Clinician: Referring Provider: Beverlyn Roux Treating Provider/Extender: Tito Dine in Treatment: 2 Subjective History of Present Illness (HPI) ADMISSION 06/07/2019 This is a 69 year old nondiabetic man. He is here accompanied by his wife. He tells me that he had a callus on the right lateral foot over roughly the fifth metatarsal head plantar aspect which he never really bothered about  because it did not really bother him. Apparently 2 weeks ago the callus split open and fell off leaving him with a very deep sore with considerable depth. He is already been seen and by his primary physician in the [facility]. Apparently a culture was done and he was prescribed Levaquin for 10 days. We do not have the culture result. Past medical history; prostate CA for which she received radiation and now androgen depletion therapy, COPD, cardiac enlargement. He is a heavy smoker. ABIs in our clinic were 1.03 1/20; this is a nondiabetic man who came in with a deep probing wound over the right lateral foot over the fifth metatarsal head. He says he had a large callus in this area that eventually split off but he was left with a deep wound in approximately 50% of an otherwise dime shaped wound. This went all the way down to periosteum. He was given Levaquin by his primary doctor. 1/27; deep probing wound over the right lateral foot over the fifth metatarsal head. In general the wound surface looks better. Still a deep probing center part of this wound. We have been using silver  collagen Objective Constitutional Sitting or standing Blood Pressure is within target range for patient.. Pulse regular and within target range for patient.Marland Kitchen Respirations regular, non-labored and within target range.. Temperature is normal and within the target range for the patient.Marland Kitchen appears in no distress. Vitals Time Taken: 8:18 AM, Height: 73 in, Weight: 238 lbs, BMI: 31.4, Temperature: 98.4 F, Pulse: 73 bpm, Respiratory Rate: 16 breaths/min, Blood Pressure: 127/51 mmHg. Cardiovascular Pedal pulses are palpable. General Notes: Wound exam; the area questions over the plantar left fifth metatarsal head. This is a deep wound but I think has more granulation. There is no overt infection no purulence. Still necrotic debris in the center of the wound that requires Christopher, Mendez. ([id number]) debridement. I used a #5 curette I also removed some skin and undermining more superficially. Integumentary (Hair, Skin) Wound #1 status is Open. Original cause of wound was Blister. The wound is located on the [place]. The wound measures 1cm length x 1.6cm width x 0.8cm depth; 1.257cm^2 area and 1.005cm^3 volume. There is bone, tendon, and Fat Layer (Subcutaneous Tissue) Exposed exposed. There is no tunneling noted, however, there is undermining starting at 11:00 and ending at 5:00 with a maximum distance of 1.7cm. There is a medium amount of serous drainage noted. The wound margin is flat and intact. There is small (1-33%) pink granulation within the wound bed. There is a large (67-100%) amount of necrotic tissue within the wound bed including Adherent Slough. Assessment Active Problems ICD-10 Non-pressure chronic ulcer of other part of right foot with necrosis of bone Cellulitis of right lower limb Procedures Wound #1 Pre-procedure diagnosis of Wound #1 is a Pressure Ulcer located on the Right,Lateral,Plantar Foot . There was a Excisional Skin/Subcutaneous Tissue Debridement  with a total area of 1.6 sq cm performed by Ricard Dillon, MD. With the following instrument(s): Curette to remove Viable and Non-Viable tissue/material. Material removed includes Subcutaneous Tissue, Slough, and Skin: Dermis after achieving pain control using Lidocaine. No specimens were taken. A time out was conducted at 08:40, prior to the start of the procedure. A Minimum amount of bleeding was controlled with Pressure. The procedure was tolerated well. Post Debridement Measurements: 1cm length x 1.6cm width x 0.9cm depth; 1.131cm^3 volume. Post debridement Stage noted as Category/Stage IV. Character of Wound/Ulcer Post Debridement requires further debridement. Post procedure Diagnosis  Wound #1: Same as Pre-Procedure Plan Wound Cleansing: Clean wound with Normal Saline. May Shower, gently pat wound dry prior to applying new dressing. Anesthetic (add to Medication List): Wound #1 Right,Lateral,Plantar Foot: Topical Lidocaine 4% cream applied to wound bed prior to debridement (In Clinic Only). Primary Wound Dressing: Wound #1 Right,Lateral,Plantar Foot: Silver Collagen - moistened with saline Secondary Dressing: Wound #1 Right,Lateral,Plantar Foot: ABD and Kerlix/Conform Christopher, Mendez. ([id number]) Dressing Change Frequency: Wound #1 Right,Lateral,Plantar Foot: Change dressing every other day. Follow-up Appointments: Wound #1 Right,Lateral,Plantar Foot: Return Appointment in 1 week. Edema Control: Wound #1 Right,Lateral,Plantar Foot: Elevate legs to the level of the heart and pump ankles as often as possible Off-Loading: Wound #1 Right,Lateral,Plantar Foot: Other: [id number] loader Additional Orders / Instructions: Wound #1 Right,Lateral,Plantar Foot: Stop Smoking Increase protein intake. Other: - Stay off of feet as much as possible. Elevate when sitting. 1. Continue with silver collagen 2. He completed a course of Levaquin that was given to him by his primary  physician before he came into this clinic. To date I have not really felt the need to provide additional antibiotics. Plain x-ray was negative 3. There is been improvement in the granulation in general condition of the wound surface. For now no additional imaging. 4. I may put him in a total contact cast next week Electronic Signature(s) Signed: 06/21/2019 4:18:13 PM By: Linton Ham MD Entered By: Linton Ham on 06/21/2019 08:52:02 Christopher Mendez ([id number]) -------------------------------------------------------------------------------- SuperBill Details Patient Name: Christopher, Mendez. Date of Service: 06/21/2019 Medical Record Number: [id number] Patient Account Number: [id number] Date of Birth/Sex: 01-22-1951 (69 y.o. M) Treating RN: Cornell Barman Primary Care Provider: Beverlyn Roux Other Clinician: Referring Provider: Beverlyn Roux Treating Provider/Extender: Tito Dine in Treatment: 2 Diagnosis Coding ICD-10 Codes Code Description [phone] Non-pressure chronic ulcer of other part of right foot with necrosis of bone L03.115 Cellulitis of right lower limb Facility Procedures CPT4 Code: [id number] Description: [id number] - DEB SUBQ TISSUE 20 SQ CM/< ICD-10 Diagnosis Description L97.514 Non-pressure chronic ulcer of other part of right foot with n Modifier: ecrosis of bone Quantity: 1 Physician Procedures CPT4 Code: [id number] Description: [id number] - WC PHYS SUBQ TISS 20 SQ CM ICD-10 Diagnosis Description L97.514 Non-pressure chronic ulcer of other part of right foot with ne Modifier: crosis of bone Quantity: 1 Electronic Signature(s) Signed: 06/21/2019 4:18:13 PM By: Linton Ham MD Entered By: Linton Ham on 06/21/2019 08:52:15

## 2019-07-14 NOTE — Discharge Instructions (Signed)
[place] DR. TROXLER, DR. Vickki Muff, AND DR. Herminie   1. Take your medication as prescribed.  Pain medication should be taken only as needed.  2. Keep the dressing clean, dry and intact.  3. Keep your foot elevated above the heart level for the first 48 hours.  4. Walking to the bathroom and brief periods of walking are acceptable, unless we have instructed you to be non-weight bearing.  5. Always wear your post-op shoe when walking.  Always use your crutches if you are to be non-weight bearing.  6. Do not take a shower. Baths are permissible as long as the foot is kept out of the water.   7. Every hour you are awake:  - Bend your knee 15 times. - Flex foot 15 times - Massage calf 15 times  8. Call [facility] [phone]) if any of the following problems occur: - You develop a temperature or fever. - The bandage becomes saturated with blood. - Medication does not stop your pain. - Injury of the foot occurs. - Any symptoms of infection including redness, odor, or red streaks running from wound.     AMBULATORY SURGERY  DISCHARGE INSTRUCTIONS   1) The drugs that you were given will stay in your system until tomorrow so for the next 24 hours you should not:  A) Drive an automobile B) Make any legal decisions C) Drink any alcoholic beverage   2) You may resume regular meals tomorrow.  Today it is better to start with liquids and gradually work up to solid foods.  You may eat anything you prefer, but it is better to start with liquids, then soup and crackers, and gradually work up to solid foods.   3) Please notify your doctor immediately if you have any unusual bleeding, trouble breathing, redness and pain at the surgery site, drainage, fever, or pain not relieved by medication.    4) Additional Instructions: Please start Lovenox first thing in  morning.  Complete prescriptions of metronidazole and levofloxacin before starting doxycycline.  Please use Norco/Vicodin that you have at home until you can get prescription for Percocet filled in the morning then discontinue Norco/Vicodin .        Please contact your physician with any problems or Same Day Surgery at [phone], Monday through Friday 6 am to 4 pm, or [place] at [facility] number at [phone]. -

## 2019-07-14 NOTE — Progress Notes (Signed)
Christopher Mendez, Christopher Mendez ([id number]) Visit Report for 06/21/2019 Arrival Information Details Patient Name: Christopher Mendez, Christopher Mendez. Date of Service: 06/21/2019 8:15 AM Medical Record Number: [id number] Patient Account Number: [id number] Date of Birth/Sex: 1950-07-10 (69 y.o. M) Treating Mendez: Christopher Mendez Primary Care Christopher Mendez: Christopher Mendez Other Clinician: Referring Christopher Mendez: Christopher Mendez Treating Christopher Mendez/Extender: Christopher Mendez Christopher Mendez: 2 Visit Information History Since Last Visit Added or deleted any medications: No Patient Arrived: Ambulatory Any new allergies or adverse reactions: No Arrival Time: 08:17 Had a fall or experienced change in No Accompanied By: wife activities of daily living that may affect Transfer Assistance: None risk of falls: Patient Identification Verified: Yes Signs or symptoms of abuse/neglect since last visito No Secondary Verification Process Completed: Yes Hospitalized since last visit: No Implantable device outside of the clinic excluding No cellular tissue based products placed in the center since last visit: Has Dressing in Place as Prescribed: Yes Has Footwear/Offloading in Place as Prescribed: Yes Right: Wedge Shoe Pain Present Now: No Electronic Signature(s) Signed: 07/14/2019 11:50:22 AM By: Christopher Mendez Entered By: Christopher Mendez, BSN, Mendez, CWS, Christopher on 06/21/2019 08:18:21 Christopher Mendez ([id number]) -------------------------------------------------------------------------------- Encounter Discharge Information Details Patient Name: Christopher Mendez, Christopher Mendez. Date of Service: 06/21/2019 8:15 AM Medical Record Number: [id number] Patient Account Number: [id number] Date of Birth/Sex: [birth year] (69 y.o. M) Treating Mendez: Christopher Mendez Primary Care Christopher Mendez: Christopher Mendez Other Clinician: Referring Abigaelle Verley: Christopher Mendez Treating Jarica Plass/Extender: Christopher Mendez Christopher Mendez: 2 Encounter Discharge Information Items Post Procedure  Vitals Discharge Condition: Stable Temperature (F): 98.4 Ambulatory Status: Ambulatory Pulse (bpm): 73 Discharge Destination: Home Respiratory Rate (breaths/min): 16 Transportation: Private Auto Blood Pressure (mmHg): 127/51 Accompanied By: wife Schedule Follow-up Appointment: Yes Clinical Summary of Care: Electronic Signature(s) Signed: 07/14/2019 11:50:22 AM By: Christopher Mendez Entered By: Christopher Mendez, BSN, Mendez, CWS, Christopher on 06/21/2019 08:55:15 Christopher Mendez ([id number]) -------------------------------------------------------------------------------- Lower Extremity Assessment Details Patient Name: Christopher Mendez, Christopher Mendez. Date of Service: 06/21/2019 8:15 AM Medical Record Number: [id number] Patient Account Number: [id number] Date of Birth/Sex: November 26, 1950 (69 y.o. M) Treating Mendez: Christopher Mendez Primary Care Christopher Mendez: Christopher Mendez Other Clinician: Referring Jala Dundon: Christopher Mendez Treating Christopher Mendez/Extender: Christopher Mendez Christopher Mendez: 2 Vascular Assessment Pulses: Dorsalis Pedis Palpable: [Right:Yes] Electronic Signature(s) Signed: 07/14/2019 11:50:22 AM By: Christopher Mendez Entered By: Christopher Mendez, BSN, Mendez, CWS, Christopher on 06/21/2019 08:27:27 Christopher Mendez ([id number]) -------------------------------------------------------------------------------- Multi Wound Chart Details Patient Name: Christopher Mendez, Christopher Mendez. Date of Service: 06/21/2019 8:15 AM Medical Record Number: [id number] Patient Account Number: [id number] Date of Birth/Sex: 09-Sep-1950 (69 y.o. M) Treating Mendez: Christopher Mendez Primary Care Christopher Mendez: Christopher Mendez Other Clinician: Referring Christopher Mendez: Christopher Mendez Treating Christopher Mendez/Extender: Christopher Mendez Christopher Mendez: 2 Vital Signs Height(in): 73 Pulse(bpm): 75 Weight(lbs): 238 Blood Pressure(mmHg): 127/51 Body Mass Index(BMI): 31 Temperature(F): 98.4 Respiratory Rate 16 (breaths/min): Photos: [N/A:N/A] Wound Location: Right Foot -  Plantar, Lateral N/A N/A Wounding Event: Blister N/A N/A Primary Etiology: Pressure Ulcer N/A N/A Comorbid History: Cataracts, Asthma, Chronic N/A N/A Obstructive Pulmonary Disease (COPD), Sleep Apnea, Hypertension, Received Radiation Date Acquired: 05/26/2019 N/A N/A Weeks of Mendez: 2 N/A N/A Wound Status: Open N/A N/A Pending Amputation on Yes N/A N/A Presentation: Measurements L x W x D 1x1.6x0.8 N/A N/A (cm) Area (cm) : 1.257 N/A N/A Volume (cm) : 1.005 N/A N/A % Reduction in Area: 55.50% N/A N/A % Reduction in Volume: 40.70% N/A N/A Starting Position 1 11 (o'clock): Ending Position  1 5 (o'clock): Maximum Distance 1 (cm): 1.7 Undermining: Yes N/A N/A Classification: Category/Stage IV N/A N/A Exudate Amount: Medium N/A N/A Exudate Type: Serous N/A N/A Exudate Color: amber N/A N/A [name], [name] ([id number]) Wound Margin: Flat and Intact N/A N/A Granulation Amount: Small (1-33%) N/A N/A Granulation Quality: Pink N/A N/A Necrotic Amount: Large (67-100%) N/A N/A Exposed Structures: Fat Layer (Subcutaneous N/A N/A Tissue) Exposed: Yes Tendon: Yes Bone: Yes Fascia: No Muscle: No Joint: No Epithelialization: None N/A N/A Debridement: Debridement - Excisional N/A N/A Pre-procedure 08:40 N/A N/A Verification/Time Out Taken: Pain Control: Lidocaine N/A N/A Tissue Debrided: Subcutaneous, Slough N/A N/A Level: Skin/Subcutaneous Tissue N/A N/A Debridement Area (sq cm): 1.6 N/A N/A Instrument: Curette N/A N/A Bleeding: Minimum N/A N/A Hemostasis Achieved: Pressure N/A N/A Debridement Mendez Procedure was tolerated well N/A N/A Response: Post Debridement 1x1.6x0.9 N/A N/A Measurements L x W x D (cm) Post Debridement Volume: 1.131 N/A N/A (cm) Post Debridement Stage: Category/Stage IV N/A N/A Procedures Performed: Debridement N/A N/A Mendez Notes Electronic Signature(s) Signed: 06/21/2019 4:18:13 PM By: Christopher Ham MD Entered By: Christopher Mendez on  06/21/2019 08:47:24 Christopher Mendez ([id number]) -------------------------------------------------------------------------------- [place] Details Patient Name: Christopher Mendez, Christopher Mendez. Date of Service: 06/21/2019 8:15 AM Medical Record Number: [id number] Patient Account Number: [id number] Date of Birth/Sex: 02/04/1951 (69 y.o. M) Treating Mendez: Christopher Mendez Primary Care Shirlee Whitmire: Christopher Mendez Other Clinician: Referring Yasmyn Bellisario: Christopher Mendez Treating Garet Hooton/Extender: Christopher Mendez Christopher Mendez: 2 Active Inactive Necrotic Tissue Nursing Diagnoses: Impaired tissue integrity related to necrotic/devitalized tissue Goals: Necrotic/devitalized tissue will be minimized in the wound bed Date Initiated: 06/07/2019 Target Resolution Date: 06/28/2019 Goal Status: Active Interventions: Assess patient pain level pre-, during and post procedure and prior to discharge Mendez Activities: Apply topical anesthetic as ordered : 06/07/2019 Notes: Orientation to the Wound Care Program Nursing Diagnoses: Knowledge deficit related to the wound healing center program Goals: Patient/caregiver will verbalize understanding of the [place] Date Initiated: 06/07/2019 Target Resolution Date: 06/28/2019 Goal Status: Active Interventions: Provide education on orientation to the wound center Notes: Pressure Nursing Diagnoses: Knowledge deficit related to causes and risk factors for pressure ulcer development Goals: Patient will remain free of pressure ulcers Date Initiated: 06/07/2019 Target Resolution Date: 06/28/2019 Goal Status: Active Christopher Mendez, Christopher Mendez ([id number]) Interventions: Assess: immobility, friction, shearing, incontinence upon admission and as needed Notes: Soft Tissue Infection Nursing Diagnoses: Potential for infection: soft tissue Goals: Patient's soft tissue infection will resolve Date Initiated: 06/07/2019 Target Resolution Date:  06/28/2019 Goal Status: Active Interventions: Assess signs and symptoms of infection every visit Mendez Activities: Culture and sensitivity : 06/07/2019 Systemic antibiotics : 06/07/2019 Notes: Wound/Skin Impairment Nursing Diagnoses: Impaired tissue integrity Goals: Ulcer/skin breakdown will have a volume reduction of 30% by week 4 Date Initiated: 06/07/2019 Target Resolution Date: 07/08/2019 Goal Status: Active Interventions: Assess ulceration(s) every visit Mendez Activities: Skin care regimen initiated : 06/07/2019 Topical wound management initiated : 06/07/2019 Notes: Electronic Signature(s) Signed: 07/14/2019 11:50:22 AM By: Christopher Mendez Entered By: Christopher Mendez, BSN, Mendez, CWS, Christopher on 06/21/2019 08:27:30 Christopher Mendez ([id number]) -------------------------------------------------------------------------------- Pain Assessment Details Patient Name: Christopher Mendez, Christopher Mendez. Date of Service: 06/21/2019 8:15 AM Medical Record Number: [id number] Patient Account Number: [id number] Date of Birth/Sex: June 29, 1950 (69 y.o. M) Treating Mendez: Christopher Mendez Primary Care Hanni Milford: Christopher Mendez Other Clinician: Referring Opal Dinning: Christopher Mendez Treating Kalix Meinecke/Extender: Christopher Mendez Christopher Mendez: 2 Active Problems Location of Pain Severity and Description of Pain Patient Has Paino  No Site Locations Pain Management and Medication Current Pain Management: Notes Patient denies pain at this time. Electronic Signature(s) Signed: 07/14/2019 11:50:22 AM By: Christopher Mendez Entered By: Christopher Mendez, BSN, Mendez, CWS, Christopher on 06/21/2019 08:18:37 Christopher Mendez ([id number]) -------------------------------------------------------------------------------- Patient/Caregiver Education Details Patient Name: Christopher Mendez, Christopher Mendez. Date of Service: 06/21/2019 8:15 AM Medical Record Number: [id number] Patient Account Number: [id number] Date of Birth/Gender: 1951-02-17 (70 y.o.  M) Treating Mendez: Christopher Mendez Primary Care Physician: Christopher Mendez Other Clinician: Referring Physician: Beverlyn Mendez Treating Physician/Extender: Christopher Mendez Christopher Mendez: 2 Education Assessment Education Provided To: Patient Education Topics Provided Wound Debridement: Handouts: Wound Debridement Methods: Demonstration, Explain/Verbal Responses: State content correctly Wound/Skin Impairment: Handouts: Caring for Your Ulcer Methods: Demonstration, Explain/Verbal Responses: State content correctly Electronic Signature(s) Signed: 07/14/2019 11:50:22 AM By: Christopher Mendez Entered By: Christopher Mendez, BSN, Mendez, CWS, Christopher on 06/21/2019 08:45:27 Christopher Mendez ([id number]) -------------------------------------------------------------------------------- Wound Assessment Details Patient Name: Christopher Mendez, Christopher Mendez. Date of Service: 06/21/2019 8:15 AM Medical Record Number: [id number] Patient Account Number: [id number] Date of Birth/Sex: 05-09-1951 (69 y.o. M) Treating Mendez: Christopher Mendez Primary Care Kilee Hedding: Christopher Mendez Other Clinician: Referring Emmitt Matthews: Christopher Mendez Treating Ashad Fawbush/Extender: Christopher Mendez Christopher Mendez: 2 Wound Status Wound Number: 1 Primary Pressure Ulcer Etiology: Wound Location: Right Foot - Plantar, Lateral Wound Open Wounding Event: Blister Status: Date Acquired: 05/26/2019 Comorbid Cataracts, Asthma, Chronic Obstructive Weeks Of Mendez: 2 History: Pulmonary Disease (COPD), Sleep Apnea, Clustered Wound: No Hypertension, Received Radiation Pending Amputation On Presentation Photos Wound Measurements Length: (cm) 1 % Reduction Width: (cm) 1.6 % Reduction Depth: (cm) 0.8 Epitheliali Area: (cm) 1.257 Tunneling: Volume: (cm) 1.005 Underminin Starting Ending P Maximum in Area: 55.5% in Volume: 40.7% zation: None No g: Yes Position (o'clock): 11 osition (o'clock): 5 Distance: (cm) 1.7 Wound Description Classification:  Category/Stage IV Foul Odor A Wound Margin: Flat and Intact Slough/Fibr Exudate Amount: Medium Exudate Type: Serous Exudate Color: amber fter Cleansing: No ino Yes Wound Bed Granulation Amount: Small (1-33%) Exposed Structure Granulation Quality: Pink Fascia Exposed: No Necrotic Amount: Large (67-100%) Fat Layer (Subcutaneous Tissue) Exposed: Yes Necrotic Quality: Adherent Slough Tendon Exposed: Yes Muscle Exposed: No Christopher Mendez, Christopher Mendez ([id number]) Joint Exposed: No Bone Exposed: Yes Electronic Signature(s) Signed: 07/14/2019 11:50:22 AM By: Christopher Mendez Entered By: Christopher Mendez, BSN, Mendez, CWS, Christopher on 06/21/2019 08:27:09 Christopher Mendez ([id number]) -------------------------------------------------------------------------------- Vitals Details Patient Name: Christopher Mendez. Date of Service: 06/21/2019 8:15 AM Medical Record Number: [id number] Patient Account Number: [id number] Date of Birth/Sex: 1951-05-08 (69 y.o. M) Treating Mendez: Christopher Mendez Primary Care Kippy Melena: Christopher Mendez Other Clinician: Referring Deloma Spindle: Christopher Mendez Treating Jerald Villalona/Extender: Christopher Mendez Christopher Mendez: 2 Vital Signs Time Taken: 08:18 Temperature (F): 98.4 Height (in): 73 Pulse (bpm): 73 Weight (lbs): 238 Respiratory Rate (breaths/min): 16 Body Mass Index (BMI): 31.4 Blood Pressure (mmHg): 127/51 Reference Range: 80 - 120 mg / dl Electronic Signature(s) Signed: 07/14/2019 11:50:22 AM By: Christopher Mendez Entered By: Christopher Mendez, BSN, Mendez, CWS, Christopher on 06/21/2019 08:19:16

## 2019-07-14 NOTE — H&P (Addendum)
Podiatry H&P  Date of Service: 07/14/19  CC:      Chief Complaint  Patient presents with  .     RT FOOT 5TH TOE WOUND     Christopher Mendez is a 69 y.o. with a complaint of a wound to the lateral aspect of the right foot underneath the fifth metatarsal phalangeal joint area.  Patient presented as a consultation from wound care for potential surgical intervention.  Patient has been treated at wound care for the past 6 to 8 weeks.  He has had minimal improvement to his wound overall and an MRI was recently taken showing osteomyelitic changes to the right fifth metatarsal phalangeal joint area.  Patient was sent for further surgical evaluation.  Patient is unsure if he is diabetic or not but does have some numbness and tingling to his feet.  He overall appears to be a fairly poor historian.  He notes some pain to his right plantar foot and rates it at 2/10 today.  He states that he continues to take antibiotics that were prescribed from the wound care center.  He has noticed some increase in redness and swelling to the dorsum of the foot but it continues to be improved with antibiotic therapy.  He states that he would like to proceed with surgical intervention at this time if possible as he believes the IV antibiotics will likely not heal the infection.    PMH: No past medical history on file.  Medication:       Current Outpatient Medications on File Prior to Visit  Medication Sig Dispense Refill  . amLODIPine (NORVASC) 10 MG tablet Take by mouth    . fluticasone propion-salmeteroL (ADVAIR DISKUS) 500-50 mcg/dose diskus inhaler Inhale into the lungs    . hydroCHLOROthiazide (HYDRODIURIL) 25 MG tablet Take by mouth    . lisinopriL (ZESTRIL) 40 MG tablet Take by mouth    . multivitamin (MULTIVITAMIN) tablet Take by mouth    . tiotropium (SPIRIVA) 18 mcg inhalation capsule Place into inhaler and inhale    . triamcinolone 0.1 % cream Apply topically     No current  facility-administered medications on file prior to visit.     Allergies:    Allergies as of 07/10/2019  . (Not on File)    Surgical History: No past surgical history on file.  Social History: Social History  Social History        Socioeconomic History  . Marital status: Married    Spouse name: Not on file  . Number of children: Not on file  . Years of education: Not on file  . Highest education level: Not on file  Occupational History  . Not on file  Social Needs  . Financial resource strain: Not on file  . Food insecurity    Worry: Not on file    Inability: Not on file  . Transportation needs    Medical: Not on file    Non-medical: Not on file  Tobacco Use  . Smoking status: Not on file  Substance and Sexual Activity  . Alcohol use: Not on file  . Drug use: Not on file  . Sexual activity: Not on file  Lifestyle  . Physical activity    Days per week: Not on file    Minutes per session: Not on file  . Stress: Not on file  Relationships  . Social Herbalist on phone: Not on file    Gets together: Not on file  Attends religious service: Not on file    Active member of club or organization: Not on file    Attends meetings of clubs or organizations: Not on file    Relationship status: Not on file  Other Topics Concern  . Not on file  Social History Narrative  . Not on file     Social History      Tobacco Use  Smoking Status Not on file    Past Family History: No family history on file.   Review of Systems:  A comprehensive 14 point ROS was performed, reviewed, and the pertinent orthopaedic findings are documented in the HPI.  Objective: General/Constitutional: No apparent distress: well-nourished and well developed.  Psych: Normal mood and affect, oriented to person, place and time (AOx3)  CV: RRR, no murmur noted  Resp: Mild crackles bil, no labored breathing  Vascular: DP/PT pulses +1 bil,  biphasic Doppler DP and PT bilateral pulses, CFT intact to digits foot bil, hair growth to digits foot noted bil.  Mild erythema to the dorsal lateral aspect of the right forefoot and edema present that appears to be pitting, mild to moderate.  Neuro: Light touch sensation intact to digits bil  Derm: No open lesions or ulcerations noted BLE.  Skin appears to be thin and fairly atrophic to bilateral lower extremities.  Ulceration present to the plantar aspect of the right fifth metatarsal phalangeal joint which measures approximately 1.9 x 1.2 cm and probes to bone approximately 0.6 cm.  The wound bed appears to be fibrogranular with some small areas of necrosis and peripheral border of hyperkeratotic tissue present, no drainage, mild periwound erythema and edema present..  MSK: 5/5 strength to BLE MSK groups.  Right foot pain on palpation of the fifth metatarsal phalangeal joint area.  Xrays: None taken  Assessment:     Encounter Diagnoses  Name Primary?  . Other acute osteomyelitis of right foot (CMS-HCC) Yes  . Cellulitis of right foot   . 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 Right foot pain   . Mycotic toenails     Plan: -Pt was seen and examined. -Previous x-ray and MRI imaging reviewed.  Shows osteomyelitic changes to the right fifth metatarsal phalangeal joint. -Discussed all treatment options with the patient both conservative and surgical attempts at correction including potential risks and complications of surgical invention.  At this time patient is elected for surgery consisting of right partial fifth ray amputation.  Postoperative course as well as postoperative instructions described in detail.  Consent signed and obtained.  Discussed with patient the potential for wound healing complications and further bone infection as the main complications that could come forth with surgical intervention.  Patient understands would still like to  proceed. -Patient presents today for surgical intervention.

## 2019-07-14 NOTE — Anesthesia Procedure Notes (Signed)
Date/Time: 07/14/2019 4:11 PM Performed by: Nelda Marseille, CRNA Pre-anesthesia Checklist: Patient identified, Emergency Drugs available, Suction available, Patient being monitored and Timeout performed Oxygen Delivery Method: Simple face mask

## 2019-07-14 NOTE — Anesthesia Postprocedure Evaluation (Signed)
Anesthesia Post Note  Patient: Christopher Mendez  Procedure(s) Performed: AMPUTATION TOE RAY RIGHT 5TH PARTIAL (Right Toe)  Patient location during evaluation: PACU Anesthesia Type: General Level of consciousness: awake and alert Pain management: pain level controlled Vital Signs Assessment: post-procedure vital signs reviewed and stable Respiratory status: spontaneous breathing, nonlabored ventilation, respiratory function stable and patient connected to nasal cannula oxygen Cardiovascular status: blood pressure returned to baseline and stable Postop Assessment: no apparent nausea or vomiting Anesthetic complications: no     Last Vitals:  Vitals:   07/14/19 1937 07/14/19 1952  BP: (!) 143/71 130/61  Pulse:  71  Resp: (!) 24   Temp:  36.5 C  SpO2: 91% 92%    Last Pain:  Vitals:   07/14/19 1952  TempSrc:   PainSc: 0-No pain                 Martha Clan

## 2019-07-14 NOTE — Progress Notes (Signed)
Dr. Luana Shu notified pharmacy closed to pick up prescriptions. States patient may start Lovenox tomorrow morning instead of today . Also stated for patient to finish course of levaquin and metronidazole before starting doxycycline. Also stated may take norco until gets percocet filled. Then to stop norco.

## 2019-07-14 NOTE — Anesthesia Preprocedure Evaluation (Addendum)
Anesthesia Evaluation  Patient identified by MRN, date of birth, ID band Patient awake    Reviewed: Allergy & Precautions, H&P , NPO status , Patient's Chart, lab work & pertinent test results  Airway Mallampati: II  TM Distance: >3 FB Neck ROM: full    Dental  (+) Edentulous Lower, Edentulous Upper   Pulmonary COPD, Current Smoker,     + decreased breath sounds      Cardiovascular Exercise Tolerance: Poor hypertension, (-) angina(-) Past MI and (-) Cardiac Stents (-) dysrhythmias  Rhythm:regular Rate:Normal     Neuro/Psych negative neurological ROS  negative psych ROS   GI/Hepatic negative GI ROS, Neg liver ROS,   Endo/Other  negative endocrine ROS  Renal/GU negative Renal ROS  negative genitourinary   Musculoskeletal   Abdominal   Peds  Hematology negative hematology ROS (+)   Anesthesia Other Findings Past Medical History: No date: Cardiac enlargement No date: COPD (chronic obstructive pulmonary disease) (HCC) No date: HTN (hypertension)  Past Surgical History: No date: EYE SURGERY     Reproductive/Obstetrics negative OB ROS                           Anesthesia Physical Anesthesia Plan  ASA: III  Anesthesia Plan: General LMA   Post-op Pain Management:    Induction:   PONV Risk Score and Plan: Treatment may vary due to age or medical condition  Airway Management Planned:   Additional Equipment:   Intra-op Plan:   Post-operative Plan:   Informed Consent: I have reviewed the patients History and Physical, chart, labs and discussed the procedure including the risks, benefits and alternatives for the proposed anesthesia with the patient or authorized representative who has indicated his/her understanding and acceptance.     Dental Advisory Given  Plan Discussed with: Anesthesiologist  Anesthesia Plan Comments:        Anesthesia Quick Evaluation

## 2019-07-14 NOTE — Transfer of Care (Signed)
Immediate Anesthesia Transfer of Care Note  Patient: Christopher Mendez  Procedure(s) Performed: AMPUTATION TOE RAY RIGHT 5TH PARTIAL (Right Toe)  Patient Location: PACU  Anesthesia Type:General  Level of Consciousness: awake and alert   Airway & Oxygen Therapy: Patient Spontanous Breathing and Patient connected to face mask oxygen  Post-op Assessment: Report given to RN and Post -op Vital signs reviewed and stable  Post vital signs: Reviewed and stable  Last Vitals:  Vitals Value Taken Time  BP 101/44 07/14/19 1705  Temp 36.2 C 07/14/19 1705  Pulse 73 07/14/19 1706  Resp 22 07/14/19 1706  SpO2 97 % 07/14/19 1706  Vitals shown include unvalidated device data.  Last Pain:  Vitals:   07/14/19 1026  TempSrc: Temporal  PainSc: 0-No pain         Complications: No apparent anesthesia complications

## 2019-07-19 ENCOUNTER — Ambulatory Visit: Payer: Medicare Other | Admitting: Internal Medicine

## 2019-07-20 LAB — AEROBIC/ANAEROBIC CULTURE W GRAM STAIN (SURGICAL/DEEP WOUND): Gram Stain: NONE SEEN

## 2019-07-21 LAB — SURGICAL PATHOLOGY

## 2019-10-18 ENCOUNTER — Other Ambulatory Visit: Payer: Medicare Other

## 2019-10-18 ENCOUNTER — Other Ambulatory Visit: Payer: Self-pay

## 2019-10-18 DIAGNOSIS — C61 Malignant neoplasm of prostate: Secondary | ICD-10-CM

## 2019-10-19 LAB — PSA: Prostate Specific Ag, Serum: <0.1 ng/mL (ref 0.0–4.0)

## 2019-10-23 NOTE — Progress Notes (Signed)
10/24/19 8:56 PM   Christopher Mendez 05-06-1951 [id number]  Referring provider: Frazier Richards, [place],  [place] [address] Chief Complaint  Patient presents with  . Prostate Cancer    HPI: Christopher Mendez is a 69 y.o. 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  He initially presented with a markedly elevated PSA to 56.7 with a rockhard nodular prostate consistent with clinical prostate cancer. He also was in urinary retention unable to void spontaneously at the time.  He was started ondegerelixand underwent multiple failed voiding trials. Ultimately, he started on CIC and underwent prostate biopsy on 09/23/2017.  This revealed a 12 of 12 cores positive of high risk disease, Gleason 4+5 and 4+4 in all cores involving up to 100% of the tissue.  CT abdomen pelvis with contrast shows an irregular mass extending from the peripheral aspect of the prostate involving the adjacent base of the bladder. He also had an indeterminate sclerotic lesion of the femoral neck and L3 vertebral body. There is no significant pelvic adenopathy or any other concerning for metastatic disease. He underwent bone scan which was unremarkable.  He is now completed EBRT(completed 12/2017)and continues on ADT. He is due for six-month injection today.    He has been tolerating ADT without difficulty.  He has minimal to no hot flashes. He feels like his stream is good he is emptying well.  He has no urinary complaints today.  He has been doing weightbearing exercises.  He is taking a daily multivitamin unsure the doses of calcium and vitamin D he has been consuming.  PSA as of 10/18/19 is undetectable.  IPSS    Row Name 10/24/19 1300         International Prostate Symptom Score   How often have you had the sensation of not emptying your bladder?  Not at All     How  often have you had to urinate less than every two hours?  Less than half the time     How often have you found you stopped and started again several times when you urinated?  About half the time     How often have you found it difficult to postpone urination?  Not at All     How often have you had a weak urinary stream?  Not at All     How often have you had to strain to start urination?  Not at All     How many times did you typically get up at night to urinate?  4 Times     Total IPSS Score  9       Quality of Life due to urinary symptoms   If you were to spend the rest of your life with your urinary condition just the way it is now how would you feel about that?  Mixed        Score:  1-7 Mild 8-19 Moderate 20-35 Severe  PMH: Past Medical History:  Diagnosis Date  . Cardiac enlargement   . COPD (chronic obstructive pulmonary disease) ([place])   . HTN (hypertension)     Surgical History: Past Surgical History:  Procedure Laterality Date  . AMPUTATION TOE Right 07/14/2019   Procedure: AMPUTATION TOE RAY RIGHT 5TH PARTIAL;  Surgeon: Caroline More, DPM;  Location: [facility] ORS;  Service: Podiatry;  Laterality: Right;  . EYE SURGERY  Home Medications:  Allergies as of 10/24/2019      Reactions   Flomax [tamsulosin Hcl] Rash   Myrbetriq [mirabegron] Rash      Medication List       Accurate as of October 24, 2019  8:56 PM. If you have any questions, ask your nurse or doctor.        STOP taking these medications   enoxaparin 40 MG/0.4ML injection Commonly known as: LOVENOX Stopped by: Hollice Espy, MD   levofloxacin 500 MG tablet Commonly known as: LEVAQUIN Stopped by: Hollice Espy, MD   metroNIDAZOLE 500 MG tablet Commonly known as: FLAGYL Stopped by: Hollice Espy, MD     TAKE these medications   albuterol 108 (90 Base) MCG/ACT inhaler Commonly known as: VENTOLIN HFA Inhale 2 puffs into the lungs every 6 (six) hours as needed for wheezing or shortness of  breath.   amLODipine 10 MG tablet Commonly known as: NORVASC Take 10 mg by mouth daily.   aspirin-acetaminophen-caffeine 250-250-65 MG tablet Commonly known as: EXCEDRIN MIGRAINE Take 3 tablets by mouth daily as needed (Body aches).   cholecalciferol 25 MCG (1000 UNIT) tablet Commonly known as: VITAMIN D Take 1,000 Units by mouth 2 (two) times daily.   diphenhydrAMINE 25 MG tablet Commonly known as: BENADRYL Take 50 mg by mouth at bedtime.   doxycycline 100 MG tablet Commonly known as: ADOXA Take 1 tablet (100 mg total) by mouth 2 (two) times daily.   Fluticasone-Salmeterol 500-50 MCG/DOSE Aepb Commonly known as: ADVAIR Inhale 1 puff into the lungs 2 (two) times daily.   hydrochlorothiazide 25 MG tablet Commonly known as: HYDRODIURIL Take 25 mg by mouth daily.   ipratropium-albuterol 0.5-2.5 (3) MG/3ML Soln Commonly known as: DUONEB Take 3 mLs by nebulization every 6 (six) hours as needed (wheezing, shortness of breath).   lisinopril 40 MG tablet Commonly known as: ZESTRIL Take 40 mg by mouth daily.   omeprazole 20 MG capsule Commonly known as: PRILOSEC Take 20 mg by mouth daily.   tiotropium 18 MCG inhalation capsule Commonly known as: SPIRIVA Place 18 mcg into inhaler and inhale daily.   triamcinolone cream 0.1 % Commonly known as: KENALOG Apply 1 application topically 2 (two) times daily as needed (dry skin).   vitamin C 1000 MG tablet Take 1,000 mg by mouth daily.       Allergies:  Allergies  Allergen Reactions  . Flomax [Tamsulosin Hcl] Rash  . Myrbetriq [Mirabegron] Rash    Family History: Family History  Problem Relation Age of Onset  . Hypertension Mother   . Diabetes Father     Social History:  reports that he has been smoking cigarettes. He has a 56.00 pack-year smoking history. He has never used smokeless tobacco. He reports current alcohol use of about 42.0 standard drinks of alcohol per week. He reports that he does not use  drugs.   Physical Exam: BP 134/64   Pulse 73   Constitutional:  Alert and oriented, No acute distress. HEENT: [place] AT, moist mucus membranes.  Trachea midline, no masses. Cardiovascular: No clubbing, cyanosis, or edema. Respiratory: Normal respiratory effort, no increased work of breathing. Skin: No rashes, bruises or suspicious lesions. Neurologic: Grossly intact, no focal deficits, moving all 4 extremities. Psychiatric: Normal mood and affect.  Laboratory Data:  Lab Results  Component Value Date   CREATININE 0.90 07/03/2019   Pertinent Imaging: Results for orders placed or performed in visit on 10/24/19  Bladder Scan (Post Void Residual) in office  Result Value  Ref Range   Scan Result 38    Assessment & Plan:    1. Hx of prostate cancer History of high risk prostate cancer status post IMRT now on AD x 3 years (complete 2022) Eligard was given today, will continue on a every 6 month basis Reminded about bone health and weightbearing exercises PSA remains undetectable Calcium and vit D suppliment  2. BPH with obstruction/lower urinary tract symptoms Voiding spontaneously without issues PVR minimal IPSS as above  F/u 6 months with PSA, [facility] [address] [place], [place] [address] [phone]  I, Lucas Mallow, am acting as a scribe for Dr. Hollice Espy,  I have reviewed the above documentation for accuracy and completeness, and I agree with the above.   Hollice Espy, MD

## 2019-10-24 ENCOUNTER — Encounter: Payer: Self-pay | Admitting: Urology

## 2019-10-24 ENCOUNTER — Ambulatory Visit (INDEPENDENT_AMBULATORY_CARE_PROVIDER_SITE_OTHER): Payer: Medicare Other | Admitting: Urology

## 2019-10-24 ENCOUNTER — Other Ambulatory Visit: Payer: Self-pay

## 2019-10-24 VITALS — BP 134/64 | HR 73

## 2019-10-24 DIAGNOSIS — N401 Enlarged prostate with lower urinary tract symptoms: Secondary | ICD-10-CM | POA: Diagnosis not present

## 2019-10-24 DIAGNOSIS — C61 Malignant neoplasm of prostate: Secondary | ICD-10-CM | POA: Diagnosis not present

## 2019-10-24 DIAGNOSIS — N138 Other obstructive and reflux uropathy: Secondary | ICD-10-CM

## 2019-10-24 LAB — BLADDER SCAN AMB NON-IMAGING: Scan Result: 38

## 2019-10-24 MED ORDER — LEUPROLIDE ACETATE (6 MONTH) 45 MG ~~LOC~~ KIT
45.0000 mg | PACK | Freq: Once | SUBCUTANEOUS | Status: AC
  Administered 2019-10-24: 45 mg via SUBCUTANEOUS

## 2019-10-24 NOTE — Progress Notes (Signed)
Eligard SubQ Injection   Due to Prostate Cancer patient is present today for a Eligard Injection.  Medication: Eligard 6 month Dose: 45 mg  Location: left lower abdominal  Lot: [id number] Exp: 01/23  Patient tolerated well, no complications were noted  Performed by: Verlene Mayer, CMA ([place])  Per Dr. Erlene Quan patient is to continue therapy for 6 months . Patient's next follow up was scheduled for 04/30/20. This appointment was scheduled using wheel and given to patient today along with reminder continue on Vitamin D 800-1000iu and Calium 1000-1200mg  daily while on Androgen Deprivation Therapy.   PA approval dates: NO PA required

## 2020-02-01 ENCOUNTER — Inpatient Hospital Stay: Payer: Medicare Other

## 2020-02-08 ENCOUNTER — Ambulatory Visit: Payer: Medicare Other | Admitting: Radiation Oncology

## 2020-02-28 ENCOUNTER — Other Ambulatory Visit: Payer: Self-pay

## 2020-02-28 ENCOUNTER — Ambulatory Visit
Admission: RE | Admit: 2020-02-28 | Discharge: 2020-02-28 | Disposition: A | Discharge status: Home / Self Care | Payer: Medicare Other | Source: Ambulatory Visit | Attending: Radiation Oncology | Admitting: Radiation Oncology

## 2020-02-28 ENCOUNTER — Encounter: Payer: Self-pay | Admitting: Radiation Oncology

## 2020-02-28 VITALS — BP 130/60 | HR 69 | Temp 96.4°F | Wt 236.0 lb

## 2020-02-28 DIAGNOSIS — Z923 Personal history of irradiation: Secondary | ICD-10-CM | POA: Insufficient documentation

## 2020-02-28 DIAGNOSIS — C61 Malignant neoplasm of prostate: Secondary | ICD-10-CM | POA: Insufficient documentation

## 2020-02-28 NOTE — Progress Notes (Signed)
Radiation Oncology Follow up Note  Name: Christopher Mendez   Date:   02/28/2020 MRN:  [id number] DOB: 01-Mar-1951    This 69 y.o. male presents to the clinic today for 2-year follow-up status post IMRT radiation therapy to his prostate and pelvic nodes for Gleason 9 (4+5) adenocarcinoma presenting with a PSA of 56.  REFERRING PROVIDER: Frazier Richards, MD  HPI: Patient is a 69 year old male now out 2 years having completed IMRT radiation therapy to his prostate and pelvic nodes for Gleason 9 adenocarcinoma the prostate presenting with a PSA of 656.  Seen today in routine follow-up he is doing well he specifically denies diarrhea any increased lower urinary tract symptoms or fatigue.Marland Kitchen  He is currently on ADT therapy last Eligard was back in June his most recent PSA is less than 0.1  COMPLICATIONS OF TREATMENT: none  FOLLOW UP COMPLIANCE: keeps appointments   PHYSICAL EXAM:  BP 130/60   Pulse 69   Temp (!) 96.4 F (35.8 C) (Tympanic)   Wt 236 lb (107 kg)   BMI 32.01 kg/m  Well-developed well-nourished patient in NAD. HEENT reveals PERLA, EOMI, discs not visualized.  Oral cavity is clear. No oral mucosal lesions are identified. Neck is clear without evidence of cervical or supraclavicular adenopathy. Lungs are clear to A&P. Cardiac examination is essentially unremarkable with regular rate and rhythm without murmur rub or thrill. Abdomen is benign with no organomegaly or masses noted. Motor sensory and DTR levels are equal and symmetric in the upper and lower extremities. Cranial nerves II through XII are grossly intact. Proprioception is intact. No peripheral adenopathy or edema is identified. No motor or sensory levels are noted. Crude visual fields are within normal range.  RADIOLOGY RESULTS: No current films to review  PLAN: Present time patient is under excellent biochemical control of his prostate cancer continues on Eligard under urology's direction.  I am pleased with his overall  progress.  I have asked to see him back in 1 year for follow-up.  Patient knows to call with any concerns at any time.  I would like to take this opportunity to thank you for allowing me to participate in the care of your patient.Noreene Filbert, MD

## 2020-04-12 ENCOUNTER — Other Ambulatory Visit: Payer: Self-pay | Admitting: Family Medicine

## 2020-04-12 DIAGNOSIS — C61 Malignant neoplasm of prostate: Secondary | ICD-10-CM

## 2020-04-24 ENCOUNTER — Other Ambulatory Visit: Payer: Medicare Other

## 2020-04-24 ENCOUNTER — Other Ambulatory Visit: Payer: Self-pay

## 2020-04-24 DIAGNOSIS — C61 Malignant neoplasm of prostate: Secondary | ICD-10-CM

## 2020-04-25 LAB — PSA: Prostate Specific Ag, Serum: <0.1 ng/mL (ref 0.0–4.0)

## 2020-04-30 ENCOUNTER — Other Ambulatory Visit: Payer: Self-pay

## 2020-04-30 ENCOUNTER — Ambulatory Visit (INDEPENDENT_AMBULATORY_CARE_PROVIDER_SITE_OTHER): Payer: Medicare Other | Admitting: Urology

## 2020-04-30 VITALS — BP 153/70 | HR 71 | Ht 72.0 in | Wt 235.0 lb

## 2020-04-30 DIAGNOSIS — N138 Other obstructive and reflux uropathy: Secondary | ICD-10-CM | POA: Diagnosis not present

## 2020-04-30 DIAGNOSIS — C61 Malignant neoplasm of prostate: Secondary | ICD-10-CM

## 2020-04-30 DIAGNOSIS — N401 Enlarged prostate with lower urinary tract symptoms: Secondary | ICD-10-CM

## 2020-04-30 MED ORDER — LEUPROLIDE ACETATE (6 MONTH) 45 MG ~~LOC~~ KIT
45.0000 mg | PACK | Freq: Once | SUBCUTANEOUS | Status: AC
  Administered 2020-04-30: 45 mg via SUBCUTANEOUS

## 2020-04-30 NOTE — Progress Notes (Signed)
04/30/2020 1:17 PM   Christopher Mendez 07-16-1950 [id number]  Referring provider: Frazier Richards, [place],  [place] [address]  Chief Complaint  Patient presents with  . Prostate Cancer    HPI:  69 y.o. M with a history of high risk prostate cancer status post XRT currently on ADT, history ofurinary retention who presents today for routine 82-month follow-up.   He was alst seen 10/24/19.    He initially presented with a markedly elevated PSA to 56.7 with a rockhard nodular prostate consistent with clinical prostate cancer. He also was in urinary retention unable to void spontaneously at the time.  He was started ondegerelixand underwent multiple failed voiding trials. Ultimately, he started on CIC and underwent prostate biopsy on 09/23/2017.  This revealed a 12 of 12 cores positive of high risk disease, Gleason 4+5 and 4+4 in all cores involving up to 100% of the tissue.  CT abdomen pelvis with contrast shows an irregular mass extending from the peripheral aspect of the prostate involving the adjacent base of the bladder. He also had an indeterminate sclerotic lesion of the femoral neck and L3 vertebral body. There is no significant pelvic adenopathy or any other concerning for metastatic disease. He underwent bone scan which was unremarkable.  He is now completed EBRT(completed 12/2017)and continues on ADT. He is due for six-month injection today. He is agreed to continue 3-year course of ADT, will be complete in 2022.  PSA remains undetectable as of 04/24/2020.  He continues to void spontaneously without difficulty.  He does have urinary frequency related to his diuretic medication.  Otherwise no complaints today.  PMH: Past Medical History:  Diagnosis Date  . Cardiac enlargement   . COPD (chronic obstructive pulmonary disease) ([place])   . HTN (hypertension)     Surgical History: Past Surgical History:  Procedure Laterality Date  . AMPUTATION  TOE Right 07/14/2019   Procedure: AMPUTATION TOE RAY RIGHT 5TH PARTIAL;  Surgeon: Caroline More, DPM;  Location: [facility] ORS;  Service: Podiatry;  Laterality: Right;  . EYE SURGERY      Home Medications:  Allergies as of 04/30/2020      Reactions   Flomax [tamsulosin Hcl] Rash   Myrbetriq [mirabegron] Rash      Medication List       Accurate as of April 30, 2020  1:17 PM. If you have any questions, ask your nurse or doctor.        STOP taking these medications   doxycycline 100 MG tablet Commonly known as: ADOXA Stopped by: Hollice Espy, MD   ipratropium-albuterol 0.5-2.5 (3) MG/3ML Soln Commonly known as: DUONEB Stopped by: Hollice Espy, MD     TAKE these medications   albuterol 108 (90 Base) MCG/ACT inhaler Commonly known as: VENTOLIN HFA Inhale 2 puffs into the lungs every 6 (six) hours as needed for wheezing or shortness of breath.   amLODipine 10 MG tablet Commonly known as: NORVASC Take 10 mg by mouth daily.   aspirin-acetaminophen-caffeine 250-250-65 MG tablet Commonly known as: EXCEDRIN MIGRAINE Take 3 tablets by mouth daily as needed (Body aches).   cholecalciferol 25 MCG (1000 UNIT) tablet Commonly known as: VITAMIN D Take 1,000 Units by mouth 2 (two) times daily.   diphenhydrAMINE 25 MG tablet Commonly known as: BENADRYL Take 50 mg by mouth at bedtime.   Fluticasone-Salmeterol 500-50 MCG/DOSE Aepb Commonly known as: ADVAIR Inhale 1 puff into the lungs 2 (two) times daily.   gabapentin 100 MG capsule Commonly  known as: NEURONTIN 100 mg in the morning, at noon, and at bedtime.   hydrochlorothiazide 25 MG tablet Commonly known as: HYDRODIURIL Take 25 mg by mouth daily.   lisinopril 40 MG tablet Commonly known as: ZESTRIL Take 40 mg by mouth daily.   metFORMIN 500 MG tablet Commonly known as: GLUCOPHAGE Take 500 mg by mouth.   omeprazole 20 MG capsule Commonly known as: PRILOSEC Take 20 mg by mouth daily.   tiotropium 18 MCG inhalation  capsule Commonly known as: SPIRIVA Place 18 mcg into inhaler and inhale daily.   triamcinolone 0.1 % Commonly known as: KENALOG Apply 1 application topically 2 (two) times daily as needed (dry skin).   vitamin C 1000 MG tablet Take 1,000 mg by mouth daily.       Allergies:  Allergies  Allergen Reactions  . Flomax [Tamsulosin Hcl] Rash  . Myrbetriq [Mirabegron] Rash    Family History: Family History  Problem Relation Age of Onset  . Hypertension Mother   . Diabetes Father     Social History:  reports that he has been smoking cigarettes. He has a 56.00 pack-year smoking history. He has never used smokeless tobacco. He reports current alcohol use of about 42.0 standard drinks of alcohol per week. He reports that he does not use drugs.   Physical Exam: BP (!) 153/70   Pulse 71   Ht 6' (1.829 m)   Wt 235 lb (106.6 kg)   BMI 31.87 kg/m   Constitutional:  Alert and oriented, No acute distress.  In good spirits, accompanied by wife. HEENT: [place] AT, moist mucus membranes.  Trachea midline, no masses. Cardiovascular: No clubbing, cyanosis, or edema. Respiratory: Normal respiratory effort, no increased work of breathing. Skin: No rashes, bruises or suspicious lesions. Neurologic: Grossly intact, no focal deficits, moving all 4 extremities. Psychiatric: Normal mood and affect.   Assessment & Plan:    1. Prostate cancer [facility]) History of high risk prostate cancer status post IMRT now on AD x 3 years (complete 2022) Eligard was given today-last dose PSA remains undetectable, will continue to follow and establish nadir  2. BPH with obstruction/lower urinary tract symptoms Symptoms stable  F/u 6 months with PSA   Hollice Espy, MD  [facility] [address] [place], [place] [address] [phone]

## 2020-04-30 NOTE — Progress Notes (Signed)
Eligard SubQ Injection   Due to Prostate Cancer patient is present today for a Eligard Injection.  Medication: Eligard 6 month Dose: 45 mg  Location: left  Lot: [id number] Exp: 08/22/2021  Patient tolerated well, no complications were noted  Performed by: Kerman Passey, RMA  Per Dr. Erlene Quan patient is to continue therapy for 6 months . Patient's next follow up was scheduled for 6 months with PSA, No Eligard. This appointment was scheduled using wheel and given to patient today along with reminder continue on Vitamin D 800-1000iu and Calium 1000-1200mg  daily while on Androgen Deprivation Therapy.

## 2020-10-29 ENCOUNTER — Other Ambulatory Visit: Payer: Medicare Other

## 2020-10-29 ENCOUNTER — Other Ambulatory Visit: Payer: Self-pay | Admitting: *Deleted

## 2020-10-29 ENCOUNTER — Ambulatory Visit: Payer: Self-pay | Admitting: Urology

## 2020-10-29 ENCOUNTER — Other Ambulatory Visit: Payer: Self-pay

## 2020-10-29 DIAGNOSIS — C61 Malignant neoplasm of prostate: Secondary | ICD-10-CM

## 2020-10-30 ENCOUNTER — Ambulatory Visit (INDEPENDENT_AMBULATORY_CARE_PROVIDER_SITE_OTHER): Payer: Medicare Other | Admitting: Urology

## 2020-10-30 ENCOUNTER — Encounter: Payer: Self-pay | Admitting: Urology

## 2020-10-30 VITALS — BP 145/83 | HR 65 | Ht 72.0 in | Wt 230.0 lb

## 2020-10-30 DIAGNOSIS — N138 Other obstructive and reflux uropathy: Secondary | ICD-10-CM | POA: Diagnosis not present

## 2020-10-30 DIAGNOSIS — R3915 Urgency of urination: Secondary | ICD-10-CM | POA: Diagnosis not present

## 2020-10-30 DIAGNOSIS — C61 Malignant neoplasm of prostate: Secondary | ICD-10-CM

## 2020-10-30 DIAGNOSIS — N401 Enlarged prostate with lower urinary tract symptoms: Secondary | ICD-10-CM

## 2020-10-30 LAB — URINALYSIS, COMPLETE
Bilirubin, UA: NEGATIVE
Glucose, UA: NEGATIVE
Ketones, UA: NEGATIVE
Leukocytes,UA: NEGATIVE
Nitrite, UA: NEGATIVE
Protein,UA: NEGATIVE
RBC, UA: NEGATIVE
Specific Gravity, UA: 1.02 (ref 1.005–1.030)
Urobilinogen, Ur: 0.2 mg/dL (ref 0.2–1.0)
pH, UA: 5.5 (ref 5.0–7.5)

## 2020-10-30 LAB — MICROSCOPIC EXAMINATION
Bacteria, UA: NONE SEEN
RBC, Urine: NONE SEEN /hpf (ref 0–2)

## 2020-10-30 LAB — PSA: Prostate Specific Ag, Serum: <0.1 ng/mL (ref 0.0–4.0)

## 2020-10-30 MED ORDER — OXYBUTYNIN CHLORIDE ER 10 MG PO TB24: 10.0000 mg | ORAL_TABLET | Freq: Every day | ORAL | 5 refills | Status: DC

## 2020-10-30 NOTE — Progress Notes (Signed)
10/30/2020 8:59 AM   Christopher Mendez 08/23/1950 [id number]  Referring provider: Frazier Richards, [place],  [place] [address]  Chief Complaint  Patient presents with   Prostate Cancer    HPI: 70 year old male with personal history of high risk prostate cancer returns today for 22-month follow-up.  Prostate cancer history as below.  His PSA remains undetectable.  He last received a 58-month Depo of Lupron on 10/30/2019.  He continues to void spontaneously without the need for CIC.  Today, his primary complaint is urinary frequency and urgency.  He also is now having episodes of occasional urge incontinence.  He denies any dysuria or UTI symptoms.  He has previously tried Myrbetriq to which he developed a rash.  UA today negative  PVR 0   Prostate cancer history: He initially presented with a markedly elevated PSA to 56.7 with a rockhard nodular prostate consistent with clinical prostate cancer.  He also was in urinary retention unable to void spontaneously at the time.   He was started on degerelix and underwent multiple failed voiding trials.  Ultimately, he started on CIC and underwent prostate biopsy on 09/23/2017.   This revealed a 12 of 12 cores positive of high risk disease, Gleason 4+5 and 4+4 in all cores involving up to 100% of the tissue.   CT abdomen pelvis with contrast shows an irregular mass extending from the peripheral aspect of the prostate involving the adjacent base of the bladder.  He also had an indeterminate sclerotic lesion of the femoral neck and L3 vertebral body.  There is no significant pelvic adenopathy or any other concerning for metastatic disease.  He underwent bone scan which was unremarkable.   He is now completed EBRT (completed 12/2017) and continues on ADT.  He is due for six-month injection today.   He is agreed to continue 3-year course of ADT, will be complete in 2022.     PMH: Past Medical History:  Diagnosis Date   Cardiac  enlargement    COPD (chronic obstructive pulmonary disease) ([place])    HTN (hypertension)     Surgical History: Past Surgical History:  Procedure Laterality Date   AMPUTATION TOE Right 07/14/2019   Procedure: AMPUTATION TOE RAY RIGHT 5TH PARTIAL;  Surgeon: Caroline More, DPM;  Location: [facility] ORS;  Service: Podiatry;  Laterality: Right;   EYE SURGERY      Home Medications:  Allergies as of 10/30/2020       Reactions   Flomax [tamsulosin Hcl] Rash   Myrbetriq [mirabegron] Rash        Medication List        Accurate as of October 30, 2020  8:59 AM. If you have any questions, ask your nurse or doctor.          STOP taking these medications    amLODipine 10 MG tablet Commonly known as: NORVASC Stopped by: Hollice Espy, MD   cholecalciferol 25 MCG (1000 UNIT) tablet Commonly known as: VITAMIN D Stopped by: Hollice Espy, MD   diphenhydrAMINE 25 MG tablet Commonly known as: BENADRYL Stopped by: Hollice Espy, MD       TAKE these medications    albuterol 108 (90 Base) MCG/ACT inhaler Commonly known as: VENTOLIN HFA Inhale 2 puffs into the lungs every 6 (six) hours as needed for wheezing or shortness of breath.   aspirin-acetaminophen-caffeine 250-250-65 MG tablet Commonly known as: EXCEDRIN MIGRAINE Take 3 tablets by mouth daily as needed (Body aches).   cephALEXin 500  MG capsule Commonly known as: KEFLEX Take by mouth.   Fluticasone-Salmeterol 500-50 MCG/DOSE Aepb Commonly known as: ADVAIR Inhale 1 puff into the lungs 2 (two) times daily.   gabapentin 100 MG capsule Commonly known as: NEURONTIN 100 mg in the morning, at noon, and at bedtime.   hydrochlorothiazide 25 MG tablet Commonly known as: HYDRODIURIL Take 25 mg by mouth daily.   lisinopril 40 MG tablet Commonly known as: ZESTRIL Take 40 mg by mouth daily.   metFORMIN 500 MG tablet Commonly known as: GLUCOPHAGE Take 500 mg by mouth.   omeprazole 20 MG capsule Commonly known as:  PRILOSEC Take 20 mg by mouth daily.   rosuvastatin 20 MG tablet Commonly known as: CRESTOR Take 20 mg by mouth daily.   tiotropium 18 MCG inhalation capsule Commonly known as: SPIRIVA Place 18 mcg into inhaler and inhale daily.   triamcinolone cream 0.1 % Commonly known as: KENALOG Apply 1 application topically 2 (two) times daily as needed (dry skin).   vitamin C 1000 MG tablet Take 1,000 mg by mouth daily.        Allergies:  Allergies  Allergen Reactions   Flomax [Tamsulosin Hcl] Rash   Myrbetriq [Mirabegron] Rash    Family History: Family History  Problem Relation Age of Onset   Hypertension Mother    Diabetes Father     Social History:  reports that he has been smoking cigarettes. He has a 56.00 pack-year smoking history. He has never used smokeless tobacco. He reports current alcohol use of about 42.0 standard drinks of alcohol per week. He reports that he does not use drugs.   Physical Exam: BP (!) 145/83   Pulse 65   Ht 6' (1.829 m)   Wt 230 lb (104.3 kg)   BMI 31.19 kg/m   Constitutional:  Alert and oriented, No acute distress.  Accompanied by his wife today.  Wearing overalls. HEENT: [place] AT, moist mucus membranes.  Trachea midline, no masses. Cardiovascular: No clubbing, cyanosis, or edema. Respiratory: Normal respiratory effort, no increased work of breathing. Skin: No rashes, bruises or suspicious lesions. Neurologic: Grossly intact, no focal deficits, moving all 4 extremities. Psychiatric: Normal mood and affect.  Laboratory Data: Lab Results  Component Value Date   WBC 6.0 12/16/2017   HGB 13.5 12/16/2017   HCT 39.8 (L) 12/16/2017   MCV 84.9 12/16/2017   PLT 274 12/16/2017    Lab Results  Component Value Date   CREATININE 0.90 07/03/2019   Results for orders placed or performed in visit on 10/30/20  Microscopic Examination   Urine  Result Value Ref Range   WBC, UA 0-5 0 - 5 /hpf   RBC None seen 0 - 2 /hpf   Epithelial Cells (non  renal) 0-10 0 - 10 /hpf   Bacteria, UA None seen None seen/Few  Urinalysis, Complete  Result Value Ref Range   Specific Gravity, UA 1.020 1.005 - 1.030   pH, UA 5.5 5.0 - 7.5   Color, UA Yellow Yellow   Appearance Ur Clear Clear   Leukocytes,UA Negative Negative   Protein,UA Negative Negative/Trace   Glucose, UA Negative Negative   Ketones, UA Negative Negative   RBC, UA Negative Negative   Bilirubin, UA Negative Negative   Urobilinogen, Ur 0.2 0.2 - 1.0 mg/dL   Nitrite, UA Negative Negative   Microscopic Examination See below:        Assessment & Plan:    1. Prostate cancer ([place]) No evidence of disease, PSA remains undetectable  Given that its been now 3 years on ADT, will discontinue further therapy but continue to monitor PSA on a every 6 month basis  He is agreeable this plan - BLADDER SCAN AMB NON-IMAGING - Urinalysis, Complete  2. Urinary urgency Worsening urinary urgency/frequency, previously unable to tolerate Myrbetriq  No evidence of UTI today is contributing factor, adequate bladder emptying  Will prescribe oxybutynin 10 mg XL daily for OAB, discussed possible side effects including dry eyes dry mouth and constipation.  Reassess in 6 months  Follow-up 6 months for IPSS/PVR/PSA  Hollice Espy, MD  [facility] [address], [place], [place] [address] [phone]

## 2021-02-26 ENCOUNTER — Ambulatory Visit
Admission: RE | Admit: 2021-02-26 | Discharge: 2021-02-26 | Disposition: A | Discharge status: Home / Self Care | Payer: Medicare Other | Source: Ambulatory Visit | Attending: Radiation Oncology | Admitting: Radiation Oncology

## 2021-02-26 ENCOUNTER — Other Ambulatory Visit: Payer: Self-pay

## 2021-02-26 ENCOUNTER — Encounter: Payer: Self-pay | Admitting: Radiation Oncology

## 2021-02-26 VITALS — BP 122/65 | HR 63 | Temp 96.2°F | Wt 209.0 lb

## 2021-02-26 DIAGNOSIS — C61 Malignant neoplasm of prostate: Secondary | ICD-10-CM | POA: Diagnosis present

## 2021-02-26 DIAGNOSIS — Z923 Personal history of irradiation: Secondary | ICD-10-CM | POA: Insufficient documentation

## 2021-02-26 DIAGNOSIS — C775 Secondary and unspecified malignant neoplasm of intrapelvic lymph nodes: Secondary | ICD-10-CM | POA: Insufficient documentation

## 2021-02-26 NOTE — Progress Notes (Signed)
Radiation Oncology Follow up Note  Name: Christopher Mendez   Date:   02/26/2021 MRN:  [id number] DOB: March 16, 1951    This 70 y.o. male presents to the clinic today for 3-year follow-up status post IMRT radiation therapy to his prostate and pelvic nodes for Gleason 9 (4+5 adenocarcinoma presenting with a PSA of 56.  REFERRING PROVIDER: Frazier Richards, MD  HPI: Patient is a 70 year old male now out 3 years having completed IMRT radiation therapy to prostate and pelvic nodes for Gleason 9 adenocarcinoma.  Seen today in routine follow-up he is doing extremely well specifically denies any increased lower urinary tract symptoms diarrhea or fatigue.  He has recently been discontinued ADT therapy through urology.  His most recent PSA is less than 0.1.Marland Kitchen  He has recently been put on oxybutynin which seems to help with some of his urgency.  COMPLICATIONS OF TREATMENT: none  FOLLOW UP COMPLIANCE: keeps appointments   PHYSICAL EXAM:  BP 122/65 (BP Location: Left Arm, Patient Position: Sitting)   Pulse 63   Temp (!) 96.2 F (35.7 C) (Tympanic)   Wt 209 lb (94.8 kg)   SpO2 94%   BMI 28.35 kg/m  Well-developed well-nourished patient in NAD. HEENT reveals PERLA, EOMI, discs not visualized.  Oral cavity is clear. No oral mucosal lesions are identified. Neck is clear without evidence of cervical or supraclavicular adenopathy. Lungs are clear to A&P. Cardiac examination is essentially unremarkable with regular rate and rhythm without murmur rub or thrill. Abdomen is benign with no organomegaly or masses noted. Motor sensory and DTR levels are equal and symmetric in the upper and lower extremities. Cranial nerves II through XII are grossly intact. Proprioception is intact. No peripheral adenopathy or edema is identified. No motor or sensory levels are noted. Crude visual fields are within normal range.  RADIOLOGY RESULTS: No current films for review  PLAN: Present time patient is doing well under excellent  biochemical control of his prostate cancer with low side effect profile.  On pleased with his overall progress.  Of asked to see him back in 1 year for follow-up.  He continues close follow-up care with urology.  Patient is to call with any concerns.  I would like to take this opportunity to thank you for allowing me to participate in the care of your patient.Noreene Filbert, MD

## 2021-05-02 ENCOUNTER — Other Ambulatory Visit: Payer: Self-pay

## 2021-05-02 DIAGNOSIS — C61 Malignant neoplasm of prostate: Secondary | ICD-10-CM

## 2021-05-05 ENCOUNTER — Encounter: Payer: Self-pay | Admitting: Urology

## 2021-05-05 ENCOUNTER — Other Ambulatory Visit: Payer: Medicare Other

## 2021-05-06 ENCOUNTER — Ambulatory Visit: Payer: Medicare Other | Admitting: Urology

## 2021-05-09 ENCOUNTER — Other Ambulatory Visit: Payer: Medicare Other

## 2021-05-09 ENCOUNTER — Other Ambulatory Visit: Payer: Self-pay

## 2021-05-09 DIAGNOSIS — C61 Malignant neoplasm of prostate: Secondary | ICD-10-CM

## 2021-05-10 LAB — PSA: Prostate Specific Ag, Serum: <0.1 ng/mL (ref 0.0–4.0)

## 2021-05-13 NOTE — Progress Notes (Signed)
05/14/21 8:53 AM   Christopher Mendez Sep 07, 1950 [id number]  Referring provider:  Frazier Richards, [place],  [place] [address] Chief Complaint  Patient presents with   Prostate Cancer     HPI: Christopher Mendez is a 70 y.o.male with a personal history of prostate cancer and urinary frequency , who presents today for 6 month follow-up with IPSS, PVR, and PSA.   He initially presented with a markedly elevated PSA to 56.7 with a rockhard nodular prostate consistent with clinical prostate cancer.  He also was in urinary retention unable to void spontaneously at the time.   He was started on degerelix and underwent multiple failed voiding trials.  Ultimately, he started on CIC and underwent prostate biopsy on 09/23/2017.   This revealed a 12 of 12 cores positive of high risk disease, Gleason 4+5 and 4+4 in all cores involving up to 100% of the tissue.  He last received a 51-month Depo of Lupron on 10/30/2020.   He is no longer taking oxybutynin and completed a 3 year course of ADT.   He is accompanied today by his wife. He reports today that he has started to lose weight in order to control his diabetes, he is doing well with this and has lost around 30 lbs. he has been in multiple aspects of his life including his urination.  He has watching his diet and trying to get more exercise.   IPSS     Row Name 05/14/21 0800         International Prostate Symptom Score   How often have you had the sensation of not emptying your bladder? Not at All     How often have you had to urinate less than every two hours? About half the time     How often have you found you stopped and started again several times when you urinated? Less than 1 in 5 times     How often have you found it difficult to postpone urination? Not at All     How often have you had a weak urinary stream? Not at All     How often have you had to strain to start urination? Not at All     How many times did you  typically get up at night to urinate? 3 Times     Total IPSS Score 7       Quality of Life due to urinary symptoms   If you were to spend the rest of your life with your urinary condition just the way it is now how would you feel about that? Mostly Satisfied              Score:  1-7 Mild 8-19 Moderate 20-35 Severe  PMH: Past Medical History:  Diagnosis Date   Cardiac enlargement    COPD (chronic obstructive pulmonary disease) (HCC)    HTN (hypertension)     Surgical History: Past Surgical History:  Procedure Laterality Date   AMPUTATION TOE Right 07/14/2019   Procedure: AMPUTATION TOE RAY RIGHT 5TH PARTIAL;  Surgeon: Caroline More, DPM;  Location: [facility] ORS;  Service: Podiatry;  Laterality: Right;   EYE SURGERY      Home Medications:  Allergies as of 05/14/2021       Reactions   Flomax [tamsulosin Hcl] Rash   Myrbetriq [mirabegron] Rash        Medication List        Accurate as of May 14, 2021  8:53 AM. If you have any questions, ask your nurse or doctor.          STOP taking these medications    oxybutynin 10 MG 24 hr tablet Commonly known as: DITROPAN-XL Stopped by: Hollice Espy, MD       TAKE these medications    albuterol 108 (90 Base) MCG/ACT inhaler Commonly known as: VENTOLIN HFA Inhale 2 puffs into the lungs every 6 (six) hours as needed for wheezing or shortness of breath.   amLODipine 10 MG tablet Commonly known as: NORVASC Take 10 mg by mouth daily.   aspirin-acetaminophen-caffeine 250-250-65 MG tablet Commonly known as: EXCEDRIN MIGRAINE Take 3 tablets by mouth daily as needed (Body aches).   Fluticasone-Salmeterol 500-50 MCG/DOSE Aepb Commonly known as: ADVAIR Inhale 1 puff into the lungs 2 (two) times daily.   gabapentin 100 MG capsule Commonly known as: NEURONTIN 100 mg in the morning, at noon, and at bedtime.   hydrochlorothiazide 25 MG tablet Commonly known as: HYDRODIURIL Take 25 mg by mouth daily.    lisinopril 40 MG tablet Commonly known as: ZESTRIL Take 40 mg by mouth daily.   metFORMIN 1000 MG tablet Commonly known as: GLUCOPHAGE Take 1,000 mg by mouth 2 (two) times daily.   omeprazole 20 MG capsule Commonly known as: PRILOSEC Take 20 mg by mouth daily.   rosuvastatin 20 MG tablet Commonly known as: CRESTOR Take 20 mg by mouth daily.   tiotropium 18 MCG inhalation capsule Commonly known as: SPIRIVA Place 18 mcg into inhaler and inhale daily.   triamcinolone cream 0.1 % Commonly known as: KENALOG Apply 1 application topically 2 (two) times daily as needed (dry skin).   vitamin C 1000 MG tablet Take 1,000 mg by mouth daily.       Allergies:  Allergies  Allergen Reactions   Flomax [Tamsulosin Hcl] Rash   Myrbetriq [Mirabegron] Rash   Family History: Family History  Problem Relation Age of Onset   Hypertension Mother    Diabetes Father    Social History:  reports that he has been smoking cigarettes. He has a 56.00 pack-year smoking history. He has never used smokeless tobacco. He reports current alcohol use of about 42.0 standard drinks per week. He reports that he does not use drugs.  Physical Exam: BP (!) 144/75    Pulse 66    Ht 6' (1.829 m)    Wt 198 lb (89.8 kg)    BMI 26.85 kg/m   Constitutional:  Alert and oriented, No acute distress. HEENT: [place] AT, moist mucus membranes.  Trachea midline, no masses. Cardiovascular: No clubbing, cyanosis, or edema. Respiratory: Normal respiratory effort, no increased work of breathing. Skin: No rashes, bruises or suspicious lesions. Neurologic: Grossly intact, no focal deficits, moving all 4 extremities. Psychiatric: Normal mood and affect.  Laboratory Data:  Lab Results  Component Value Date   CREATININE 0.90 07/03/2019    Pertinent Imaging: Results for orders placed or performed in visit on 05/14/21  BLADDER SCAN AMB NON-IMAGING  Result Value Ref Range   Scan Result 17 ml     Assessment & Plan:     Prostate cancer  - No evidence of disease, PSA remains undetectable -No further ADT, continue to monitor PSA and a every 6 month basis  2. BPH with urinary obstruction  - He has symptomatically improved, no longer on any BPH or OAB meds  Return in 6 months for IPSS, PSA, and PVR  I,Kailey Littlejohn,acting as a scribe for Hollice Espy, MD.,have  documented all relevant documentation on the behalf of Hollice Espy, MD,as directed by  Hollice Espy, MD while in the presence of Hollice Espy, MD.  I have reviewed the above documentation for accuracy and completeness, and I agree with the above.   Hollice Espy, MD  [facility] [address], [place], [place] [address] [phone]

## 2021-05-14 ENCOUNTER — Ambulatory Visit (INDEPENDENT_AMBULATORY_CARE_PROVIDER_SITE_OTHER): Payer: Medicare Other | Admitting: Urology

## 2021-05-14 ENCOUNTER — Other Ambulatory Visit: Payer: Self-pay

## 2021-05-14 VITALS — BP 144/75 | HR 66 | Ht 72.0 in | Wt 198.0 lb

## 2021-05-14 DIAGNOSIS — N138 Other obstructive and reflux uropathy: Secondary | ICD-10-CM | POA: Diagnosis not present

## 2021-05-14 DIAGNOSIS — N401 Enlarged prostate with lower urinary tract symptoms: Secondary | ICD-10-CM | POA: Diagnosis not present

## 2021-05-14 DIAGNOSIS — C61 Malignant neoplasm of prostate: Secondary | ICD-10-CM | POA: Diagnosis not present

## 2021-05-14 LAB — BLADDER SCAN AMB NON-IMAGING: Scan Result: 17

## 2021-11-06 ENCOUNTER — Other Ambulatory Visit: Payer: Medicare Other

## 2021-11-12 ENCOUNTER — Ambulatory Visit: Payer: Medicare Other | Admitting: Urology

## 2021-11-12 NOTE — Progress Notes (Incomplete)
11/12/21 6:05 AM   Christopher Mendez 12-30-1950 [id number]  Referring provider:  Frazier Richards, [place],  [place] [address] No chief complaint on file.     HPI: Christopher Mendez is a 71 y.o.male with a personal history of, prostate cancer and urinary frequency , who presents today for a 6 month follow-up with IPSS PSA and PVR.   He initially presented with a markedly elevated PSA to 56.7 with a rockhard nodular prostate consistent with clinical prostate cancer.  He also was in urinary retention unable to void spontaneously at the time.   He was started on degerelix and underwent multiple failed voiding trials.  Ultimately, he started on CIC and underwent prostate biopsy on 09/23/2017.   This revealed a 12 of 12 cores positive of high risk disease, Gleason 4+5 and 4+4 in all cores involving up to 100% of the tissue.   He last received a 64-monthDepo of Lupron on 10/30/2020. He has completed a 3 year course of ADT.  His most recent PSA on 05/09/2021 has remained undetectable.      PMH: Past Medical History:  Diagnosis Date   Cardiac enlargement    COPD (chronic obstructive pulmonary disease) ([facility]    HTN (hypertension)     Surgical History: Past Surgical History:  Procedure Laterality Date   AMPUTATION TOE Right 07/14/2019   Procedure: AMPUTATION TOE RAY RIGHT 5TH PARTIAL;  Surgeon: BCaroline More DPM;  Location: [facility] ORS;  Service: Podiatry;  Laterality: Right;   EYE SURGERY      Home Medications:  Allergies as of 11/12/2021       Reactions   Flomax [tamsulosin Hcl] Rash   Myrbetriq [mirabegron] Rash        Medication List        Accurate as of November 12, 2021  6:05 AM. If you have any questions, ask your nurse or doctor.          albuterol 108 (90 Base) MCG/ACT inhaler Commonly known as: VENTOLIN HFA Inhale 2 puffs into the lungs every 6 (six) hours as needed for wheezing or shortness of breath.   amLODipine 10 MG tablet Commonly  known as: NORVASC Take 10 mg by mouth daily.   aspirin-acetaminophen-caffeine 250-250-65 MG tablet Commonly known as: EXCEDRIN MIGRAINE Take 3 tablets by mouth daily as needed (Body aches).   Fluticasone-Salmeterol 500-50 MCG/DOSE Aepb Commonly known as: ADVAIR Inhale 1 puff into the lungs 2 (two) times daily.   gabapentin 100 MG capsule Commonly known as: NEURONTIN 100 mg in the morning, at noon, and at bedtime.   hydrochlorothiazide 25 MG tablet Commonly known as: HYDRODIURIL Take 25 mg by mouth daily.   lisinopril 40 MG tablet Commonly known as: ZESTRIL Take 40 mg by mouth daily.   metFORMIN 1000 MG tablet Commonly known as: GLUCOPHAGE Take 1,000 mg by mouth 2 (two) times daily.   omeprazole 20 MG capsule Commonly known as: PRILOSEC Take 20 mg by mouth daily.   rosuvastatin 20 MG tablet Commonly known as: CRESTOR Take 20 mg by mouth daily.   tiotropium 18 MCG inhalation capsule Commonly known as: SPIRIVA Place 18 mcg into inhaler and inhale daily.   triamcinolone cream 0.1 % Commonly known as: KENALOG Apply 1 application topically 2 (two) times daily as needed (dry skin).   vitamin C 1000 MG tablet Take 1,000 mg by mouth daily.        Allergies:  Allergies  Allergen Reactions   Flomax [Tamsulosin  Hcl] Rash   Myrbetriq [Mirabegron] Rash    Family History: Family History  Problem Relation Age of Onset   Hypertension Mother    Diabetes Father     Social History:  reports that he has been smoking cigarettes. He has a 56.00 pack-year smoking history. He has never used smokeless tobacco. He reports current alcohol use of about 42.0 standard drinks of alcohol per week. He reports that he does not use drugs.   Physical Exam: There were no vitals taken for this visit.  Constitutional:  Alert and oriented, No acute distress. HEENT: [place] AT, moist mucus membranes.  Trachea midline, no masses. Cardiovascular: No clubbing, cyanosis, or edema. Respiratory:  Normal respiratory effort, no increased work of breathing. Skin: No rashes, bruises or suspicious lesions. Neurologic: Grossly intact, no focal deficits, moving all 4 extremities. Psychiatric: Normal mood and affect.  Laboratory Data:  Lab Results  Component Value Date   CREATININE 0.90 07/03/2019   No results found for: "HGBA1C"  Urinalysis   Pertinent Imaging: No results found for any visits on 11/12/21.    Assessment & Plan:     No follow-ups on file.  I,Kailey Littlejohn,acting as a Education administrator for Hollice Espy, MD.,have documented all relevant documentation on the behalf of Hollice Espy, MD,as directed by  Hollice Espy, MD while in the presence of Hollice Espy, [place] [address], [place], [place] [address] [phone]

## 2021-11-14 ENCOUNTER — Encounter: Payer: Self-pay | Admitting: Urology

## 2021-12-29 ENCOUNTER — Other Ambulatory Visit: Payer: Medicare Other

## 2021-12-29 DIAGNOSIS — C61 Malignant neoplasm of prostate: Secondary | ICD-10-CM

## 2021-12-30 ENCOUNTER — Ambulatory Visit (INDEPENDENT_AMBULATORY_CARE_PROVIDER_SITE_OTHER): Payer: Medicare Other | Admitting: Urology

## 2021-12-30 VITALS — BP 117/65 | HR 67 | Ht 69.0 in | Wt 195.0 lb

## 2021-12-30 DIAGNOSIS — Z8546 Personal history of malignant neoplasm of prostate: Secondary | ICD-10-CM

## 2021-12-30 DIAGNOSIS — N138 Other obstructive and reflux uropathy: Secondary | ICD-10-CM

## 2021-12-30 DIAGNOSIS — C61 Malignant neoplasm of prostate: Secondary | ICD-10-CM

## 2021-12-30 DIAGNOSIS — N401 Enlarged prostate with lower urinary tract symptoms: Secondary | ICD-10-CM | POA: Diagnosis not present

## 2021-12-30 LAB — PSA: Prostate Specific Ag, Serum: 0.1 ng/mL (ref 0.0–4.0)

## 2021-12-30 LAB — BLADDER SCAN AMB NON-IMAGING: Scan Result: 0

## 2021-12-30 NOTE — Progress Notes (Signed)
12/30/21 3:27 PM   [name] 1950-07-08 [id number]  Referring provider:  Frazier Richards, [place],  [place] [address] Chief Complaint  Patient presents with   Prostate Cancer      HPI: Christopher Mendez is a 71 y.o.male with a personal history of prostate cancer and urinary frequency , who presents today for 6 month follow-up with IPSS, PVR, and PSA.    He initially presented with a markedly elevated PSA to 56.7 with a rockhard nodular prostate consistent with clinical prostate cancer.  He also was in urinary retention unable to void spontaneously at the time.   He was started on degerelix and underwent multiple failed voiding trials.  Ultimately, he started on CIC and underwent prostate biopsy on 09/23/2017.   This revealed a 12 of 12 cores positive of high risk disease, Gleason 4+5 and 4+4 in all cores involving up to 100% of the tissue.  He finished 3 year course of ADT his last injection was on 10/30/2020.   His most recent PSA was 0.1 yesterday.   He reports that he is voiding beautifully this point in time.  He is able to empty his bladder.  He has no urgency or frequency.  No dysuria.  He is very pleased.    IPSS     Row Name 12/30/21 1500         International Prostate Symptom Score   How often have you had the sensation of not emptying your bladder? Not at All     How often have you had to urinate less than every two hours? Less than 1 in 5 times     How often have you found you stopped and started again several times when you urinated? Not at All     How often have you found it difficult to postpone urination? Not at All     How often have you had a weak urinary stream? Not at All     How often have you had to strain to start urination? Not at All     How many times did you typically get up at night to urinate? 2 Times     Total IPSS Score 3       Quality of Life due to urinary symptoms   If you were to spend the rest of your life  with your urinary condition just the way it is now how would you feel about that? Mostly Satisfied              Score:  1-7 Mild 8-19 Moderate 20-35 Severe  PMH: Past Medical History:  Diagnosis Date   Cardiac enlargement    COPD (chronic obstructive pulmonary disease) (HCC)    HTN (hypertension)     Surgical History: Past Surgical History:  Procedure Laterality Date   AMPUTATION TOE Right 07/14/2019   Procedure: AMPUTATION TOE RAY RIGHT 5TH PARTIAL;  Surgeon: Caroline More, DPM;  Location: [facility] ORS;  Service: Podiatry;  Laterality: Right;   EYE SURGERY      Home Medications:  Allergies as of 12/30/2021       Reactions   Flomax [tamsulosin Hcl] Rash   Myrbetriq [mirabegron] Rash        Medication List        Accurate as of December 30, 2021  3:27 PM. If you have any questions, ask your nurse or doctor.          albuterol 108 (90 Base)  MCG/ACT inhaler Commonly known as: VENTOLIN HFA Inhale 2 puffs into the lungs every 6 (six) hours as needed for wheezing or shortness of breath.   amLODipine 10 MG tablet Commonly known as: NORVASC Take 10 mg by mouth daily.   aspirin-acetaminophen-caffeine 250-250-65 MG tablet Commonly known as: EXCEDRIN MIGRAINE Take 3 tablets by mouth daily as needed (Body aches).   Fluticasone-Salmeterol 500-50 MCG/DOSE Aepb Commonly known as: ADVAIR Inhale 1 puff into the lungs 2 (two) times daily.   gabapentin 100 MG capsule Commonly known as: NEURONTIN 100 mg in the morning, at noon, and at bedtime.   hydrochlorothiazide 25 MG tablet Commonly known as: HYDRODIURIL Take 25 mg by mouth daily.   lisinopril 40 MG tablet Commonly known as: ZESTRIL Take 40 mg by mouth daily.   metFORMIN 1000 MG tablet Commonly known as: GLUCOPHAGE Take 1,000 mg by mouth 2 (two) times daily.   omeprazole 20 MG capsule Commonly known as: PRILOSEC Take 20 mg by mouth daily.   rosuvastatin 20 MG tablet Commonly known as: CRESTOR Take 20 mg by  mouth daily.   tiotropium 18 MCG inhalation capsule Commonly known as: SPIRIVA Place 18 mcg into inhaler and inhale daily.   triamcinolone cream 0.1 % Commonly known as: KENALOG Apply 1 application topically 2 (two) times daily as needed (dry skin).   vitamin C 1000 MG tablet Take 1,000 mg by mouth daily.        Allergies:  Allergies  Allergen Reactions   Flomax [Tamsulosin Hcl] Rash   Myrbetriq [Mirabegron] Rash    Family History: Family History  Problem Relation Age of Onset   Hypertension Mother    Diabetes Father     Social History:  reports that he has been smoking cigarettes. He has a 56.00 pack-year smoking history. He has never used smokeless tobacco. He reports current alcohol use of about 42.0 standard drinks of alcohol per week. He reports that he does not use drugs.   Physical Exam: BP 117/65   Pulse 67   Ht '5\' 9"'$  (1.753 m)   Wt 195 lb (88.5 kg)   BMI 28.80 kg/m   Constitutional:  Alert and oriented, No acute distress.  Opening by his wife today. HEENT: [place] AT, moist mucus membranes.  Trachea midline, no masses. Cardiovascular: No clubbing, cyanosis, or edema. Respiratory: Normal respiratory effort, no increased work of breathing. Skin: No rashes, bruises or suspicious lesions. Neurologic: Grossly intact, no focal deficits, moving all 4 extremities. Psychiatric: Normal mood and affect.  Laboratory Data:  Lab Results  Component Value Date   CREATININE 0.90 07/03/2019      Pertinent Imaging: Results for orders placed or performed in visit on 12/30/21  BLADDER SCAN AMB NON-IMAGING  Result Value Ref Range   Scan Result 0 ml      Assessment & Plan:    Prostate cancer  - No evidence of disease, PSA remains undetectable -No further ADT, continue to monitor PSA and a every 6 month basis   2. BPH with urinary obstruction  - He has symptomatically improved, no longer on any BPH or OAB meds   PSA only in 6 months, PSA with MD visit in a  year  I,Kailey Littlejohn,acting as a scribe for Hollice Espy, MD.,have documented all relevant documentation on the behalf of Hollice Espy, MD,as directed by  Hollice Espy, MD while in the presence of Hollice Espy, MD.  I have reviewed the above documentation for accuracy and completeness, and I agree with the above.  Hollice Espy, MD  [facility] [address], [place], [place] [address] [phone]

## 2022-02-19 ENCOUNTER — Inpatient Hospital Stay: Payer: Medicare Other

## 2022-02-20 ENCOUNTER — Inpatient Hospital Stay: Payer: Medicare Other | Attending: Oncology

## 2022-02-20 DIAGNOSIS — C61 Malignant neoplasm of prostate: Secondary | ICD-10-CM | POA: Insufficient documentation

## 2022-02-20 LAB — PSA: Prostatic Specific Antigen: 0.17 ng/mL (ref 0.00–4.00)

## 2022-02-25 ENCOUNTER — Ambulatory Visit
Admission: RE | Admit: 2022-02-25 | Discharge: 2022-02-25 | Disposition: A | Discharge status: Home / Self Care | Payer: Medicare Other | Source: Ambulatory Visit | Attending: Radiation Oncology | Admitting: Radiation Oncology

## 2022-02-25 VITALS — BP 156/60 | HR 59 | Temp 96.8°F | Resp 18 | Ht 69.0 in | Wt 196.5 lb

## 2022-02-25 DIAGNOSIS — Z923 Personal history of irradiation: Secondary | ICD-10-CM | POA: Diagnosis not present

## 2022-02-25 DIAGNOSIS — C61 Malignant neoplasm of prostate: Secondary | ICD-10-CM | POA: Diagnosis present

## 2022-02-25 NOTE — Progress Notes (Signed)
Radiation Oncology Follow up Note  Name: Christopher Mendez   Date:   02/25/2022 MRN:  [id number] DOB: [birth year]    This 71 y.o. male presents to the clinic today for 4-year follow-up status post IMRT radiation therapy to his prostate and pelvic nodes for Gleason 9 (4+5) adenocarcinoma presenting with a PSA of 56.  REFERRING PROVIDER: Frazier Richards, MD  HPI: Patient is a 71 year old male now out for years having completed IMRT radiation therapy for a Gleason 9 adenocarcinoma presenting with a PSA of 56.  Seen today in routine follow-up he is doing well.  He specifically [place] any increase in lower urinary tract symptoms diarrhea or fatigue.Marland Kitchen  His most recent PSA was 0.17 down from 0.243 years prior.  He specifically denies any bone pain.  COMPLICATIONS OF TREATMENT: none  FOLLOW UP COMPLIANCE: keeps appointments   PHYSICAL EXAM:  BP (!) 156/60   Pulse (!) 59   Temp (!) 96.8 F (36 C)   Resp 18   Ht '5\' 9"'$  (1.753 m)   Wt 196 lb 8 oz (89.1 kg)   BMI 29.02 kg/m  Well-developed well-nourished patient in NAD. HEENT reveals PERLA, EOMI, discs not visualized.  Oral cavity is clear. No oral mucosal lesions are identified. Neck is clear without evidence of cervical or supraclavicular adenopathy. Lungs are clear to A&P. Cardiac examination is essentially unremarkable with regular rate and rhythm without murmur rub or thrill. Abdomen is benign with no organomegaly or masses noted. Motor sensory and DTR levels are equal and symmetric in the upper and lower extremities. Cranial nerves II through XII are grossly intact. Proprioception is intact. No peripheral adenopathy or edema is identified. No motor or sensory levels are noted. Crude visual fields are within normal range.  RADIOLOGY RESULTS: No current films for review  PLAN: Present time patient is now out over 4 years with excellent biochemical control of his prostate cancer.  I am going to turn follow-up care over to his PMD.  He should have  yearly PSAs and I be happy to reevaluate him anytime should that be indicated.  Patient is to call with any concerns.  I would like to take this opportunity to thank you for allowing me to participate in the care of your patient.Noreene Filbert, MD

## 2022-05-13 ENCOUNTER — Inpatient Hospital Stay
Admission: EM | Admit: 2022-05-13 | Discharge: 2022-05-25 | DRG: 193 | Disposition: A | Discharge status: Home / Self Care | Payer: Medicare Other | Attending: Internal Medicine | Admitting: Internal Medicine

## 2022-05-13 DIAGNOSIS — J9602 Acute respiratory failure with hypercapnia: Secondary | ICD-10-CM | POA: Diagnosis present

## 2022-05-13 DIAGNOSIS — Z89421 Acquired absence of other right toe(s): Secondary | ICD-10-CM

## 2022-05-13 DIAGNOSIS — Z833 Family history of diabetes mellitus: Secondary | ICD-10-CM

## 2022-05-13 DIAGNOSIS — Z720 Tobacco use: Secondary | ICD-10-CM | POA: Diagnosis present

## 2022-05-13 DIAGNOSIS — R001 Bradycardia, unspecified: Secondary | ICD-10-CM | POA: Diagnosis not present

## 2022-05-13 DIAGNOSIS — Z1152 Encounter for screening for COVID-19: Secondary | ICD-10-CM

## 2022-05-13 DIAGNOSIS — E119 Type 2 diabetes mellitus without complications: Secondary | ICD-10-CM

## 2022-05-13 DIAGNOSIS — G9341 Metabolic encephalopathy: Secondary | ICD-10-CM | POA: Diagnosis present

## 2022-05-13 DIAGNOSIS — J101 Influenza due to other identified influenza virus with other respiratory manifestations: Secondary | ICD-10-CM

## 2022-05-13 DIAGNOSIS — J9601 Acute respiratory failure with hypoxia: Principal | ICD-10-CM

## 2022-05-13 DIAGNOSIS — I480 Paroxysmal atrial fibrillation: Secondary | ICD-10-CM | POA: Diagnosis present

## 2022-05-13 DIAGNOSIS — J1001 Influenza due to other identified influenza virus with the same other identified influenza virus pneumonia: Secondary | ICD-10-CM | POA: Diagnosis not present

## 2022-05-13 DIAGNOSIS — I110 Hypertensive heart disease with heart failure: Secondary | ICD-10-CM | POA: Diagnosis present

## 2022-05-13 DIAGNOSIS — J09X1 Influenza due to identified novel influenza A virus with pneumonia: Secondary | ICD-10-CM

## 2022-05-13 DIAGNOSIS — Z7901 Long term (current) use of anticoagulants: Secondary | ICD-10-CM

## 2022-05-13 DIAGNOSIS — Z79899 Other long term (current) drug therapy: Secondary | ICD-10-CM

## 2022-05-13 DIAGNOSIS — Z8249 Family history of ischemic heart disease and other diseases of the circulatory system: Secondary | ICD-10-CM

## 2022-05-13 DIAGNOSIS — I700 Atherosclerosis of aorta: Secondary | ICD-10-CM | POA: Diagnosis present

## 2022-05-13 DIAGNOSIS — J441 Chronic obstructive pulmonary disease with (acute) exacerbation: Secondary | ICD-10-CM

## 2022-05-13 DIAGNOSIS — I10 Essential (primary) hypertension: Secondary | ICD-10-CM | POA: Diagnosis present

## 2022-05-13 DIAGNOSIS — I2510 Atherosclerotic heart disease of native coronary artery without angina pectoris: Secondary | ICD-10-CM | POA: Diagnosis present

## 2022-05-13 DIAGNOSIS — E663 Overweight: Secondary | ICD-10-CM | POA: Diagnosis present

## 2022-05-13 DIAGNOSIS — I5033 Acute on chronic diastolic (congestive) heart failure: Secondary | ICD-10-CM | POA: Diagnosis present

## 2022-05-13 DIAGNOSIS — F05 Delirium due to known physiological condition: Secondary | ICD-10-CM | POA: Diagnosis not present

## 2022-05-13 DIAGNOSIS — I4891 Unspecified atrial fibrillation: Secondary | ICD-10-CM | POA: Diagnosis not present

## 2022-05-13 DIAGNOSIS — F17210 Nicotine dependence, cigarettes, uncomplicated: Secondary | ICD-10-CM | POA: Diagnosis present

## 2022-05-13 DIAGNOSIS — E8729 Other acidosis: Secondary | ICD-10-CM | POA: Diagnosis present

## 2022-05-13 DIAGNOSIS — Z6825 Body mass index (BMI) 25.0-25.9, adult: Secondary | ICD-10-CM

## 2022-05-13 DIAGNOSIS — Z7984 Long term (current) use of oral hypoglycemic drugs: Secondary | ICD-10-CM

## 2022-05-13 DIAGNOSIS — L89152 Pressure ulcer of sacral region, stage 2: Secondary | ICD-10-CM | POA: Diagnosis present

## 2022-05-13 DIAGNOSIS — Z888 Allergy status to other drugs, medicaments and biological substances status: Secondary | ICD-10-CM

## 2022-05-13 DIAGNOSIS — R451 Restlessness and agitation: Secondary | ICD-10-CM | POA: Diagnosis not present

## 2022-05-13 DIAGNOSIS — J440 Chronic obstructive pulmonary disease with acute lower respiratory infection: Secondary | ICD-10-CM | POA: Diagnosis present

## 2022-05-14 ENCOUNTER — Emergency Department: Payer: Medicare Other

## 2022-05-14 ENCOUNTER — Other Ambulatory Visit: Payer: Self-pay

## 2022-05-14 ENCOUNTER — Inpatient Hospital Stay: Payer: Medicare Other

## 2022-05-14 DIAGNOSIS — G9341 Metabolic encephalopathy: Secondary | ICD-10-CM | POA: Diagnosis present

## 2022-05-14 DIAGNOSIS — J9601 Acute respiratory failure with hypoxia: Principal | ICD-10-CM | POA: Diagnosis present

## 2022-05-14 DIAGNOSIS — I2510 Atherosclerotic heart disease of native coronary artery without angina pectoris: Secondary | ICD-10-CM | POA: Diagnosis present

## 2022-05-14 DIAGNOSIS — Z7901 Long term (current) use of anticoagulants: Secondary | ICD-10-CM | POA: Diagnosis not present

## 2022-05-14 DIAGNOSIS — I10 Essential (primary) hypertension: Secondary | ICD-10-CM | POA: Diagnosis present

## 2022-05-14 DIAGNOSIS — I700 Atherosclerosis of aorta: Secondary | ICD-10-CM | POA: Diagnosis present

## 2022-05-14 DIAGNOSIS — J1001 Influenza due to other identified influenza virus with the same other identified influenza virus pneumonia: Secondary | ICD-10-CM | POA: Diagnosis present

## 2022-05-14 DIAGNOSIS — Z888 Allergy status to other drugs, medicaments and biological substances status: Secondary | ICD-10-CM | POA: Diagnosis not present

## 2022-05-14 DIAGNOSIS — Z1152 Encounter for screening for COVID-19: Secondary | ICD-10-CM | POA: Diagnosis not present

## 2022-05-14 DIAGNOSIS — E663 Overweight: Secondary | ICD-10-CM | POA: Diagnosis present

## 2022-05-14 DIAGNOSIS — J440 Chronic obstructive pulmonary disease with acute lower respiratory infection: Secondary | ICD-10-CM | POA: Diagnosis present

## 2022-05-14 DIAGNOSIS — Z6825 Body mass index (BMI) 25.0-25.9, adult: Secondary | ICD-10-CM | POA: Diagnosis not present

## 2022-05-14 DIAGNOSIS — Z89421 Acquired absence of other right toe(s): Secondary | ICD-10-CM | POA: Diagnosis not present

## 2022-05-14 DIAGNOSIS — J09X1 Influenza due to identified novel influenza A virus with pneumonia: Secondary | ICD-10-CM | POA: Diagnosis not present

## 2022-05-14 DIAGNOSIS — I110 Hypertensive heart disease with heart failure: Secondary | ICD-10-CM | POA: Diagnosis present

## 2022-05-14 DIAGNOSIS — J441 Chronic obstructive pulmonary disease with (acute) exacerbation: Secondary | ICD-10-CM | POA: Diagnosis not present

## 2022-05-14 DIAGNOSIS — I5033 Acute on chronic diastolic (congestive) heart failure: Secondary | ICD-10-CM | POA: Diagnosis not present

## 2022-05-14 DIAGNOSIS — E119 Type 2 diabetes mellitus without complications: Secondary | ICD-10-CM | POA: Diagnosis present

## 2022-05-14 DIAGNOSIS — I4891 Unspecified atrial fibrillation: Secondary | ICD-10-CM | POA: Diagnosis not present

## 2022-05-14 DIAGNOSIS — J101 Influenza due to other identified influenza virus with other respiratory manifestations: Secondary | ICD-10-CM | POA: Diagnosis present

## 2022-05-14 DIAGNOSIS — J9602 Acute respiratory failure with hypercapnia: Secondary | ICD-10-CM | POA: Diagnosis present

## 2022-05-14 DIAGNOSIS — E8729 Other acidosis: Secondary | ICD-10-CM | POA: Diagnosis present

## 2022-05-14 DIAGNOSIS — F05 Delirium due to known physiological condition: Secondary | ICD-10-CM | POA: Diagnosis not present

## 2022-05-14 DIAGNOSIS — L89152 Pressure ulcer of sacral region, stage 2: Secondary | ICD-10-CM | POA: Diagnosis present

## 2022-05-14 DIAGNOSIS — F17210 Nicotine dependence, cigarettes, uncomplicated: Secondary | ICD-10-CM | POA: Diagnosis present

## 2022-05-14 DIAGNOSIS — Z8249 Family history of ischemic heart disease and other diseases of the circulatory system: Secondary | ICD-10-CM | POA: Diagnosis not present

## 2022-05-14 DIAGNOSIS — I480 Paroxysmal atrial fibrillation: Secondary | ICD-10-CM | POA: Diagnosis present

## 2022-05-14 LAB — COMPREHENSIVE METABOLIC PANEL
ALT: 9 U/L (ref 0–44)
AST: 22 U/L (ref 15–41)
Albumin: 3.9 g/dL (ref 3.5–5.0)
Alkaline Phosphatase: 45 U/L (ref 38–126)
Anion gap: 7 (ref 5–15)
BUN: 27 mg/dL — ABNORMAL HIGH (ref 8–23)
CO2: 27 mmol/L (ref 22–32)
Calcium: 8.9 mg/dL (ref 8.9–10.3)
Chloride: 105 mmol/L (ref 98–111)
Creatinine, Ser: 1.05 mg/dL (ref 0.61–1.24)
GFR, Estimated: >60 mL/min (ref >60)
Glucose, Bld: 125 mg/dL — ABNORMAL HIGH (ref 70–99)
Potassium: 4.3 mmol/L (ref 3.5–5.1)
Sodium: 139 mmol/L (ref 135–145)
Total Bilirubin: 1.1 mg/dL (ref 0.3–1.2)
Total Protein: 7 g/dL (ref 6.5–8.1)

## 2022-05-14 LAB — CBC WITH DIFFERENTIAL/PLATELET
Abs Immature Granulocytes: 0.03 10*3/uL (ref 0.00–0.07)
Basophils Absolute: 0.1 10*3/uL (ref 0.0–0.1)
Basophils Relative: 1 %
Eosinophils Absolute: 0 10*3/uL (ref 0.0–0.5)
Eosinophils Relative: 0 %
HCT: 47.2 % (ref 39.0–52.0)
Hemoglobin: 15 g/dL (ref 13.0–17.0)
Immature Granulocytes: 0 %
Lymphocytes Relative: 7 %
Lymphs Abs: 0.6 10*3/uL — ABNORMAL LOW (ref 0.7–4.0)
MCH: 30 pg (ref 26.0–34.0)
MCHC: 31.8 g/dL (ref 30.0–36.0)
MCV: 94.4 fL (ref 80.0–100.0)
Monocytes Absolute: 0.9 10*3/uL (ref 0.1–1.0)
Monocytes Relative: 10 %
Neutro Abs: 7.7 10*3/uL (ref 1.7–7.7)
Neutrophils Relative %: 82 %
Platelets: 186 10*3/uL (ref 150–400)
RBC: 5 MIL/uL (ref 4.22–5.81)
RDW: 16.2 % — ABNORMAL HIGH (ref 11.5–15.5)
WBC: 9.3 10*3/uL (ref 4.0–10.5)
nRBC: 0 % (ref 0.0–0.2)

## 2022-05-14 LAB — BLOOD GAS, ARTERIAL
Acid-Base Excess: 0.5 mmol/L (ref 0.0–2.0)
Acid-Base Excess: 0.8 mmol/L (ref 0.0–2.0)
Acid-Base Excess: 1.2 mmol/L (ref 0.0–2.0)
Bicarbonate: 30.3 mmol/L — ABNORMAL HIGH (ref 20.0–28.0)
Bicarbonate: 29.8 mmol/L — ABNORMAL HIGH (ref 20.0–28.0)
Bicarbonate: 29 mmol/L — ABNORMAL HIGH (ref 20.0–28.0)
Delivery systems: POSITIVE
Delivery systems: POSITIVE
Expiratory PAP: 6 cmH2O
Expiratory PAP: 6 cmH2O
FIO2: 40 %
FIO2: 40 %
Inspiratory PAP: 12 cmH2O
Inspiratory PAP: 16 cmH2O
O2 Content: 3 L/min
O2 Saturation: 97.3 %
O2 Saturation: 92.4 %
O2 Saturation: 93.2 %
Patient temperature: 37
Patient temperature: 37
Patient temperature: 37
pCO2 arterial: 74 mmHg (ref 32–48)
pCO2 arterial: 68 mmHg (ref 32–48)
pCO2 arterial: 59 mmHg — ABNORMAL HIGH (ref 32–48)
pH, Arterial: 7.22 — ABNORMAL LOW (ref 7.35–7.45)
pH, Arterial: 7.25 — ABNORMAL LOW (ref 7.35–7.45)
pH, Arterial: 7.3 — ABNORMAL LOW (ref 7.35–7.45)
pO2, Arterial: 96 mmHg (ref 83–108)
pO2, Arterial: 69 mmHg — ABNORMAL LOW (ref 83–108)
pO2, Arterial: 67 mmHg — ABNORMAL LOW (ref 83–108)

## 2022-05-14 LAB — URINALYSIS, COMPLETE (UACMP) WITH MICROSCOPIC
Bacteria, UA: NONE SEEN
Bilirubin Urine: NEGATIVE
Glucose, UA: NEGATIVE mg/dL
Hgb urine dipstick: NEGATIVE
Ketones, ur: 5 mg/dL — AB
Leukocytes,Ua: NEGATIVE
Nitrite: NEGATIVE
Protein, ur: 100 mg/dL — AB
Specific Gravity, Urine: 1.023 (ref 1.005–1.030)
pH: 5 (ref 5.0–8.0)

## 2022-05-14 LAB — BLOOD GAS, VENOUS
Acid-Base Excess: 0.5 mmol/L (ref 0.0–2.0)
Bicarbonate: 30.3 mmol/L — ABNORMAL HIGH (ref 20.0–28.0)
Delivery systems: POSITIVE
FIO2: 45 %
O2 Saturation: 52.3 %
Patient temperature: 37
pCO2, Ven: 74 mmHg (ref 44–60)
pH, Ven: 7.22 — ABNORMAL LOW (ref 7.25–7.43)
pO2, Ven: 38 mmHg (ref 32–45)

## 2022-05-14 LAB — CBG MONITORING, ED
Glucose-Capillary: 138 mg/dL — ABNORMAL HIGH (ref 70–99)
Glucose-Capillary: 110 mg/dL — ABNORMAL HIGH (ref 70–99)
Glucose-Capillary: 125 mg/dL — ABNORMAL HIGH (ref 70–99)

## 2022-05-14 LAB — PROCALCITONIN: Procalcitonin: 0.13 ng/mL

## 2022-05-14 LAB — HEMOGLOBIN A1C
Hgb A1c MFr Bld: 6 % — ABNORMAL HIGH (ref 4.8–5.6)
Mean Plasma Glucose: 126 mg/dL

## 2022-05-14 LAB — LACTIC ACID, PLASMA: Lactic Acid, Venous: 1.9 mmol/L (ref 0.5–1.9)

## 2022-05-14 LAB — MRSA NEXT GEN BY PCR, NASAL: MRSA by PCR Next Gen: NOT DETECTED

## 2022-05-14 LAB — RESP PANEL BY RT-PCR (RSV, FLU A&B, COVID)  RVPGX2
Influenza A by PCR: POSITIVE — AB
Influenza B by PCR: NEGATIVE
Resp Syncytial Virus by PCR: NEGATIVE
SARS Coronavirus 2 by RT PCR: NEGATIVE

## 2022-05-14 LAB — GLUCOSE, CAPILLARY: Glucose-Capillary: 111 mg/dL — ABNORMAL HIGH (ref 70–99)

## 2022-05-14 MED ORDER — INSULIN ASPART 100 UNIT/ML IJ SOLN
0.0000 [IU] | INTRAMUSCULAR | Status: DC
  Administered 2022-05-15 – 2022-05-16 (×3): 2 [IU] via SUBCUTANEOUS
  Filled 2022-05-14 (×3): qty 1

## 2022-05-14 MED ORDER — SODIUM CHLORIDE 0.9 % IV SOLN
500.0000 mg | INTRAVENOUS | Status: DC
  Administered 2022-05-14 – 2022-05-15 (×2): 500 mg via INTRAVENOUS
  Filled 2022-05-14 (×3): qty 5

## 2022-05-14 MED ORDER — SODIUM CHLORIDE 0.9 % IV BOLUS (SEPSIS): 1000.0000 mL | Freq: Once | INTRAVENOUS | Status: DC

## 2022-05-14 MED ORDER — ONDANSETRON HCL 4 MG/2ML IJ SOLN: 4.0000 mg | Freq: Three times a day (TID) | INTRAMUSCULAR | Status: DC | PRN

## 2022-05-14 MED ORDER — IPRATROPIUM-ALBUTEROL 0.5-2.5 (3) MG/3ML IN SOLN
3.0000 mL | Freq: Four times a day (QID) | RESPIRATORY_TRACT | Status: DC
  Administered 2022-05-14 – 2022-05-20 (×26): 3 mL via RESPIRATORY_TRACT
  Filled 2022-05-14 (×21): qty 3
  Filled 2022-05-14: qty 9
  Filled 2022-05-14 (×3): qty 3

## 2022-05-14 MED ORDER — METHYLPREDNISOLONE SODIUM SUCC 40 MG IJ SOLR
40.0000 mg | Freq: Two times a day (BID) | INTRAMUSCULAR | Status: DC
  Administered 2022-05-14 – 2022-05-15 (×3): 40 mg via INTRAVENOUS
  Filled 2022-05-14 (×3): qty 1

## 2022-05-14 MED ORDER — HYDROMORPHONE HCL 1 MG/ML IJ SOLN: 0.5000 mg | INTRAMUSCULAR | Status: DC | PRN

## 2022-05-14 MED ORDER — OSELTAMIVIR PHOSPHATE 75 MG PO CAPS
75.0000 mg | ORAL_CAPSULE | Freq: Two times a day (BID) | ORAL | Status: AC
  Administered 2022-05-15 – 2022-05-18 (×7): 75 mg via ORAL
  Filled 2022-05-14 (×10): qty 1

## 2022-05-14 MED ORDER — ONDANSETRON HCL 4 MG PO TABS: 4.0000 mg | ORAL_TABLET | Freq: Four times a day (QID) | ORAL | Status: DC | PRN

## 2022-05-14 MED ORDER — ONDANSETRON HCL 4 MG/2ML IJ SOLN
4.0000 mg | Freq: Four times a day (QID) | INTRAMUSCULAR | Status: DC | PRN
  Administered 2022-05-20: 4 mg via INTRAVENOUS
  Filled 2022-05-14: qty 2

## 2022-05-14 MED ORDER — ACETAMINOPHEN 500 MG PO TABS: 1000.0000 mg | ORAL_TABLET | Freq: Once | ORAL | Status: DC

## 2022-05-14 MED ORDER — CHLORHEXIDINE GLUCONATE CLOTH 2 % EX PADS
6.0000 | MEDICATED_PAD | Freq: Every day | CUTANEOUS | Status: DC
  Administered 2022-05-14 – 2022-05-25 (×12): 6 via TOPICAL

## 2022-05-14 MED ORDER — ALBUTEROL SULFATE (2.5 MG/3ML) 0.083% IN NEBU
5.0000 mg | INHALATION_SOLUTION | Freq: Once | RESPIRATORY_TRACT | Status: AC
  Administered 2022-05-14: 5 mg via RESPIRATORY_TRACT

## 2022-05-14 MED ORDER — SODIUM CHLORIDE 0.9 % IV SOLN: INTRAVENOUS | Status: AC

## 2022-05-14 MED ORDER — ALBUTEROL SULFATE (2.5 MG/3ML) 0.083% IN NEBU
INHALATION_SOLUTION | RESPIRATORY_TRACT | Status: AC
  Administered 2022-05-14: 5 mg via RESPIRATORY_TRACT
  Filled 2022-05-14: qty 6

## 2022-05-14 MED ORDER — ACETAMINOPHEN 650 MG RE SUPP
650.0000 mg | Freq: Four times a day (QID) | RECTAL | Status: DC | PRN
  Administered 2022-05-14 (×2): 650 mg via RECTAL
  Filled 2022-05-14 (×2): qty 2

## 2022-05-14 MED ORDER — LORAZEPAM 2 MG/ML IJ SOLN
1.0000 mg | Freq: Once | INTRAMUSCULAR | Status: AC
  Administered 2022-05-14: 1 mg via INTRAVENOUS
  Filled 2022-05-14: qty 1

## 2022-05-14 MED ORDER — IPRATROPIUM-ALBUTEROL 0.5-2.5 (3) MG/3ML IN SOLN
3.0000 mL | Freq: Four times a day (QID) | RESPIRATORY_TRACT | Status: DC | PRN
  Administered 2022-05-14: 3 mL via RESPIRATORY_TRACT
  Filled 2022-05-14: qty 3

## 2022-05-14 MED ORDER — SODIUM CHLORIDE 0.9 % IV SOLN
2.0000 g | INTRAVENOUS | Status: DC
  Administered 2022-05-14 (×2): 2 g via INTRAVENOUS
  Filled 2022-05-14 (×3): qty 20

## 2022-05-14 MED ORDER — ACETAMINOPHEN 325 MG PO TABS: 650.0000 mg | ORAL_TABLET | Freq: Four times a day (QID) | ORAL | Status: DC | PRN

## 2022-05-14 MED ORDER — MOMETASONE FURO-FORMOTEROL FUM 200-5 MCG/ACT IN AERO
2.0000 | INHALATION_SPRAY | Freq: Two times a day (BID) | RESPIRATORY_TRACT | Status: DC
  Filled 2022-05-14: qty 8.8

## 2022-05-14 MED ORDER — BUDESONIDE 0.25 MG/2ML IN SUSP
0.2500 mg | Freq: Two times a day (BID) | RESPIRATORY_TRACT | Status: DC
  Administered 2022-05-14 – 2022-05-25 (×22): 0.25 mg via RESPIRATORY_TRACT
  Filled 2022-05-14 (×22): qty 2

## 2022-05-14 MED ORDER — ENOXAPARIN SODIUM 40 MG/0.4ML IJ SOSY
40.0000 mg | PREFILLED_SYRINGE | INTRAMUSCULAR | Status: DC
  Administered 2022-05-14 – 2022-05-16 (×3): 40 mg via SUBCUTANEOUS
  Filled 2022-05-14 (×3): qty 0.4

## 2022-05-14 MED ORDER — ALBUTEROL SULFATE (2.5 MG/3ML) 0.083% IN NEBU: 2.5000 mg | INHALATION_SOLUTION | RESPIRATORY_TRACT | Status: DC | PRN

## 2022-05-14 NOTE — ED Triage Notes (Signed)
Pt arrives from home via [facility].  C/O respiratory distress with O2 sats in 60s on RA. 88% on 15L.  Bilateral wheezes noted.  Increased AMS and sickness x 1 week with productive cough. Oral temp 101.4.   Strong UTI smell noted by EMS.  Witnessed fall reported today.  Denies hitting head. Pt denies pain and is A&Ox4 at this time.

## 2022-05-14 NOTE — ED Notes (Signed)
Attempted to call report on pt. To step down. Step down states nurses, "are still in report, and do not want to be interrupted." Report given to Estill Bamberg, RN to give report when step down availible.

## 2022-05-14 NOTE — H&P (Addendum)
History and Physical    Patient: Christopher Mendez YBO:[id number] DOB: May 12, 1951 DOA: 05/13/2022 DOS: the patient was seen and examined on 05/14/2022 PCP: Frazier Richards, MD  Patient coming from: Home  Chief Complaint:  Chief Complaint  Patient presents with   Respiratory Distress    Most of the history is obtained from patient's wife over the phone as patient is unable to provide any history HPI: Christopher Mendez is a 71 y.o. male with medical history significant for COPD and hypertension who was brought into the ER by EMS for evaluation of weakness, fall and respiratory distress. Patient's wife states that they both have been sick for a few days with symptoms of a respiratory illness which include cough, fever and chills.  She notes that 1 day prior to his admission that her husband became very weak and extremely confused.  According to his wife he fell the night prior to his admission which prompted her to call EMS. When EMS arrived patient was noted to be in respiratory distress with room air pulse oximetry in the 60s.  He was placed on nonrebreather mask with improvement in his pulse oximetry to 88%.  He was febrile with a Tmax of 101.4.  Patient's wife states that she does not know if he hit his head but per EMS documentation patient is awake, alert and oriented to person place and time. During my evaluation patient is very lethargic and does not respond to verbal stimuli.  He attempts to withdraw from painful stimuli.  He has a wet sounding cough and is tachypneic on the cardiac monitor. Chart review shows patient got a dose of Ativan 1 mg, Dilaudid as needed for pain, bronchodilator therapy, IV Rocephin and Zithromax. I am unable to do review of systems due to his mental status.   Review of Systems: unable to review all systems due to the inability of the patient to answer questions. Past Medical History:  Diagnosis Date   Cardiac enlargement    COPD (chronic obstructive pulmonary  disease) (HCC)    HTN (hypertension)    Past Surgical History:  Procedure Laterality Date   AMPUTATION TOE Right 07/14/2019   Procedure: AMPUTATION TOE RAY RIGHT 5TH PARTIAL;  Surgeon: Caroline More, DPM;  Location: [facility] ORS;  Service: Podiatry;  Laterality: Right;   EYE SURGERY     Social History:  reports that he has been smoking cigarettes. He has a 56.00 pack-year smoking history. He has never used smokeless tobacco. He reports current alcohol use of about 42.0 standard drinks of alcohol per week. He reports that he does not use drugs.  Allergies  Allergen Reactions   Flomax [Tamsulosin Hcl] Rash   Myrbetriq [Mirabegron] Rash    Family History  Problem Relation Age of Onset   Hypertension Mother    Diabetes Father     Prior to Admission medications   Medication Sig Start Date End Date Taking? Authorizing Provider  albuterol (VENTOLIN HFA) 108 (90 Base) MCG/ACT inhaler Inhale 2 puffs into the lungs every 6 (six) hours as needed for wheezing or shortness of breath.   Yes [provider]  amLODipine (NORVASC) 10 MG tablet Take 10 mg by mouth daily. 12/12/20  Yes [provider]  Ascorbic Acid (VITAMIN C) 1000 MG tablet Take 1,000 mg by mouth daily.   Yes [provider]  aspirin-acetaminophen-caffeine (EXCEDRIN MIGRAINE) [phone] MG tablet Take 3 tablets by mouth daily as needed (Body aches).   Yes [provider]  cholecalciferol (VITAMIN  D3) 25 MCG (1000 UNIT) tablet Take 1,000 Units by mouth daily.   Yes [provider]  Fluticasone-Salmeterol (ADVAIR) 500-50 MCG/DOSE AEPB Inhale 1 puff into the lungs 2 (two) times daily.   Yes [provider]  hydrochlorothiazide (HYDRODIURIL) 25 MG tablet Take 25 mg by mouth daily.   Yes [provider]  ipratropium-albuterol (DUONEB) 0.5-2.5 (3) MG/3ML SOLN Take 3 mLs by nebulization every 6 (six) hours as needed. 03/17/22  Yes [provider]  lisinopril  (PRINIVIL,ZESTRIL) 40 MG tablet Take 40 mg by mouth daily.   Yes [provider]  Melatonin 10 MG CAPS Take 10 mg by mouth at bedtime as needed.   Yes [provider]  metFORMIN (GLUCOPHAGE) 1000 MG tablet Take 1,000 mg by mouth 2 (two) times daily. 01/28/21  Yes [provider]  omeprazole (PRILOSEC) 20 MG capsule Take 20 mg by mouth daily.   Yes [provider]  tiotropium (SPIRIVA) 18 MCG inhalation capsule Place 18 mcg into inhaler and inhale daily.   Yes [provider]  gabapentin (NEURONTIN) 100 MG capsule 100 mg in the morning, at noon, and at bedtime. Patient not taking: Reported on 05/14/2022 10/31/19   [provider]  rosuvastatin (CRESTOR) 20 MG tablet Take 20 mg by mouth daily. Patient not taking: Reported on 05/14/2022 10/28/20   [provider]  triamcinolone cream (KENALOG) 0.1 % Apply 1 application topically 2 (two) times daily as needed (dry skin).  Patient not taking: Reported on 05/14/2022    [provider]    Physical Exam: Vitals:   05/14/22 0815 05/14/22 0830 05/14/22 0845 05/14/22 0900  BP:  115/60    Pulse: 79 79 83 86  Resp: 20 (!) 21 (!) 23 (!) 21  Temp:      TempSrc:      SpO2: 94% 92% 95% 94%  Weight:      Height:       Physical Exam Vitals and nursing note reviewed.  Constitutional:      Comments: Lethargic and only responds  to painful stimuli.  HENT:     Head: Normocephalic.     Nose: Nose normal.     Mouth/Throat:     Mouth: Mucous membranes are moist.  Eyes:     Conjunctiva/sclera: Conjunctivae normal.  Cardiovascular:     Rate and Rhythm: Normal rate and regular rhythm.  Pulmonary:     Breath sounds: Rhonchi present.  Abdominal:     General: Abdomen is flat. Bowel sounds are normal.     Palpations: Abdomen is soft.  Musculoskeletal:        General: Normal range of motion.     Cervical back: Normal range of motion and neck supple.  Skin:    General: Skin is warm and  dry.  Neurological:     Motor: Weakness present.  Psychiatric:     Comments: Unable to assess     Data Reviewed: Relevant notes from primary care and specialist visits, past discharge summaries as available in EHR, including Care Everywhere. Prior diagnostic testing as pertinent to current admission diagnoses Updated medications and problem lists for reconciliation ED course, including vitals, labs, imaging, treatment and response to treatment Triage notes, nursing and pharmacy notes and ED provider's notes Notable results as noted in HPI Labs reviewed.  Sodium 139, potassium 4.3, chloride 105, bicarb 27, glucose 125, BUN 27, creatinine 1.05, calcium 8.9, total protein 7.0, albumin 3.9, AST 22, ALT 9, alkaline phosphatase 45, total bilirubin 1.1, lactic  acid 1.9, white count 9.3, hemoglobin 15.0, hematocrit 47.2, platelet count 186 Influenza A PCR is positive VBG 7.22/74/38/30.3 CT scan of the head without contrast shows no acute intracranial findings Chest x-ray reviewed by me shows Central pulmonary vascular congestion. No lung consolidation.  Twelve-lead EKG reviewed by me shows sinus rhythm with a left anterior fascicular block There are no new results to review at this time.  Assessment and Plan: * Influenza A with pneumonia Patient presents for evaluation of altered mental status and weakness and his influenza A PCR is positive He also has a wet sounding cough Will start patient on Tamiflu Place patient on IV Solu-Medrol Obtain procalcitonin levels to rule out superimposed bacterial infection Place patient empirically on Rocephin and Zithromax while awaiting results of procalcitonin level Follow-up results of CT scan of the chest without contrast  Acute metabolic encephalopathy Patient noted to be very lethargic and only responds to deep sternal rub by withdrawing from pain At baseline he is usually awake, alert and oriented to person place and time Altered mental status  appears to be multifactorial and secondary to hypercapnia as well as from sedating agents patient received for agitation Will obtain a stat arterial blood gas Discontinue all sedating medications Initial CT scan of the head is negative for any acute findings Will obtain MRI of the brain if patient's mental status does not improve following resolution of hypercapnia   Acute hypercapnic respiratory failure ([place]) Patient was brought into the ER by EMS for evaluation of respiratory distress and had room air pulse oximetry of 65% per EMS VBG done on admission showed uncompensated respiratory acidosis with hypercapnia and patient was placed on BiPAP During my evaluation he is currently off BiPAP on 3 L but is unresponsive We will repeat an arterial blood gas now We will request critical care consult as patient may require intubation if hypercapnia is persistent  Controlled type 2 diabetes mellitus without complication, without long-term current use of insulin ([place]) Check blood sugars every 4 hours while patient is n.p.o. Hold metformin  HTN (hypertension) Hold oral antihypertensive agents Place patient on as needed hydralazine for systolic blood pressure greater than 160  COPD (chronic obstructive pulmonary disease) (HCC) Acute exacerbation secondary to influenza A infection Continue bronchodilator therapy and inhaled steroids      Advance Care Planning:   Code Status: Full Code   Consults: Critical care  Family Communication: Greater than 50% of time was spent discussing patient's condition and plan of care with his wife Logyn Kendrick over the phone.  All questions and concerns have been addressed.  She verbalizes understanding and agrees with the plan.  CODE STATUS was discussed and she wanted to be a full code  Severity of Illness: The appropriate patient status for this patient is INPATIENT. Inpatient status is judged to be reasonable and necessary in order to provide the required  intensity of service to ensure the patient's safety. The patient's presenting symptoms, physical exam findings, and initial radiographic and laboratory data in the context of their chronic comorbidities is felt to place them at high risk for further clinical deterioration. Furthermore, it is not anticipated that the patient will be medically stable for discharge from the hospital within 2 midnights of admission.   * I certify that at the point of admission it is my clinical judgment that the patient will require inpatient hospital care spanning beyond 2 midnights from the point of admission due to high intensity of service, high risk for further deterioration  and high frequency of surveillance required.*  Author: Collier Bullock, MD 05/14/2022 9:45 AM  For on call review [email or web address].

## 2022-05-14 NOTE — ED Notes (Signed)
Intensivist at bedside, states pt. Should be on bipap STAT, notified MD that RT had already been called for ABG, and will be requested for Bipap.

## 2022-05-14 NOTE — ED Notes (Signed)
Given by EMS:  2g mag sulfate, 125 solumedrol, 1 duoneb, 1 albuterol, 1L LR, 260m NS.

## 2022-05-14 NOTE — Consult Note (Signed)
NAME:  Christopher Mendez, MRN:  [id number], DOB:  07-25-1950, LOS: 0 ADMISSION DATE:  05/13/2022, CONSULTATION DATE: 05/14/2022 REFERRING MD: Dr. Francine Graven, CHIEF COMPLAINT: Acute respiratory distress  Brief Pt Description / Synopsis:  71 year old male admitted with acute hypoxic and hypercapnic respiratory failure in the setting of acute COPD exacerbation due to Influenza A infection requiring BiPAP.  History of Present Illness:  Christopher Mendez is a 71 year old male with a past medical history significant for COPD and hypertension who presented to [facility] ED on 05/14/2022 due to acute respiratory distress, weakness, and fall.  Patient is currently lethargic and unable to contribute to history and no family is currently available, therefore history is obtained from ED and nursing notes.  Per chart review, the patient and his wife had been sick for a few days with upper respiratory symptoms including cough, fever, chills.  1 day prior to presentation the husband became very weak and extremely confused, and subsequently fell later that night, prompting her to activate EMS.  Upon EMS arrival he was noted to be in respiratory distress and hypoxic with O2 sats in the 60s.  He was placed on nonrebreather mask with improvement and O2 sats to 88%.  He was also noted to be febrile with temperature of 101.4.  ED Course: Initial Vital Signs: Temperature 98.8 F, pulse 86, respiratory rate 22, blood pressure 113/53, SpO2 93% Significant Labs: Glucose 125, lactic acid 1.9, procalcitonin 0.13, WBC 9.3 Positive for influenza A VBG on BiPAP: pH 7.22/pCO2 74/pO2 38/bicarb 30.3 Imaging Chest X-ray>>FINDINGS: There is central pulmonary vascular congestion. There is no lung consolidation, pleural effusion or pneumothorax. There is minimal atelectasis in the left lung base. Cardiomediastinal silhouette is within normal limits. No acute fractures are seen. CT Chest w/o Contrast>>IMPRESSION: Mild right posterior  basilar subsegmental atelectasis or infiltrate is noted. Extensive coronary artery calcifications are noted suggesting coronary disease. Aortic Atherosclerosis CT Head w/o contrast>>IMPRESSION: No acute intracranial process. Medications Administered: EMS gave 2 g mag sulfate, 125 Solu-Medrol, 1 DuoNeb, 1.2 L LR bolus  In ED: 1 mg of Ativan, bronchodilators, IV ceftriaxone and Zithromax  Given his work of breathing he was placed on BiPAP.  Hospitalist were asked to admit the patient for further workup and treatment.  PCCM was asked to evaluate the patient given high risk for intubation.  Please see "significant hospital events" section below for full detailed hospital course.  Pertinent  Medical History   Past Medical History:  Diagnosis Date   Cardiac enlargement    COPD (chronic obstructive pulmonary disease) ([place])    HTN (hypertension)     Micro Data:  12/21: SARS-CoV-2 PCR>> negative 12/21: + Influenza A 12/21: Blood culture x 2>> 12/21: Urine>>  Antimicrobials:  Azithromycin 12/21>> Ceftriaxone 12/21>> Oseltamivir 12/21>>  Significant Hospital Events: Including procedures, antibiotic start and stop dates in addition to other pertinent events   12/21: Admitted by hospitalist.  Requiring BiPAP.  ABG with hypercapnia, high risk for intubation.  PCCM consulted.  Interim History / Subjective:  -Patient previously placed on BiPAP due to acute respiratory distress, required 1 mg of Ativan to help him tolerate BiPAP -Reported by hospitalist that he was somnolent with gurgling respirations, concern for inability to tolerate BiPAP potentially requiring intubation~PCCM asked to consult -Upon PCCM evaluation in the ED, patient is lethargic but arouses to pain, feel that he can tolerate a trial of BiPAP and will follow-up ABG following ~PCCM will continue to follow as he is he does remain high intubation risk -Hemodynamically  stable  Objective   Blood pressure 115/60, pulse 86,  temperature 99.5 F (37.5 C), temperature source Axillary, resp. rate (!) 21, height '5\' 9"'$  (1.753 m), weight 92.2 kg, SpO2 94 %.    FiO2 (%):  [45 %-60 %] 45 %   Intake/Output Summary (Last 24 hours) at 05/14/2022 0945 Last data filed at 05/13/2022 2358 Gross per 24 hour  Intake 1200 ml  Output --  Net 1200 ml   Filed Weights   05/14/22 0012  Weight: 92.2 kg    Examination: General: Acute on chronically ill-appearing male, laying in bed, on nasal cannula, no acute distress HENT: Atraumatic, normocephalic, neck supple, no JVD Lungs: Mild expiratory wheezing to bilateral upper zones, diminished at the bases, even, mild tachypnea Cardiovascular: Regular rate and rhythm, S1-S2, no murmurs, rubs, gallops Abdomen: Soft, nontender, nondistended, no guarding or rebound tenderness, bowel sounds positive x 4 Extremities: Normal bulk and tone, no deformities, no edema Neuro: Lethargic, moans and withdraws from pain, does not follow commands, pupils PERRLA GU: External male catheter in place draining dark yellow urine  Resolved Hospital Problem list     Assessment & Plan:   Acute Hypoxic & Hypercapnic Respiratory Failure in setting of AECOPD due to Influenza Pneumonia -Supplemental O2 as needed to maintain O2 sats 88 to 92% -BiPAP, wean as tolerated -High risk for intubation -Follow intermittent Chest X-ray & ABG as needed -Bronchodilators & Pulmicort nebs -IV Steroids -ABX as above -Diuresis as BP and renal function permits -Pulmonary toilet as able  Influenza A pneumonia ? Superimposed CAP -Monitor fever curve -Trend WBC's & Procalcitonin -Follow cultures as above -Continue empiric azithromycin, ceftriaxone, and Tamiflu pending cultures & sensitivities  Acute metabolic encephalopathy, suspect due to CO2 narcosis -Treatment of hypercapnia and metabolic derangements as outlined above -Provide supportive care -Avoid sedating meds as able -CT head negative for any acute  findings   Best Practice (right click and "Reselect all SmartList Selections" daily)   Diet/type: NPO DVT prophylaxis: LMWH GI prophylaxis: N/A Lines: N/A Foley:  N/A Code Status:  full code Last date of multidisciplinary goals of care discussion [N/A]  Labs   CBC: Recent Labs  Lab 05/14/22 0002  WBC 9.3  NEUTROABS 7.7  HGB 15.0  HCT 47.2  MCV 94.4  PLT 409    Basic Metabolic Panel: Recent Labs  Lab 05/14/22 0002  NA 139  K 4.3  CL 105  CO2 27  GLUCOSE 125*  BUN 27*  CREATININE 1.05  CALCIUM 8.9   GFR: Estimated Creatinine Clearance: 70.3 mL/min (by C-G formula based on SCr of 1.05 mg/dL). Recent Labs  Lab 05/14/22 0002  WBC 9.3  LATICACIDVEN 1.9    Liver Function Tests: Recent Labs  Lab 05/14/22 0002  AST 22  ALT 9  ALKPHOS 45  BILITOT 1.1  PROT 7.0  ALBUMIN 3.9   No results for input(s): "LIPASE", "AMYLASE" in the last 168 hours. No results for input(s): "AMMONIA" in the last 168 hours.  ABG    Component Value Date/Time   HCO3 30.3 (H) 05/14/2022 0003   O2SAT 52.3 05/14/2022 0003     Coagulation Profile: No results for input(s): "INR", "PROTIME" in the last 168 hours.  Cardiac Enzymes: No results for input(s): "CKTOTAL", "CKMB", "CKMBINDEX", "TROPONINI" in the last 168 hours.  HbA1C: No results found for: "HGBA1C"  CBG: No results for input(s): "GLUCAP" in the last 168 hours.  Review of Systems:   Unable to assess due to AMS   Past Medical History:  He,  has a past medical history of Cardiac enlargement, COPD (chronic obstructive pulmonary disease) ([place]), and HTN (hypertension).   Surgical History:   Past Surgical History:  Procedure Laterality Date   AMPUTATION TOE Right 07/14/2019   Procedure: AMPUTATION TOE RAY RIGHT 5TH PARTIAL;  Surgeon: Caroline More, DPM;  Location: [facility] ORS;  Service: Podiatry;  Laterality: Right;   EYE SURGERY       Social History:   reports that he has been smoking cigarettes. He has a 56.00  pack-year smoking history. He has never used smokeless tobacco. He reports current alcohol use of about 42.0 standard drinks of alcohol per week. He reports that he does not use drugs.   Family History:  His family history includes Diabetes in his father; Hypertension in his mother.   Allergies Allergies  Allergen Reactions   Flomax [Tamsulosin Hcl] Rash   Myrbetriq [Mirabegron] Rash     Home Medications  Prior to Admission medications   Medication Sig Start Date End Date Taking? Authorizing Provider  albuterol (VENTOLIN HFA) 108 (90 Base) MCG/ACT inhaler Inhale 2 puffs into the lungs every 6 (six) hours as needed for wheezing or shortness of breath.   Yes [provider]  amLODipine (NORVASC) 10 MG tablet Take 10 mg by mouth daily. 12/12/20  Yes [provider]  Ascorbic Acid (VITAMIN C) 1000 MG tablet Take 1,000 mg by mouth daily.   Yes [provider]  aspirin-acetaminophen-caffeine (EXCEDRIN MIGRAINE) [phone] MG tablet Take 3 tablets by mouth daily as needed (Body aches).   Yes [provider]  cholecalciferol (VITAMIN D3) 25 MCG (1000 UNIT) tablet Take 1,000 Units by mouth daily.   Yes [provider]  Fluticasone-Salmeterol (ADVAIR) 500-50 MCG/DOSE AEPB Inhale 1 puff into the lungs 2 (two) times daily.   Yes [provider]  hydrochlorothiazide (HYDRODIURIL) 25 MG tablet Take 25 mg by mouth daily.   Yes [provider]  ipratropium-albuterol (DUONEB) 0.5-2.5 (3) MG/3ML SOLN Take 3 mLs by nebulization every 6 (six) hours as needed. 03/17/22  Yes [provider]  lisinopril (PRINIVIL,ZESTRIL) 40 MG tablet Take 40 mg by mouth daily.   Yes [provider]  Melatonin 10 MG CAPS Take 10 mg by mouth at bedtime as needed.   Yes [provider]  metFORMIN (GLUCOPHAGE) 1000 MG tablet Take 1,000 mg by mouth 2 (two) times daily. 01/28/21  Yes [provider]  omeprazole (PRILOSEC) 20 MG capsule Take  20 mg by mouth daily.   Yes [provider]  tiotropium (SPIRIVA) 18 MCG inhalation capsule Place 18 mcg into inhaler and inhale daily.   Yes [provider]  gabapentin (NEURONTIN) 100 MG capsule 100 mg in the morning, at noon, and at bedtime. Patient not taking: Reported on 05/14/2022 10/31/19   [provider]  rosuvastatin (CRESTOR) 20 MG tablet Take 20 mg by mouth daily. Patient not taking: Reported on 05/14/2022 10/28/20   [provider]  triamcinolone cream (KENALOG) 0.1 % Apply 1 application topically 2 (two) times daily as needed (dry skin).  Patient not taking: Reported on 05/14/2022    [provider]     Critical care time: 45 minutes     Darel Hong, AGACNP-BC [facility] epic messenger for cross cover needs If after hours, please call E-link

## 2022-05-14 NOTE — Progress Notes (Signed)
CODE SEPSIS - PHARMACY COMMUNICATION  **Broad Spectrum Antibiotics should be administered within 1 hour of Sepsis diagnosis**  Time Code Sepsis Called/Page Received: 12/21 @ 0001   Antibiotics Ordered: Ceftriaxone, Azithromycin   Time of 1st antibiotic administration: Ceftriaxone 2 gm IV X 1 on 12/21 @ 1800.   Additional action taken by pharmacy:   If necessary, Name of Provider/Nurse Contacted:     Bernis Stecher D ,PharmD Clinical Pharmacist  05/14/2022  12:54 AM

## 2022-05-14 NOTE — Assessment & Plan Note (Addendum)
Acute exacerbation secondary to influenza A infection Continue bronchodilator therapy and inhaled steroids

## 2022-05-14 NOTE — ED Notes (Signed)
Daughter in room at this time states that pt fell today out of chair while trying to take off shoe.  Unwitnessed fall/unknown if he hit his head. Pt has been 'seeing things' starting today prior to fall.  Pt has not been sick recently or this past week as EMS reported. EDP Joni Fears made aware.

## 2022-05-14 NOTE — ED Notes (Signed)
RT called, pt's O2 sat 88%, will increase FiO2 back up to 40%.

## 2022-05-14 NOTE — ED Notes (Signed)
RT at bedside, pt. On bipap.

## 2022-05-14 NOTE — Assessment & Plan Note (Addendum)
Improved on BiPAP and currently on high flow oxygen.  Recheck ABG in the morning.  Appreciate cardiology help.

## 2022-05-14 NOTE — ED Notes (Addendum)
Receiving RN Gregary Cromer has agreed to accept [facility] once pt has arrived to inpatient unit, all questions and concerns address.

## 2022-05-14 NOTE — ED Notes (Addendum)
This RN to bedside with PCT. Pt's linens changed, pt. Remained eyes closed for entire process with groans when rolled. Pt. Did not speak to staff when spoken to. Pt. Has very wet, congested cough, temp is 100.2 rectal. Alertness is questionable. Will notify MD. 3L humidified [place] O2 maintained. Pt. Sitting up in stretcher, still sleeping.

## 2022-05-14 NOTE — ED Notes (Signed)
Called RT to assist this RN with transporting pt since he is on BiPAP. RT states will be to bedside in about 10 minutes.

## 2022-05-14 NOTE — Progress Notes (Signed)
BIPAP not indicated at this time. Placed patient on 5l [place], no distress noted.

## 2022-05-14 NOTE — Assessment & Plan Note (Addendum)
Patient presents for evaluation of altered mental status and weakness and his influenza A PCR is positive.  Procalcitonin essentially normal.  Will discontinue antibiotics.  CT scan of chest has atelectasis versus infiltrate

## 2022-05-14 NOTE — ED Provider Notes (Signed)
[facility] Provider Note    Event Date/Time   First MD Initiated Contact with Patient 05/14/22 0001     (approximate)   History   Chief Complaint: Respiratory Distress   HPI  Christopher Mendez is a 71 y.o. male with a history of COPD who comes ED complaining of respiratory distress, shortness of breath and generalized weakness that is been worsening for a week.  Has a productive cough.  EMS notes that initial oxygen saturation was about 65%, requiring 15 L nonrebreather.  With bronchodilators and magnesium and Solu-Medrol IV given by EMS, they have noticed improvement in his work of breathing and oxygenation, still 88% on 15 L.  Patient denies pain.  No falls or trauma.  P/w resp distress, wheezing, 65% RA. Acc m use, breath stacking, tachypnea, incr. E:I.  Other exam benign  Solumedrol and mag and BDs and 1256m IVF by EMS  Bipap, abx, sepsis workup      Physical Exam   Triage Vital Signs: ED Triage Vitals [05/13/22 2358]  Enc Vitals Group     BP      Pulse      Resp      Temp      Temp src      SpO2 (!) 65 %     Weight      Height      Head Circumference      Peak Flow      Pain Score      Pain Loc      Pain Edu?      Excl. in [place]     Most recent vital signs: Vitals:   05/14/22 0250 05/14/22 0300  BP: (!) 113/53 (!) 115/50  Pulse: 86 84  Resp: (!) 22 (!) 27  Temp: 98.8 F (37.1 C)   SpO2: 93% 93%    General: Awake, respiratory distress.  CV:  Good peripheral perfusion.  Regular rate rhythm Resp:  Tachypnea, respiratory rate of 30.  Prolonged expiratory phase, diffuse expiratory wheezing.  Accessory muscle use.  Appears to be breath stacking. Abd:  No distention.  Soft nontender Other:  No edema.   ED Results / Procedures / Treatments   Labs (all labs ordered are listed, but only abnormal results are displayed) Labs Reviewed  RESP PANEL BY RT-PCR (RSV, FLU A&B, COVID)  RVPGX2 - Abnormal; Notable for the following  components:      Result Value   Influenza A by PCR POSITIVE (*)    All other components within normal limits  COMPREHENSIVE METABOLIC PANEL - Abnormal; Notable for the following components:   Glucose, Bld 125 (*)    BUN 27 (*)    All other components within normal limits  CBC WITH DIFFERENTIAL/PLATELET - Abnormal; Notable for the following components:   RDW 16.2 (*)    Lymphs Abs 0.6 (*)    All other components within normal limits  URINALYSIS, COMPLETE (UACMP) WITH MICROSCOPIC - Abnormal; Notable for the following components:   Color, Urine YELLOW (*)    APPearance HAZY (*)    Ketones, ur 5 (*)    Protein, ur 100 (*)    All other components within normal limits  BLOOD GAS, VENOUS - Abnormal; Notable for the following components:   pH, Ven 7.22 (*)    pCO2, Ven 74 (*)    Bicarbonate 30.3 (*)    All other components within normal limits  CULTURE, BLOOD (ROUTINE X 2)  CULTURE, BLOOD (ROUTINE X  2)  URINE CULTURE  LACTIC ACID, PLASMA     EKG Interpreted by me Sinus rhythm rate of 96.  Left axis, normal intervals.  Poor R wave progression.  Normal ST segments and T waves.   RADIOLOGY Chest x-ray interpreted by me, appears normal.  Radiology report reviewed.  CT head unremarkable.   PROCEDURES:  .Critical Care  Performed by: Carrie Mew, MD Authorized by: Carrie Mew, MD   Critical care provider statement:    Critical care time (minutes):  35   Critical care time was exclusive of:  Separately billable procedures and treating other patients   Critical care was necessary to treat or prevent imminent or life-threatening deterioration of the following conditions:  Sepsis and respiratory failure   Critical care was time spent personally by me on the following activities:  Development of treatment plan with patient or surrogate, discussions with consultants, evaluation of patient's response to treatment, examination of patient, obtaining history from patient or  surrogate, ordering and performing treatments and interventions, ordering and review of laboratory studies, ordering and review of radiographic studies, pulse oximetry, re-evaluation of patient's condition and review of old charts   Care discussed with: admitting provider      [place] ED: Medications  cefTRIAXone (ROCEPHIN) 2 g in sodium chloride 0.9 % 100 mL IVPB (0 g Intravenous Stopped 05/14/22 0050)  azithromycin (ZITHROMAX) 500 mg in sodium chloride 0.9 % 250 mL IVPB (0 mg Intravenous Stopped 05/14/22 0131)  acetaminophen (TYLENOL) tablet 1,000 mg (1,000 mg Oral Not Given 05/14/22 0141)  albuterol (PROVENTIL) (2.5 MG/3ML) 0.083% nebulizer solution 5 mg (5 mg Nebulization Given 05/14/22 0040)  LORazepam (ATIVAN) injection 1 mg (1 mg Intravenous Given 05/14/22 0142)     IMPRESSION / MDM / [place] / ED COURSE  I reviewed the triage vital signs and the nursing notes.                              Differential diagnosis includes, but is not limited to, COPD exacerbation, pneumonia, pulmonary edema, pleural effusion, non-STEMI, viral illness, AKI, electrolyte abnormality  Patient's presentation is most consistent with acute presentation with potential threat to life or bodily function.  Patient presents with sepsis, no signs of shock currently with normal blood pressure of about 150/90 on arrival.  Will start sepsis workup, give ceftriaxone and azithromycin for antibiotic coverage.  Continue albuterol.  Start BiPAP for respiratory support, hopefully will be able to wean within a few hours.  Will need to admit.   Clinical Course as of 05/14/22 0406  Thu May 14, 2022  0033 Blood gas, venous(!!) Acute respiratory acidosis from co2 retention. Continue bipap [PS]    Clinical Course User Index [PS] Carrie Mew, MD    ----------------------------------------- 4:06 AM on 05/14/2022 ----------------------------------------- Vitals remained stable.   Clinically improving.  Positive for influenza.  CT head obtained due to agitation and confusion, CT is unremarkable.  Likely experiencing a degree of delirium related to the flu.  Will admit for further management.   FINAL CLINICAL IMPRESSION(S) / ED DIAGNOSES   Final diagnoses:  Acute respiratory failure with hypoxia and hypercapnia (HCC)  COPD exacerbation ([place])  Influenza A     Rx / DC Orders   ED Discharge Orders     None        Note:  This document was prepared using Dragon voice recognition software and may include unintentional dictation errors.  Carrie Mew, MD 05/14/22 [phone]

## 2022-05-14 NOTE — Sepsis Progress Note (Signed)
Elink monitoring for the code sepsis protocol.  

## 2022-05-14 NOTE — Assessment & Plan Note (Addendum)
Initially quite lethargic.  At baseline, awake alert and oriented.  Initially somewhat obtunded.  Following BiPAP, much more awake, however by early afternoon, agitated requiring Precedex.  MRI of head unremarkable

## 2022-05-14 NOTE — Assessment & Plan Note (Signed)
Hold oral antihypertensive agents Place patient on as needed hydralazine for systolic blood pressure greater than 160

## 2022-05-14 NOTE — ED Notes (Signed)
Pt purewick in place, bed dry at this time.

## 2022-05-14 NOTE — Assessment & Plan Note (Addendum)
Check blood sugars every 4 hours while patient is n.p.o. CBGs stable, even despite steroids

## 2022-05-15 DIAGNOSIS — E663 Overweight: Secondary | ICD-10-CM | POA: Diagnosis not present

## 2022-05-15 DIAGNOSIS — J9601 Acute respiratory failure with hypoxia: Secondary | ICD-10-CM | POA: Diagnosis not present

## 2022-05-15 DIAGNOSIS — G9341 Metabolic encephalopathy: Secondary | ICD-10-CM | POA: Diagnosis not present

## 2022-05-15 DIAGNOSIS — J9602 Acute respiratory failure with hypercapnia: Secondary | ICD-10-CM

## 2022-05-15 DIAGNOSIS — J441 Chronic obstructive pulmonary disease with (acute) exacerbation: Secondary | ICD-10-CM | POA: Diagnosis not present

## 2022-05-15 DIAGNOSIS — Z720 Tobacco use: Secondary | ICD-10-CM | POA: Diagnosis present

## 2022-05-15 DIAGNOSIS — J09X1 Influenza due to identified novel influenza A virus with pneumonia: Secondary | ICD-10-CM | POA: Diagnosis not present

## 2022-05-15 LAB — CBC
HCT: 46 % (ref 39.0–52.0)
Hemoglobin: 14.5 g/dL (ref 13.0–17.0)
MCH: 30.1 pg (ref 26.0–34.0)
MCHC: 31.5 g/dL (ref 30.0–36.0)
MCV: 95.6 fL (ref 80.0–100.0)
Platelets: 159 10*3/uL (ref 150–400)
RBC: 4.81 MIL/uL (ref 4.22–5.81)
RDW: 16.5 % — ABNORMAL HIGH (ref 11.5–15.5)
WBC: 7.9 10*3/uL (ref 4.0–10.5)
nRBC: 0 % (ref 0.0–0.2)

## 2022-05-15 LAB — BLOOD GAS, ARTERIAL
Acid-Base Excess: 5.3 mmol/L — ABNORMAL HIGH (ref 0.0–2.0)
Bicarbonate: 31.9 mmol/L — ABNORMAL HIGH (ref 20.0–28.0)
Delivery systems: POSITIVE
Expiratory PAP: 6 cmH2O
FIO2: 40 %
Inspiratory PAP: 16 cmH2O
Mechanical Rate: 20
O2 Saturation: 95.2 %
Patient temperature: 37
pCO2 arterial: 54 mmHg — ABNORMAL HIGH (ref 32–48)
pH, Arterial: 7.38 (ref 7.35–7.45)
pO2, Arterial: 78 mmHg — ABNORMAL LOW (ref 83–108)

## 2022-05-15 LAB — BLOOD GAS, VENOUS
Acid-Base Excess: 2 mmol/L (ref 0.0–2.0)
Acid-Base Excess: 5 mmol/L — ABNORMAL HIGH (ref 0.0–2.0)
Bicarbonate: 29.4 mmol/L — ABNORMAL HIGH (ref 20.0–28.0)
Bicarbonate: 31.8 mmol/L — ABNORMAL HIGH (ref 20.0–28.0)
FIO2: 35 %
O2 Content: 45 L/min
O2 Saturation: 86.2 %
O2 Saturation: 93.9 %
Patient temperature: 37
Patient temperature: 37
pCO2, Ven: 57 mmHg (ref 44–60)
pCO2, Ven: 55 mmHg (ref 44–60)
pH, Ven: 7.32 (ref 7.25–7.43)
pH, Ven: 7.37 (ref 7.25–7.43)
pO2, Ven: 55 mmHg — ABNORMAL HIGH (ref 32–45)
pO2, Ven: 70 mmHg — ABNORMAL HIGH (ref 32–45)

## 2022-05-15 LAB — URINE DRUG SCREEN, QUALITATIVE (ARMC ONLY)
Amphetamines, Ur Screen: NOT DETECTED
Barbiturates, Ur Screen: NOT DETECTED
Benzodiazepine, Ur Scrn: NOT DETECTED
Cannabinoid 50 Ng, Ur ~~LOC~~: NOT DETECTED
Cocaine Metabolite,Ur ~~LOC~~: NOT DETECTED
MDMA (Ecstasy)Ur Screen: NOT DETECTED
Methadone Scn, Ur: NOT DETECTED
Opiate, Ur Screen: NOT DETECTED
Phencyclidine (PCP) Ur S: NOT DETECTED
Tricyclic, Ur Screen: NOT DETECTED

## 2022-05-15 LAB — GLUCOSE, CAPILLARY
Glucose-Capillary: 108 mg/dL — ABNORMAL HIGH (ref 70–99)
Glucose-Capillary: 121 mg/dL — ABNORMAL HIGH (ref 70–99)
Glucose-Capillary: 102 mg/dL — ABNORMAL HIGH (ref 70–99)
Glucose-Capillary: 77 mg/dL (ref 70–99)
Glucose-Capillary: 102 mg/dL — ABNORMAL HIGH (ref 70–99)
Glucose-Capillary: 125 mg/dL — ABNORMAL HIGH (ref 70–99)

## 2022-05-15 LAB — URINE CULTURE

## 2022-05-15 LAB — BASIC METABOLIC PANEL
Anion gap: 8 (ref 5–15)
BUN: 39 mg/dL — ABNORMAL HIGH (ref 8–23)
CO2: 27 mmol/L (ref 22–32)
Calcium: 8.8 mg/dL — ABNORMAL LOW (ref 8.9–10.3)
Chloride: 107 mmol/L (ref 98–111)
Creatinine, Ser: 1.04 mg/dL (ref 0.61–1.24)
GFR, Estimated: >60 mL/min (ref >60)
Glucose, Bld: 117 mg/dL — ABNORMAL HIGH (ref 70–99)
Potassium: 4.5 mmol/L (ref 3.5–5.1)
Sodium: 142 mmol/L (ref 135–145)

## 2022-05-15 LAB — BRAIN NATRIURETIC PEPTIDE: B Natriuretic Peptide: 225.6 pg/mL — ABNORMAL HIGH (ref 0.0–100.0)

## 2022-05-15 MED ORDER — LORAZEPAM 2 MG/ML IJ SOLN
1.0000 mg | Freq: Once | INTRAMUSCULAR | Status: AC
  Administered 2022-05-15: 1 mg via INTRAVENOUS
  Filled 2022-05-15: qty 1

## 2022-05-15 MED ORDER — DEXMEDETOMIDINE HCL IN NACL 400 MCG/100ML IV SOLN
0.2000 ug/kg/h | INTRAVENOUS | Status: DC
  Administered 2022-05-15: 0.2 ug/kg/h via INTRAVENOUS
  Administered 2022-05-15: 0.3 ug/kg/h via INTRAVENOUS
  Filled 2022-05-15 (×3): qty 100

## 2022-05-15 MED ORDER — DEXTROSE 5 % IV SOLN: INTRAVENOUS | Status: DC

## 2022-05-15 MED ORDER — METHYLPREDNISOLONE SODIUM SUCC 40 MG IJ SOLR
40.0000 mg | Freq: Every day | INTRAMUSCULAR | Status: DC
  Administered 2022-05-16: 40 mg via INTRAVENOUS
  Filled 2022-05-15: qty 1

## 2022-05-15 NOTE — Hospital Course (Addendum)
71 year old male with past medical history of ongoing tobacco abuse, COPD and hypertension brought to the emergency room on the early morning hours of 12/21 for cough, fever and progressively worsening shortness of breath times several days.  The emergency room, patient noted to be significantly hypoxic and febrile and positive for influenza A.  Patient also noted to be confused and ABG noted hypercapnia.  Patient admitted to the hospitalist service and critical care consulted.  Patient placed on BiPAP and overnight, mentation and hypercapnia improved.  However, by afternoon of 12/22, patient more agitated and combative and started on Precedex.  BiPAP placed to prevent hypercapnia.

## 2022-05-15 NOTE — Progress Notes (Signed)
Pt changed to [place] due to agitation on BiPAP

## 2022-05-15 NOTE — Progress Notes (Deleted)
BRIEF PCCM NOTE   Pt is awake and alert, weaned off BiPAP to 4L Nasal cannula.  Hemodynamically stable, no vasopressors.  Follow up VBG this morning much improved, hypercapnia resolved.   Would recommend continued treatment for COPD with nebulizers, a burst of steroids and antibiotics. Given he was hypercapnic recommend nocturnal BiPAP moving forward and he should be discharged on nocturnal BIPAP.   PCCM will sign off at this time, please re-consult should any critical care needs arise or if we can be of further assistance.   Darel Hong, AGACNP-BC [facility] Prefer epic messenger for cross cover needs If after hours, please call E-link

## 2022-05-15 NOTE — Assessment & Plan Note (Signed)
-  Nicotine patch 

## 2022-05-15 NOTE — Progress Notes (Signed)
[facility] Progress Note  Patient: Christopher Mendez    CBS:[id number]  DOA: 05/13/2022    Date of Service: the patient was seen and examined on 05/15/2022  Brief hospital course: 70 year old male with past medical history of ongoing tobacco abuse, COPD and hypertension brought to the emergency room on the early morning hours of 12/21 for cough, fever and progressively worsening shortness of breath times several days.  The emergency room, patient noted to be significantly hypoxic and febrile and positive for influenza A.  Patient also noted to be confused and ABG noted hypercapnia.  Patient admitted to the hospitalist service and critical care consulted.  Patient placed on BiPAP and overnight, mentation and hypercapnia improved.  However, by afternoon of 12/22, patient more agitated and combative and started on Precedex.  BiPAP placed to prevent hypercapnia.   Assessment and Plan: * Influenza A with pneumonia Patient presents for evaluation of altered mental status and weakness and his influenza A PCR is positive.  Procalcitonin essentially normal.  Will discontinue antibiotics.  CT scan of chest has atelectasis versus infiltrate  Acute respiratory failure with hypoxia and hypercapnia (HCC) Improved on BiPAP and able to be weaned off, but as following day has progressed, sedation may lead to recurrence of hypercapnia and hypoxia so placed back on BiPAP.  Critical care following.    Acute metabolic encephalopathy Initially quite lethargic.  At baseline, awake alert and oriented.  Initially somewhat obtunded.  Following BiPAP, much more awake, however by early afternoon, agitated requiring Precedex.  MRI of head unremarkable  COPD exacerbation (HCC) Acute exacerbation secondary to influenza A infection Continue bronchodilator therapy and inhaled steroids  HTN (hypertension) Hold oral antihypertensive agents Place patient on as needed hydralazine for systolic blood pressure greater than  160  Controlled type 2 diabetes mellitus without complication, without long-term current use of insulin (HCC) Check blood sugars every 4 hours while patient is n.p.o. CBGs stable, even despite steroids  Tobacco abuse Nicotine patch  Overweight (BMI 25.0-29.9) Meets criteria with BMI greater than 25   Pressure ulcer: Stage II on sacrum, present on admission     Body mass index is 25.92 kg/m.    Pressure Injury 05/14/22 Sacrum Mid Deep Tissue Pressure Injury - Purple or maroon localized area of discolored intact skin or blood-filled blister due to damage of underlying soft tissue from pressure and/or shear. (Active)  05/14/22 2030  Location: Sacrum  Location Orientation: Mid  Staging: Deep Tissue Pressure Injury - Purple or maroon localized area of discolored intact skin or blood-filled blister due to damage of underlying soft tissue from pressure and/or shear.  Wound Description (Comments):   Present on Admission: Yes  Dressing Type Foam - Lift dressing to assess site every shift 05/15/22 1300     Consultants: Critical care  Procedures: None  Antimicrobials: IV Rocephin and Zithromax 12/21 - 12/22  Code Status: Full code   Subjective: Currently somnolent on Precedex drip  Objective: Mildly tachypneic Vitals:   05/15/22 1716 05/15/22 1800  BP: 96/71 (!) 131/56  Pulse: (!) 28 (!) 36  Resp: 19 (!) 32  Temp:    SpO2: 94% 93%    Intake/Output Summary (Last 24 hours) at 05/15/2022 1839 Last data filed at 05/15/2022 1831 Gross per 24 hour  Intake 960.62 ml  Output 1250 ml  Net -289.38 ml   Filed Weights   05/14/22 0012 05/14/22 2037 05/14/22 2100  Weight: 92.2 kg 86.7 kg 86.7 kg   Body mass index is 25.92 kg/m.  Exam:  General: Somnolent from medication HEENT: Normocephalic, atraumatic, mucous membranes slightly dry Cardiovascular: Regular rate and rhythm, S1-S2 Respiratory: Decreased breath sounds throughout Abdomen: Soft, nontender, nondistended,  hypoactive bowel sounds Musculoskeletal: No clubbing or cyanosis, trace pitting edema Skin: No skin breaks, tears or lesions Psychiatry: Currently sedated Neurology: Exam limited due to sedation  Data Reviewed: Follow-up ABG noting pCO2 54 on BiPAP.  BNP of 226  Disposition:  Status is: Inpatient Remains inpatient appropriate because:  -Improvement in oxygen -Improvement in mentation    Anticipated discharge date: 12/26 Family Communication: Will call family DVT Prophylaxis: enoxaparin (LOVENOX) injection 40 mg Start: 05/14/22 2200    Author: Annita Brod ,MD 05/15/2022 6:39 PM  To reach On-call, see care teams to locate the attending and reach out via [email or web address]. Between 7PM-7AM, please contact night-coverage If you still have difficulty reaching the attending provider, please page the [facility] (Director on Call) for [facility] on amion for assistance.

## 2022-05-15 NOTE — Assessment & Plan Note (Signed)
Meets criteria with BMI greater than 25 

## 2022-05-15 NOTE — Progress Notes (Signed)
NAME:  Christopher Mendez, MRN:  [id number], DOB:  1950-06-01, LOS: 1 ADMISSION DATE:  05/13/2022, CONSULTATION DATE: 05/14/2022 REFERRING MD: Dr. Francine Graven, CHIEF COMPLAINT: Acute respiratory distress  Brief Pt Description / Synopsis:  71 year old male admitted with acute hypoxic and hypercapnic respiratory failure in the setting of acute COPD exacerbation due to Influenza A infection requiring BiPAP.  History of Present Illness:  Christopher Mendez is a 71 year old male with a past medical history significant for COPD and hypertension who presented to [facility] ED on 05/14/2022 due to acute respiratory distress, weakness, and fall.  Patient is currently lethargic and unable to contribute to history and no family is currently available, therefore history is obtained from ED and nursing notes.  Per chart review, the patient and his wife had been sick for a few days with upper respiratory symptoms including cough, fever, chills.  1 day prior to presentation the husband became very weak and extremely confused, and subsequently fell later that night, prompting her to activate EMS.  Upon EMS arrival he was noted to be in respiratory distress and hypoxic with O2 sats in the 60s.  He was placed on nonrebreather mask with improvement and O2 sats to 88%.  He was also noted to be febrile with temperature of 101.4.  ED Course: Initial Vital Signs: Temperature 98.8 F, pulse 86, respiratory rate 22, blood pressure 113/53, SpO2 93% Significant Labs: Glucose 125, lactic acid 1.9, procalcitonin 0.13, WBC 9.3 Positive for influenza A VBG on BiPAP: pH 7.22/pCO2 74/pO2 38/bicarb 30.3 Imaging Chest X-ray>>FINDINGS: There is central pulmonary vascular congestion. There is no lung consolidation, pleural effusion or pneumothorax. There is minimal atelectasis in the left lung base. Cardiomediastinal silhouette is within normal limits. No acute fractures are seen. CT Chest w/o Contrast>>IMPRESSION: Mild right posterior  basilar subsegmental atelectasis or infiltrate is noted. Extensive coronary artery calcifications are noted suggesting coronary disease. Aortic Atherosclerosis CT Head w/o contrast>>IMPRESSION: No acute intracranial process. Medications Administered: EMS gave 2 g mag sulfate, 125 Solu-Medrol, 1 DuoNeb, 1.2 L LR bolus  In ED: 1 mg of Ativan, bronchodilators, IV ceftriaxone and Zithromax  Given his work of breathing he was placed on BiPAP.  Hospitalist were asked to admit the patient for further workup and treatment.  PCCM was asked to evaluate the patient given high risk for intubation.  Please see "significant hospital events" section below for full detailed hospital course.  Pertinent  Medical History   Past Medical History:  Diagnosis Date   Cardiac enlargement    COPD (chronic obstructive pulmonary disease) ([place])    HTN (hypertension)     Micro Data:  12/21: SARS-CoV-2 PCR>> negative 12/21: + Influenza A 12/21: Blood culture x 2>>NGTD 12/21: Urine>> multiple species, suggest recollection 12/21: MRSA PCR>>negative  Antimicrobials:  Azithromycin 12/21>> Ceftriaxone 12/21>> Oseltamivir 12/21>>  Significant Hospital Events: Including procedures, antibiotic start and stop dates in addition to other pertinent events   12/21: Admitted by hospitalist.  Requiring BiPAP.  ABG with hypercapnia, high risk for intubation.  PCCM consulted. 12/22: Off of BiPAP, VBG improved, hypercapnia resolved. PCCM initially signing off, however pt became agitated/removing supplemental O2 with desaturations, somnolent after Ativan, placed back on BiPAP.  PCCM will continue to follow.  Interim History / Subjective:  -Pt was weaned off BiPAP earlier this morning, awake and alert (somewhat confused) ~ ABG much improved ~ PCCM initially was signing off -However, later in the morning, pt agitated continuously pulling off O2 with desaturations -Pt was given Ativan by, pt is now  calmer and compliant,  able to arouse and follow commands ~ AVOID ANY FURTHER ATIVAN ~ will utilize Precedex if further sedation is needed ~ PCCM will continue to follow for now -Hemodynamically stable, no pressors, did have low grade fever overnight of 100.5 F   Objective   Blood pressure (!) 149/75, pulse 79, temperature 99.2 F (37.3 C), resp. rate (!) 21, height 6' (1.829 m), weight 86.7 kg, SpO2 96 %.    FiO2 (%):  [33 %-40 %] 33 %   Intake/Output Summary (Last 24 hours) at 05/15/2022 1154 Last data filed at 05/15/2022 0620 Gross per 24 hour  Intake 701.28 ml  Output 650 ml  Net 51.28 ml    Filed Weights   05/14/22 0012 05/14/22 2037 05/14/22 2100  Weight: 92.2 kg 86.7 kg 86.7 kg    Examination: General: Acute on chronically ill-appearing male, laying in bed, on HHFNC 40% FiO2, no acute distress HENT: Atraumatic, normocephalic, neck supple, no JVD Lungs: Clear diminished breath sounds bilaterally, no wheezing noted, even, non-labored Cardiovascular: Regular rate and rhythm, S1-S2, no murmurs, rubs, gallops Abdomen: Soft, nontender, nondistended, no guarding or rebound tenderness, bowel sounds positive x 4 Extremities: Normal bulk and tone, no deformities, no edema Neuro: Lethargic s/p Ativan, arouses to voice, oriented only to self, will follow commands, moves all extremities to commands, no focal deficits, pupils PERRL GU: External male catheter in place   Resolved Hospital Problem list     Assessment & Plan:   Acute Hypoxic & Hypercapnic Respiratory Failure in setting of AECOPD due to Influenza Pneumonia -Supplemental O2 as needed to maintain O2 sats 88 to 92% -BiPAP, wean as tolerated -High risk for intubation -Follow intermittent Chest X-ray & ABG as needed -Bronchodilators & Pulmicort nebs -IV Steroids (decreased to Solumedrol 40 mg daily 12/22) -ABX as above -Diuresis as BP and renal function permits -Pulmonary toilet as able  Influenza A pneumonia ? Superimposed CAP -Monitor  fever curve -Trend WBC's & Procalcitonin -Follow cultures as above -Continue empiric azithromycin, ceftriaxone, and Tamiflu pending cultures & sensitivities  Acute metabolic encephalopathy, ? ICU delirium vs ? R/t steroids CO2 Narcosis ~ RESOLVED -Treatment of hypercapnia and metabolic derangements as outlined above -Provide supportive care -Promote normal sleep/wake cycle and family presence -Avoid sedating meds as able (NO MORE ATIVAN) ~ will utilize Precedex if needed -CT head negative for any acute findings -Will decrease steroids -Check UDS -Pt's daughter at bedside reports pt does not abuse ETOH    Best Practice (right click and "Reselect all SmartList Selections" daily)   Diet/type: NPO for now until mental status improved DVT prophylaxis: LMWH GI prophylaxis: N/A Lines: N/A Foley:  N/A Code Status:  full code Last date of multidisciplinary goals of care discussion [12/22]  12/22: Updated pt's daughter Ivin Booty at bedside.  Labs   CBC: Recent Labs  Lab 05/14/22 0002 05/15/22 0320  WBC 9.3 7.9  NEUTROABS 7.7  --   HGB 15.0 14.5  HCT 47.2 46.0  MCV 94.4 95.6  PLT 186 159     Basic Metabolic Panel: Recent Labs  Lab 05/14/22 0002 05/15/22 0320  NA 139 142  K 4.3 4.5  CL 105 107  CO2 27 27  GLUCOSE 125* 117*  BUN 27* 39*  CREATININE 1.05 1.04  CALCIUM 8.9 8.8*    GFR: Estimated Creatinine Clearance: 69.4 mL/min (by C-G formula based on SCr of 1.04 mg/dL). Recent Labs  Lab 05/14/22 0002 05/14/22 0935 05/15/22 0320  PROCALCITON  --  0.13  --  WBC 9.3  --  7.9  LATICACIDVEN 1.9  --   --      Liver Function Tests: Recent Labs  Lab 05/14/22 0002  AST 22  ALT 9  ALKPHOS 45  BILITOT 1.1  PROT 7.0  ALBUMIN 3.9    No results for input(s): "LIPASE", "AMYLASE" in the last 168 hours. No results for input(s): "AMMONIA" in the last 168 hours.  ABG    Component Value Date/Time   PHART 7.3 (L) 05/14/2022 1556   PCO2ART 59 (H) 05/14/2022  1556   PO2ART 67 (L) 05/14/2022 1556   HCO3 29.4 (H) 05/15/2022 0833   O2SAT 86.2 05/15/2022 0833     Coagulation Profile: No results for input(s): "INR", "PROTIME" in the last 168 hours.  Cardiac Enzymes: No results for input(s): "CKTOTAL", "CKMB", "CKMBINDEX", "TROPONINI" in the last 168 hours.  HbA1C: Hgb A1c MFr Bld  Date/Time Value Ref Range Status  05/14/2022 09:35 AM 6.0 (H) 4.8 - 5.6 % Final    Comment:    (NOTE)         Prediabetes: 5.7 - 6.4         Diabetes: >6.4         Glycemic control for adults with diabetes: <7.0     CBG: Recent Labs  Lab 05/14/22 2028 05/14/22 2320 05/15/22 0310 05/15/22 0723 05/15/22 1109  GLUCAP 108* 111* 121* 102* 77    Review of Systems:   Unable to assess due to AMS   Past Medical History:  He,  has a past medical history of Cardiac enlargement, COPD (chronic obstructive pulmonary disease) ([place]), and HTN (hypertension).   Surgical History:   Past Surgical History:  Procedure Laterality Date   AMPUTATION TOE Right 07/14/2019   Procedure: AMPUTATION TOE RAY RIGHT 5TH PARTIAL;  Surgeon: Caroline More, DPM;  Location: [facility] ORS;  Service: Podiatry;  Laterality: Right;   EYE SURGERY       Social History:   reports that he has been smoking cigarettes. He has a 56.00 pack-year smoking history. He has never used smokeless tobacco. He reports current alcohol use of about 42.0 standard drinks of alcohol per week. He reports that he does not use drugs.   Family History:  His family history includes Diabetes in his father; Hypertension in his mother.   Allergies Allergies  Allergen Reactions   Flomax [Tamsulosin Hcl] Rash   Myrbetriq [Mirabegron] Rash     Home Medications  Prior to Admission medications   Medication Sig Start Date End Date Taking? Authorizing Provider  albuterol (VENTOLIN HFA) 108 (90 Base) MCG/ACT inhaler Inhale 2 puffs into the lungs every 6 (six) hours as needed for wheezing or shortness of breath.   Yes  [provider]  amLODipine (NORVASC) 10 MG tablet Take 10 mg by mouth daily. 12/12/20  Yes [provider]  Ascorbic Acid (VITAMIN C) 1000 MG tablet Take 1,000 mg by mouth daily.   Yes [provider]  aspirin-acetaminophen-caffeine (EXCEDRIN MIGRAINE) 2403370444 MG tablet Take 3 tablets by mouth daily as needed (Body aches).   Yes [provider]  cholecalciferol (VITAMIN D3) 25 MCG (1000 UNIT) tablet Take 1,000 Units by mouth daily.   Yes [provider]  Fluticasone-Salmeterol (ADVAIR) 500-50 MCG/DOSE AEPB Inhale 1 puff into the lungs 2 (two) times daily.   Yes [provider]  hydrochlorothiazide (HYDRODIURIL) 25 MG tablet Take 25 mg by mouth daily.   Yes [provider]  ipratropium-albuterol (DUONEB) 0.5-2.5 (3) MG/3ML SOLN Take 3  mLs by nebulization every 6 (six) hours as needed. 03/17/22  Yes [provider]  lisinopril (PRINIVIL,ZESTRIL) 40 MG tablet Take 40 mg by mouth daily.   Yes [provider]  Melatonin 10 MG CAPS Take 10 mg by mouth at bedtime as needed.   Yes [provider]  metFORMIN (GLUCOPHAGE) 1000 MG tablet Take 1,000 mg by mouth 2 (two) times daily. 01/28/21  Yes [provider]  omeprazole (PRILOSEC) 20 MG capsule Take 20 mg by mouth daily.   Yes [provider]  tiotropium (SPIRIVA) 18 MCG inhalation capsule Place 18 mcg into inhaler and inhale daily.   Yes [provider]  gabapentin (NEURONTIN) 100 MG capsule 100 mg in the morning, at noon, and at bedtime. Patient not taking: Reported on 05/14/2022 10/31/19   [provider]  rosuvastatin (CRESTOR) 20 MG tablet Take 20 mg by mouth daily. Patient not taking: Reported on 05/14/2022 10/28/20   [provider]  triamcinolone cream (KENALOG) 0.1 % Apply 1 application topically 2 (two) times daily as needed (dry skin).  Patient not taking: Reported on 05/14/2022    [provider]      Critical care time: 40 minutes     Darel Hong, AGACNP-BC [facility] epic messenger for cross cover needs If after hours, please call E-link

## 2022-05-16 ENCOUNTER — Inpatient Hospital Stay: Payer: Medicare Other

## 2022-05-16 DIAGNOSIS — I4891 Unspecified atrial fibrillation: Secondary | ICD-10-CM | POA: Diagnosis not present

## 2022-05-16 DIAGNOSIS — J9601 Acute respiratory failure with hypoxia: Secondary | ICD-10-CM | POA: Diagnosis not present

## 2022-05-16 DIAGNOSIS — J441 Chronic obstructive pulmonary disease with (acute) exacerbation: Secondary | ICD-10-CM | POA: Diagnosis not present

## 2022-05-16 DIAGNOSIS — J09X1 Influenza due to identified novel influenza A virus with pneumonia: Secondary | ICD-10-CM | POA: Diagnosis not present

## 2022-05-16 DIAGNOSIS — G9341 Metabolic encephalopathy: Secondary | ICD-10-CM | POA: Diagnosis not present

## 2022-05-16 LAB — RENAL FUNCTION PANEL
Albumin: 3.2 g/dL — ABNORMAL LOW (ref 3.5–5.0)
Anion gap: 5 (ref 5–15)
BUN: 34 mg/dL — ABNORMAL HIGH (ref 8–23)
CO2: 29 mmol/L (ref 22–32)
Calcium: 8.9 mg/dL (ref 8.9–10.3)
Chloride: 108 mmol/L (ref 98–111)
Creatinine, Ser: 0.79 mg/dL (ref 0.61–1.24)
GFR, Estimated: >60 mL/min (ref >60)
Glucose, Bld: 111 mg/dL — ABNORMAL HIGH (ref 70–99)
Phosphorus: 2.7 mg/dL (ref 2.5–4.6)
Potassium: 4.8 mmol/L (ref 3.5–5.1)
Sodium: 142 mmol/L (ref 135–145)

## 2022-05-16 LAB — BLOOD GAS, VENOUS
Acid-Base Excess: 14.5 mmol/L — ABNORMAL HIGH (ref 0.0–2.0)
Bicarbonate: 43 mmol/L — ABNORMAL HIGH (ref 20.0–28.0)
O2 Saturation: 77.3 %
Patient temperature: 37
pCO2, Ven: 71 mmHg (ref 44–60)
pH, Ven: 7.39 (ref 7.25–7.43)
pO2, Ven: 53 mmHg — ABNORMAL HIGH (ref 32–45)

## 2022-05-16 LAB — GLUCOSE, CAPILLARY
Glucose-Capillary: 106 mg/dL — ABNORMAL HIGH (ref 70–99)
Glucose-Capillary: 103 mg/dL — ABNORMAL HIGH (ref 70–99)
Glucose-Capillary: 103 mg/dL — ABNORMAL HIGH (ref 70–99)
Glucose-Capillary: 121 mg/dL — ABNORMAL HIGH (ref 70–99)
Glucose-Capillary: 108 mg/dL — ABNORMAL HIGH (ref 70–99)
Glucose-Capillary: 110 mg/dL — ABNORMAL HIGH (ref 70–99)

## 2022-05-16 LAB — CBC
HCT: 46 % (ref 39.0–52.0)
Hemoglobin: 14.2 g/dL (ref 13.0–17.0)
MCH: 29.4 pg (ref 26.0–34.0)
MCHC: 30.9 g/dL (ref 30.0–36.0)
MCV: 95.2 fL (ref 80.0–100.0)
Platelets: 144 10*3/uL — ABNORMAL LOW (ref 150–400)
RBC: 4.83 MIL/uL (ref 4.22–5.81)
RDW: 16.1 % — ABNORMAL HIGH (ref 11.5–15.5)
WBC: 5.1 10*3/uL (ref 4.0–10.5)
nRBC: 0 % (ref 0.0–0.2)

## 2022-05-16 LAB — PROCALCITONIN: Procalcitonin: <0.1 ng/mL

## 2022-05-16 LAB — MAGNESIUM: Magnesium: 2.4 mg/dL (ref 1.7–2.4)

## 2022-05-16 MED ORDER — PREDNISONE 10 MG PO TABS
40.0000 mg | ORAL_TABLET | Freq: Every day | ORAL | Status: AC
  Administered 2022-05-17 – 2022-05-19 (×3): 40 mg via ORAL
  Filled 2022-05-16 (×3): qty 4

## 2022-05-16 MED ORDER — METOPROLOL TARTRATE 25 MG PO TABS
25.0000 mg | ORAL_TABLET | Freq: Two times a day (BID) | ORAL | Status: DC
  Administered 2022-05-16: 25 mg via ORAL
  Filled 2022-05-16: qty 1

## 2022-05-16 MED ORDER — AMIODARONE LOAD VIA INFUSION
150.0000 mg | Freq: Once | INTRAVENOUS | Status: DC
  Filled 2022-05-16: qty 83.34

## 2022-05-16 MED ORDER — METOPROLOL TARTRATE 25 MG PO TABS
25.0000 mg | ORAL_TABLET | Freq: Two times a day (BID) | ORAL | Status: DC
  Administered 2022-05-16 – 2022-05-17 (×3): 25 mg via ORAL
  Filled 2022-05-16 (×4): qty 1

## 2022-05-16 MED ORDER — METOPROLOL TARTRATE 25 MG PO TABS: 37.5000 mg | ORAL_TABLET | Freq: Two times a day (BID) | ORAL | Status: DC

## 2022-05-16 MED ORDER — AMIODARONE HCL IN DEXTROSE 360-4.14 MG/200ML-% IV SOLN: 30.0000 mg/h | INTRAVENOUS | Status: DC

## 2022-05-16 MED ORDER — METOPROLOL TARTRATE 25 MG PO TABS
12.5000 mg | ORAL_TABLET | Freq: Two times a day (BID) | ORAL | Status: DC
  Administered 2022-05-16: 12.5 mg via ORAL
  Filled 2022-05-16: qty 1

## 2022-05-16 MED ORDER — METOPROLOL TARTRATE 5 MG/5ML IV SOLN
5.0000 mg | Freq: Once | INTRAVENOUS | Status: AC
  Administered 2022-05-16: 5 mg via INTRAVENOUS
  Filled 2022-05-16: qty 5

## 2022-05-16 MED ORDER — AMIODARONE HCL 200 MG PO TABS: 400.0000 mg | ORAL_TABLET | Freq: Every day | ORAL | Status: DC

## 2022-05-16 MED ORDER — AMIODARONE HCL IN DEXTROSE 360-4.14 MG/200ML-% IV SOLN: 60.0000 mg/h | INTRAVENOUS | Status: DC

## 2022-05-16 MED ORDER — NICOTINE 21 MG/24HR TD PT24
21.0000 mg | MEDICATED_PATCH | Freq: Every day | TRANSDERMAL | Status: DC
  Administered 2022-05-16 – 2022-05-24 (×9): 21 mg via TRANSDERMAL
  Filled 2022-05-16 (×10): qty 1

## 2022-05-16 MED ORDER — INSULIN ASPART 100 UNIT/ML IJ SOLN
0.0000 [IU] | Freq: Three times a day (TID) | INTRAMUSCULAR | Status: DC
  Administered 2022-05-17 – 2022-05-23 (×10): 2 [IU] via SUBCUTANEOUS
  Filled 2022-05-16 (×10): qty 1

## 2022-05-16 NOTE — Assessment & Plan Note (Addendum)
New onset.  Cardiology consulted.  Echocardiogram notes bilateral severe atrial dilatation.  Adjusted metoprolol for mild bradycardia and this has since improved.  Started on anticoagulation

## 2022-05-16 NOTE — Progress Notes (Signed)
Having multiple PVC's/bigeminy, Notified Sharion Settler NP, no new orders, Echo has already been ordered. Will continue to monitor

## 2022-05-16 NOTE — Progress Notes (Signed)
[facility] Progress Note  Patient: Christopher Mendez    EAV:[id number]  DOA: 05/13/2022    Date of Service: the patient was seen and examined on 05/16/2022  Brief hospital course: 71 year old male with past medical history of ongoing tobacco abuse, COPD and hypertension brought to the emergency room on the early morning hours of 12/21 for cough, fever and progressively worsening shortness of breath times several days.  The emergency room, patient noted to be significantly hypoxic and febrile and positive for influenza A.  Patient also noted to be confused and ABG noted hypercapnia.  Patient admitted to the hospitalist service and critical care consulted.  Patient placed on BiPAP and overnight, mentation and hypercapnia improved.  However, by afternoon of 12/22, patient more agitated and combative and started on Precedex.  BiPAP placed to prevent hypercapnia.  By 12/24, patient's mentation and breathing improved and able to be taken off of BiPAP on high flow oxygen.  Patient also went into new onset rapid atrial fibrillation and cardiology consulted.   Assessment and Plan: * Influenza A with pneumonia Patient presents for evaluation of altered mental status and weakness and his influenza A PCR is positive.  Procalcitonin essentially normal.  Will discontinue antibiotics.  CT scan of chest has atelectasis versus infiltrate  Acute respiratory failure with hypoxia and hypercapnia (HCC) Improved on BiPAP and currently on high flow oxygen.  Recheck ABG in the morning.  Appreciate cardiology help.  Atrial fibrillation with RVR ([place]) New onset.  Cardiology consulted.  Echocardiogram pending.  Titrating up metoprolol for heart rate control.  Started on anticoagulation.  Acute metabolic encephalopathy Initially quite lethargic.  At baseline, awake alert and oriented.  Initially somewhat obtunded.  Following BiPAP, much more awake, however by early afternoon, agitated requiring Precedex.  MRI of  head unremarkable  COPD exacerbation (HCC) Acute exacerbation secondary to influenza A infection Continue bronchodilator therapy and inhaled steroids  HTN (hypertension) Hold oral antihypertensive agents Place patient on as needed hydralazine for systolic blood pressure greater than 160  Controlled type 2 diabetes mellitus without complication, without long-term current use of insulin (HCC) Check blood sugars every 4 hours while patient is n.p.o. CBGs stable, even despite steroids  Tobacco abuse Nicotine patch  Overweight (BMI 25.0-29.9) Meets criteria with BMI greater than 25   Pressure ulcer: Stage II on sacrum, present on admission     Body mass index is 25.92 kg/m.    Pressure Injury 05/14/22 Sacrum Mid Deep Tissue Pressure Injury - Purple or maroon localized area of discolored intact skin or blood-filled blister due to damage of underlying soft tissue from pressure and/or shear. (Active)  05/14/22 2030  Location: Sacrum  Location Orientation: Mid  Staging: Deep Tissue Pressure Injury - Purple or maroon localized area of discolored intact skin or blood-filled blister due to damage of underlying soft tissue from pressure and/or shear.  Wound Description (Comments):   Present on Admission: Yes  Dressing Type Foam - Lift dressing to assess site every shift 05/16/22 2000     Consultants: Critical care Cardiology  Procedures: Echocardiogram pending  Antimicrobials: IV Rocephin and Zithromax 12/21 - 12/22  Code Status: Full code   Subjective: Awake, complains of some mild shortness of breath  Objective: Mildly tachypneic Vitals:   05/16/22 1900 05/16/22 2000  BP: 139/71 (!) 123/50  Pulse: 71 (!) 36  Resp: (!) 23 (!) 32  Temp:  99 F (37.2 C)  SpO2: 92% 95%    Intake/Output Summary (Last 24 hours) at 05/16/2022  2114 Last data filed at 05/16/2022 1700 Gross per 24 hour  Intake 906.61 ml  Output 1750 ml  Net -843.39 ml    Filed Weights   05/14/22  0012 05/14/22 2037 05/14/22 2100  Weight: 92.2 kg 86.7 kg 86.7 kg   Body mass index is 25.92 kg/m.  Exam:  General: Oriented x 2, fatigued HEENT: Normocephalic, atraumatic, mucous membranes slightly dry Cardiovascular: Irregular rhythm, tachycardic earlier Respiratory: Decreased breath sounds throughout Abdomen: Soft, nontender, nondistended, hypoactive bowel sounds Musculoskeletal: No clubbing or cyanosis, trace pitting edema Skin: No skin breaks, tears or lesions Psychiatry: Appropriate, no evidence of psychoses Neurology: No focal deficits  Data Reviewed: ABG today notes pCO2 of 71 and pO2 of 53 with bicarb of 43  Disposition:  Status is: Inpatient Remains inpatient appropriate because:  -Improvement in oxygen -Improvement in mentation    Anticipated discharge date: 12/28 Family Communication: Will call wife DVT Prophylaxis: enoxaparin (LOVENOX) injection 40 mg Start: 05/14/22 2200    Author: Annita Brod ,MD 05/16/2022 9:14 PM  To reach On-call, see care teams to locate the attending and reach out via [email or web address]. Between 7PM-7AM, please contact night-coverage If you still have difficulty reaching the attending provider, please page the [facility] (Director on Call) for [facility] on amion for assistance.

## 2022-05-16 NOTE — Consult Note (Addendum)
  Amiodarone Drug - Drug Interaction Consult Note  Recommendations: Continue telemetry monitoring while patient is taking PRN ondansetron given risk of QT prolongation  Amiodarone is metabolized by the cytochrome P450 system and therefore has the potential to cause many drug interactions. Amiodarone has an average plasma half-life of 50 days (range 20 to 100 days).   There is potential for drug interactions to occur several weeks or months after stopping treatment and the onset of drug interactions may be slow after initiating amiodarone.   '[x]'$  Beta blockers: increased risk of bradycardia, AV block and myocardial depression. Sotalol - avoid concomitant use.  '[x]'$  Drugs that prolong the QT interval:  Torsades de pointes risk may be increased with concurrent use - avoid if possible.  Monitor QTc, also keep magnesium/potassium WNL if concurrent therapy can't be avoided.  Antibiotics: e.g. fluoroquinolones, erythromycin.  Antiarrhythmics: e.g. quinidine, procainamide, disopyramide, sotalol.  Antipsychotics: e.g. phenothiazines, haloperidol.   Lithium, tricyclic antidepressants, and methadone.  Thank You,   Darrick Penna Clinical Pharmacist 05/16/2022 4:00 PM

## 2022-05-16 NOTE — Consult Note (Signed)
Christopher Mendez is a 71 y.o. male  [id number]  Primary Cardiologist: Neoma Laming Reason for Consultation: Atrial fibrillation  HPI: 71 year old white male who presented to the hospital with acute shortness of breath and was found to be in heart hypoxic with CO2 retention and history of COPD.  Patient is having acute influenza infection.  Patient was given Lopressor for A-fib with rapid rate and has dropped his heart rate to controlled ventricular rate.  Patient is still very short of breath.   Review of Systems: No chest pain   Past Medical History:  Diagnosis Date   Cardiac enlargement    COPD (chronic obstructive pulmonary disease) (HCC)    HTN (hypertension)     Medications Prior to Admission  Medication Sig Dispense Refill   albuterol (VENTOLIN HFA) 108 (90 Base) MCG/ACT inhaler Inhale 2 puffs into the lungs every 6 (six) hours as needed for wheezing or shortness of breath.     amLODipine (NORVASC) 10 MG tablet Take 10 mg by mouth daily.     Ascorbic Acid (VITAMIN C) 1000 MG tablet Take 1,000 mg by mouth daily.     aspirin-acetaminophen-caffeine (EXCEDRIN MIGRAINE) 250-250-65 MG tablet Take 3 tablets by mouth daily as needed (Body aches).     cholecalciferol (VITAMIN D3) 25 MCG (1000 UNIT) tablet Take 1,000 Units by mouth daily.     Fluticasone-Salmeterol (ADVAIR) 500-50 MCG/DOSE AEPB Inhale 1 puff into the lungs 2 (two) times daily.     hydrochlorothiazide (HYDRODIURIL) 25 MG tablet Take 25 mg by mouth daily.     ipratropium-albuterol (DUONEB) 0.5-2.5 (3) MG/3ML SOLN Take 3 mLs by nebulization every 6 (six) hours as needed.     lisinopril (PRINIVIL,ZESTRIL) 40 MG tablet Take 40 mg by mouth daily.     Melatonin 10 MG CAPS Take 10 mg by mouth at bedtime as needed.     metFORMIN (GLUCOPHAGE) 1000 MG tablet Take 1,000 mg by mouth 2 (two) times daily.     omeprazole (PRILOSEC) 20 MG capsule Take 20 mg by mouth daily.     tiotropium (SPIRIVA) 18 MCG inhalation capsule Place 18  mcg into inhaler and inhale daily.     gabapentin (NEURONTIN) 100 MG capsule 100 mg in the morning, at noon, and at bedtime. (Patient not taking: Reported on 05/14/2022)     rosuvastatin (CRESTOR) 20 MG tablet Take 20 mg by mouth daily. (Patient not taking: Reported on 05/14/2022)     triamcinolone cream (KENALOG) 0.1 % Apply 1 application topically 2 (two) times daily as needed (dry skin).  (Patient not taking: Reported on 05/14/2022)        amiodarone  150 mg Intravenous Once   budesonide (PULMICORT) nebulizer solution  0.25 mg Nebulization BID   Chlorhexidine Gluconate Cloth  6 each Topical Daily   enoxaparin (LOVENOX) injection  40 mg Subcutaneous Q24H   insulin aspart  0-15 Units Subcutaneous Q4H   ipratropium-albuterol  3 mL Nebulization Q6H   metoprolol tartrate  25 mg Oral BID   nicotine  21 mg Transdermal Daily   oseltamivir  75 mg Oral BID   [START ON 05/17/2022] predniSONE  40 mg Oral Daily    Infusions:  amiodarone Stopped (05/16/22 1324)   Followed by   amiodarone Stopped (05/16/22 1915)   dexmedetomidine (PRECEDEX) IV infusion Stopped (05/16/22 0816)    Allergies  Allergen Reactions   Flomax [Tamsulosin Hcl] Rash   Myrbetriq [Mirabegron] Rash    Social History   Socioeconomic History   Marital  status: Married    Spouse name: Not on file   Number of children: Not on file   Years of education: Not on file   Highest education level: Not on file  Occupational History   Not on file  Tobacco Use   Smoking status: Every Day    Packs/day: 1.00    Years: 56.00    Total pack years: 56.00    Types: Cigarettes   Smokeless tobacco: Never   Tobacco comments:    smoking since he was 10  Vaping Use   Vaping Use: Never used  Substance and Sexual Activity   Alcohol use: Yes    Alcohol/week: 42.0 standard drinks of alcohol    Types: 42 Cans of beer per week    Comment:  stopped drinking 5 weeks ago   Drug use: No   Sexual activity: Not Currently  Other Topics  Concern   Not on file  Social History Narrative   Not on file   Social Determinants of Health   Financial Resource Strain: Not on file  Food Insecurity: Not on file  Transportation Needs: Not on file  Physical Activity: Not on file  Stress: Not on file  Social Connections: Not on file  Intimate Partner Violence: Not on file    Family History  Problem Relation Age of Onset   Hypertension Mother    Diabetes Father     PHYSICAL EXAM: Vitals:   05/16/22 1300 05/16/22 1307  BP: 104/68   Pulse:    Resp: 15   Temp:    SpO2:  90%     Intake/Output Summary (Last 24 hours) at 05/16/2022 1339 Last data filed at 05/16/2022 1101 Gross per 24 hour  Intake 1025.98 ml  Output 1950 ml  Net -924.02 ml    General:  Well appearing. No respiratory difficulty HEENT: normal Neck: supple. no JVD. Carotids 2+ bilat; no bruits. No lymphadenopathy or thryomegaly appreciated. Cor: PMI nondisplaced. Regular rate & rhythm. No rubs, gallops or murmurs. Lungs: clear Abdomen: soft, nontender, nondistended. No hepatosplenomegaly. No bruits or masses. Good bowel sounds. Extremities: no cyanosis, clubbing, rash, edema Neuro: alert & oriented x 3, cranial nerves grossly intact. moves all 4 extremities w/o difficulty. Affect pleasant.  ECG: Atrial fibrillation with rapid ventricular response rate nonspecific ST-T changes  Results for orders placed or performed during the hospital encounter of 05/13/22 (from the past 24 hour(s))  Glucose, capillary     Status: Abnormal   Collection Time: 05/15/22  3:46 PM  Result Value Ref Range   Glucose-Capillary 102 (H) 70 - 99 mg/dL  Blood gas, arterial     Status: Abnormal   Collection Time: 05/15/22  4:30 PM  Result Value Ref Range   FIO2 40 %   Delivery systems BILEVEL POSITIVE AIRWAY PRESSURE    Inspiratory PAP 16 cmH2O   Expiratory PAP 6 cmH2O   pH, Arterial 7.38 7.35 - 7.45   pCO2 arterial 54 (H) 32 - 48 mmHg   pO2, Arterial 78 (L) 83 - 108 mmHg    Bicarbonate 31.9 (H) 20.0 - 28.0 mmol/L   Acid-Base Excess 5.3 (H) 0.0 - 2.0 mmol/L   O2 Saturation 95.2 %   Patient temperature 37.0    Collection site RIGHT RADIAL    Allens test (pass/fail) PASS PASS   Mechanical Rate 20   Blood gas, venous     Status: Abnormal   Collection Time: 05/15/22  7:50 PM  Result Value Ref Range   FIO2 35 %  O2 Content 45.0 L/min   Delivery systems HI FLOW NASAL CANNULA    pH, Ven 7.37 7.25 - 7.43   pCO2, Ven 55 44 - 60 mmHg   pO2, Ven 70 (H) 32 - 45 mmHg   Bicarbonate 31.8 (H) 20.0 - 28.0 mmol/L   Acid-Base Excess 5.0 (H) 0.0 - 2.0 mmol/L   O2 Saturation 93.9 %   Patient temperature 37.0    Collection site VEIN   Glucose, capillary     Status: Abnormal   Collection Time: 05/15/22  7:51 PM  Result Value Ref Range   Glucose-Capillary 125 (H) 70 - 99 mg/dL  Glucose, capillary     Status: Abnormal   Collection Time: 05/15/22 11:26 PM  Result Value Ref Range   Glucose-Capillary 106 (H) 70 - 99 mg/dL  Glucose, capillary     Status: Abnormal   Collection Time: 05/16/22  3:13 AM  Result Value Ref Range   Glucose-Capillary 103 (H) 70 - 99 mg/dL  CBC     Status: Abnormal   Collection Time: 05/16/22  4:27 AM  Result Value Ref Range   WBC 5.1 4.0 - 10.5 K/uL   RBC 4.83 4.22 - 5.81 MIL/uL   Hemoglobin 14.2 13.0 - 17.0 g/dL   HCT 46.0 39.0 - 52.0 %   MCV 95.2 80.0 - 100.0 fL   MCH 29.4 26.0 - 34.0 pg   MCHC 30.9 30.0 - 36.0 g/dL   RDW 16.1 (H) 11.5 - 15.5 %   Platelets 144 (L) 150 - 400 K/uL   nRBC 0.0 0.0 - 0.2 %  Renal function panel     Status: Abnormal   Collection Time: 05/16/22  4:27 AM  Result Value Ref Range   Sodium 142 135 - 145 mmol/L   Potassium 4.8 3.5 - 5.1 mmol/L   Chloride 108 98 - 111 mmol/L   CO2 29 22 - 32 mmol/L   Glucose, Bld 111 (H) 70 - 99 mg/dL   BUN 34 (H) 8 - 23 mg/dL   Creatinine, Ser 0.79 0.61 - 1.24 mg/dL   Calcium 8.9 8.9 - 10.3 mg/dL   Phosphorus 2.7 2.5 - 4.6 mg/dL   Albumin 3.2 (L) 3.5 - 5.0 g/dL   GFR,  Estimated >60 >60 mL/min   Anion gap 5 5 - 15  Procalcitonin     Status: None   Collection Time: 05/16/22  4:27 AM  Result Value Ref Range   Procalcitonin <0.10 ng/mL  Magnesium     Status: None   Collection Time: 05/16/22  4:27 AM  Result Value Ref Range   Magnesium 2.4 1.7 - 2.4 mg/dL  Glucose, capillary     Status: Abnormal   Collection Time: 05/16/22  7:37 AM  Result Value Ref Range   Glucose-Capillary 103 (H) 70 - 99 mg/dL  Blood gas, venous     Status: Abnormal   Collection Time: 05/16/22  9:33 AM  Result Value Ref Range   pH, Ven 7.39 7.25 - 7.43   pCO2, Ven 71 (HH) 44 - 60 mmHg   pO2, Ven 53 (H) 32 - 45 mmHg   Bicarbonate 43.0 (H) 20.0 - 28.0 mmol/L   Acid-Base Excess 14.5 (H) 0.0 - 2.0 mmol/L   O2 Saturation 77.3 %   Patient temperature 37.0    Collection site VEIN   Glucose, capillary     Status: Abnormal   Collection Time: 05/16/22 11:29 AM  Result Value Ref Range   Glucose-Capillary 121 (H) 70 - 99 mg/dL  DG Chest Port 1 View  Result Date: 05/16/2022 CLINICAL DATA:  Acute respiratory failure.  Hypoxia. EXAM: PORTABLE CHEST 1 VIEW COMPARISON:  May 14, 2022 FINDINGS: Cardiomegaly and increased interstitial opacity, worsened in the interval. No pneumothorax. Mild opacity in left base is stable, probably atelectasis. Infiltrate considered less likely. No other acute abnormalities. IMPRESSION: 1. Cardiomegaly and pulmonary edema. 2. Mild opacity in left base is favored to represent atelectasis. Infiltrate considered less likely. Electronically Signed   By: Dorise Bullion III M.D.   On: 05/16/2022 10:34     ASSESSMENT AND PLAN: Atrial fibrillation with rapid ventricular rate.  Congestive heart failure.  Patient was given metoprolol and has the ventricular rate dropped to 60 to 70 bpm.  Patient remains in A-fib.  Advised p.o. amiodarone.  Anjalee Cope A

## 2022-05-16 NOTE — Progress Notes (Signed)
NAME:  Christopher Mendez, MRN:  [id number], DOB:  1950-07-10, LOS: 2 ADMISSION DATE:  05/13/2022, CONSULTATION DATE: 05/14/2022 REFERRING MD: Dr. Francine Graven, CHIEF COMPLAINT: Acute respiratory distress  Brief Pt Description / Synopsis:  71 year old male admitted with acute hypoxic and hypercapnic respiratory failure in the setting of acute COPD exacerbation due to Influenza A infection requiring BiPAP.  History of Present Illness:  Christopher Mendez is a 71 year old male with a past medical history significant for COPD and hypertension who presented to [facility] ED on 05/14/2022 due to acute respiratory distress, weakness, and fall.  Patient is currently lethargic and unable to contribute to history and no family is currently available, therefore history is obtained from ED and nursing notes.  Per chart review, the patient and his wife had been sick for a few days with upper respiratory symptoms including cough, fever, chills.  1 day prior to presentation the husband became very weak and extremely confused, and subsequently fell later that night, prompting her to activate EMS.  Upon EMS arrival he was noted to be in respiratory distress and hypoxic with O2 sats in the 60s.  He was placed on nonrebreather mask with improvement and O2 sats to 88%.  He was also noted to be febrile with temperature of 101.4.  ED Course: Initial Vital Signs: Temperature 98.8 F, pulse 86, respiratory rate 22, blood pressure 113/53, SpO2 93% Significant Labs: Glucose 125, lactic acid 1.9, procalcitonin 0.13, WBC 9.3 Positive for influenza A VBG on BiPAP: pH 7.22/pCO2 74/pO2 38/bicarb 30.3 Imaging Chest X-ray>>FINDINGS: There is central pulmonary vascular congestion. There is no lung consolidation, pleural effusion or pneumothorax. There is minimal atelectasis in the left lung base. Cardiomediastinal silhouette is within normal limits. No acute fractures are seen. CT Chest w/o Contrast>>IMPRESSION: Mild right posterior  basilar subsegmental atelectasis or infiltrate is noted. Extensive coronary artery calcifications are noted suggesting coronary disease. Aortic Atherosclerosis CT Head w/o contrast>>IMPRESSION: No acute intracranial process. Medications Administered: EMS gave 2 g mag sulfate, 125 Solu-Medrol, 1 DuoNeb, 1.2 L LR bolus  In ED: 1 mg of Ativan, bronchodilators, IV ceftriaxone and Zithromax  Given his work of breathing he was placed on BiPAP.  Hospitalist were asked to admit the patient for further workup and treatment.  PCCM was asked to evaluate the patient given high risk for intubation.  Please see "significant hospital events" section below for full detailed hospital course.  Pertinent  Medical History   Past Medical History:  Diagnosis Date   Cardiac enlargement    COPD (chronic obstructive pulmonary disease) ([place])    HTN (hypertension)     Micro Data:  12/21: SARS-CoV-2 PCR>> negative 12/21: + Influenza A 12/21: Blood culture x 2>>NGTD 12/21: Urine>> multiple species, suggest recollection 12/21: MRSA PCR>>negative  Antimicrobials:  Azithromycin 12/21>>12/22 Ceftriaxone 12/21>>12/22 Oseltamivir 12/21>>  Significant Hospital Events: Including procedures, antibiotic start and stop dates in addition to other pertinent events   12/21: Admitted by hospitalist.  Requiring BiPAP.  ABG with hypercapnia, high risk for intubation.  PCCM consulted. 12/22: Off of BiPAP, VBG improved, hypercapnia resolved. PCCM initially signing off, however pt became agitated/removing supplemental O2 with desaturations, somnolent after Ativan, placed back on BiPAP.  PCCM will continue to follow. 12/23: Pt with agitation overnight placed on precedex gtt.  PCCM team will sign off if you need further assistance please contact us utilizing the number in AMION  Interim History / Subjective:  Pt now off precedex gtt and calm/cooperative.  Cardiac rhythm atrial fibrillation hr 106 to 130's  despite 5 mg IV  metoprolol x1 dose.  He is currently tolerating HHFNC '@40'$ %/40L   Objective   Blood pressure (!) 122/53, pulse 96, temperature 97.6 F (36.4 C), temperature source Axillary, resp. rate 18, height 6' (1.829 m), weight 86.7 kg, SpO2 91 %.    FiO2 (%):  [33 %-42 %] 42 %   Intake/Output Summary (Last 24 hours) at 05/16/2022 0854 Last data filed at 05/16/2022 0755 Gross per 24 hour  Intake 874.48 ml  Output 1250 ml  Net -375.52 ml   Filed Weights   05/14/22 0012 05/14/22 2037 05/14/22 2100  Weight: 92.2 kg 86.7 kg 86.7 kg    Examination: General: Acute on chronically ill-appearing male, laying in bed, on HHFNC 40% FiO2, no acute distress HENT: Atraumatic, normocephalic, neck supple, no JVD Lungs: Clear diminished throughout, no wheezing noted, even, non-labored Cardiovascular: Irregular irregular, no murmurs, rubs, gallops, 2+ radial/1+ distal pulses, no edema  Abdomen: Soft, nontender, nondistended, no guarding or rebound tenderness, bowel sounds positive x 4 Extremities: Normal bulk and tone, no deformities Neuro: Lethargic, following commands, PERRLA GU: External male catheter in place   Resolved Hospital Problem list     Assessment & Plan:   Acute Hypoxic & Hypercapnic Respiratory Failure in setting of AECOPD due to Influenza Pneumonia - Supplemental O2 as needed to maintain O2 sats 88 to 92% - BiPAP, wean as tolerated - Follow intermittent Chest X-ray & ABG as needed - Bronchodilators & Pulmicort nebs - Will transition from iv steroids to prednisone  - Diuresis as BP and renal function permits - Pulmonary toilet as able - Nicotine patch   New onset atrial fibrillation  - Continuous telemetry monitoring  - Will consult Cardiology appreciate input  - Will start metoprolol 12.5 mg bid - Echo pending   Influenza A pneumonia Possible superimposed CAP - Trend WBC and monitor fever curve  - Trend PCT  - Follow cultures  - Continue tamiflu  - Received ceftriaxone and  azithromycin  Acute metabolic encephalopathy CO2 Narcosis   CT Head 05/14/22: negative  - Treatment of hypercapnia and metabolic derangements as outlined above - Provide supportive care - Promote normal sleep/wake cycle and family presence - Avoid sedating meds as able (NO MORE ATIVAN)  - Pt's daughter previously reported pt does not abuse ETOH  Best Practice (right click and "Reselect all SmartList Selections" daily)   Diet/type: 2 gram Na+ diet  DVT prophylaxis: LMWH GI prophylaxis: N/A Lines: N/A Foley:  N/A Code Status:  full code Last date of multidisciplinary goals of care discussion [05/16/22]  Labs   CBC: Recent Labs  Lab 05/14/22 0002 05/15/22 0320 05/16/22 0427  WBC 9.3 7.9 5.1  NEUTROABS 7.7  --   --   HGB 15.0 14.5 14.2  HCT 47.2 46.0 46.0  MCV 94.4 95.6 95.2  PLT 186 159 144*    Basic Metabolic Panel: Recent Labs  Lab 05/14/22 0002 05/15/22 0320 05/16/22 0427  NA 139 142 142  K 4.3 4.5 4.8  CL 105 107 108  CO2 '27 27 29  '$ GLUCOSE 125* 117* 111*  BUN 27* 39* 34*  CREATININE 1.05 1.04 0.79  CALCIUM 8.9 8.8* 8.9  MG  --   --  2.4  PHOS  --   --  2.7   GFR: Estimated Creatinine Clearance: 90.3 mL/min (by C-G formula based on SCr of 0.79 mg/dL). Recent Labs  Lab 05/14/22 0002 05/14/22 0935 05/15/22 0320 05/16/22 0427  PROCALCITON  --  0.13  --  <  0.10  WBC 9.3  --  7.9 5.1  LATICACIDVEN 1.9  --   --   --     Liver Function Tests: Recent Labs  Lab 05/14/22 0002 05/16/22 0427  AST 22  --   ALT 9  --   ALKPHOS 45  --   BILITOT 1.1  --   PROT 7.0  --   ALBUMIN 3.9 3.2*   No results for input(s): "LIPASE", "AMYLASE" in the last 168 hours. No results for input(s): "AMMONIA" in the last 168 hours.  ABG    Component Value Date/Time   PHART 7.38 05/15/2022 1630   PCO2ART 54 (H) 05/15/2022 1630   PO2ART 78 (L) 05/15/2022 1630   HCO3 31.8 (H) 05/15/2022 1950   O2SAT 93.9 05/15/2022 1950     Coagulation Profile: No results for  input(s): "INR", "PROTIME" in the last 168 hours.  Cardiac Enzymes: No results for input(s): "CKTOTAL", "CKMB", "CKMBINDEX", "TROPONINI" in the last 168 hours.  HbA1C: Hgb A1c MFr Bld  Date/Time Value Ref Range Status  05/14/2022 09:35 AM 6.0 (H) 4.8 - 5.6 % Final    Comment:    (NOTE)         Prediabetes: 5.7 - 6.4         Diabetes: >6.4         Glycemic control for adults with diabetes: <7.0     CBG: Recent Labs  Lab 05/15/22 1546 05/15/22 1951 05/15/22 2326 05/16/22 0313 05/16/22 0737  GLUCAP 102* 125* 106* 103* 103*    Review of Systems:   Unable to assess due to AMS   Past Medical History:  He,  has a past medical history of Cardiac enlargement, COPD (chronic obstructive pulmonary disease) ([place]), and HTN (hypertension).   Surgical History:   Past Surgical History:  Procedure Laterality Date   AMPUTATION TOE Right 07/14/2019   Procedure: AMPUTATION TOE RAY RIGHT 5TH PARTIAL;  Surgeon: Caroline More, DPM;  Location: [facility] ORS;  Service: Podiatry;  Laterality: Right;   EYE SURGERY       Social History:   reports that he has been smoking cigarettes. He has a 56.00 pack-year smoking history. He has never used smokeless tobacco. He reports current alcohol use of about 42.0 standard drinks of alcohol per week. He reports that he does not use drugs.   Family History:  His family history includes Diabetes in his father; Hypertension in his mother.   Allergies Allergies  Allergen Reactions   Flomax [Tamsulosin Hcl] Rash   Myrbetriq [Mirabegron] Rash     Home Medications  Prior to Admission medications   Medication Sig Start Date End Date Taking? Authorizing Provider  albuterol (VENTOLIN HFA) 108 (90 Base) MCG/ACT inhaler Inhale 2 puffs into the lungs every 6 (six) hours as needed for wheezing or shortness of breath.   Yes [provider]  amLODipine (NORVASC) 10 MG tablet Take 10 mg by mouth daily. 12/12/20  Yes [provider]  Ascorbic Acid  (VITAMIN C) 1000 MG tablet Take 1,000 mg by mouth daily.   Yes [provider]  aspirin-acetaminophen-caffeine (EXCEDRIN MIGRAINE) [phone] MG tablet Take 3 tablets by mouth daily as needed (Body aches).   Yes [provider]  cholecalciferol (VITAMIN D3) 25 MCG (1000 UNIT) tablet Take 1,000 Units by mouth daily.   Yes [provider]  Fluticasone-Salmeterol (ADVAIR) 500-50 MCG/DOSE AEPB Inhale 1 puff into the lungs 2 (two) times daily.   Yes [provider]  hydrochlorothiazide (HYDRODIURIL) 25 MG tablet  Take 25 mg by mouth daily.   Yes [provider]  ipratropium-albuterol (DUONEB) 0.5-2.5 (3) MG/3ML SOLN Take 3 mLs by nebulization every 6 (six) hours as needed. 03/17/22  Yes [provider]  lisinopril (PRINIVIL,ZESTRIL) 40 MG tablet Take 40 mg by mouth daily.   Yes [provider]  Melatonin 10 MG CAPS Take 10 mg by mouth at bedtime as needed.   Yes [provider]  metFORMIN (GLUCOPHAGE) 1000 MG tablet Take 1,000 mg by mouth 2 (two) times daily. 01/28/21  Yes [provider]  omeprazole (PRILOSEC) 20 MG capsule Take 20 mg by mouth daily.   Yes [provider]  tiotropium (SPIRIVA) 18 MCG inhalation capsule Place 18 mcg into inhaler and inhale daily.   Yes [provider]  gabapentin (NEURONTIN) 100 MG capsule 100 mg in the morning, at noon, and at bedtime. Patient not taking: Reported on 05/14/2022 10/31/19   [provider]  rosuvastatin (CRESTOR) 20 MG tablet Take 20 mg by mouth daily. Patient not taking: Reported on 05/14/2022 10/28/20   [provider]  triamcinolone cream (KENALOG) 0.1 % Apply 1 application topically 2 (two) times daily as needed (dry skin).  Patient not taking: Reported on 05/14/2022    [provider]     Critical care time: 32 minutes     Donell Beers, [place] Pager [phone] (please enter 7 digits) PCCM Consult  Pager [phone] (please enter 7 digits)

## 2022-05-17 ENCOUNTER — Inpatient Hospital Stay
Admit: 2022-05-17 | Discharge: 2022-05-17 | Disposition: A | Discharge status: Home / Self Care | Payer: Medicare Other | Attending: Cardiovascular Disease | Admitting: Cardiovascular Disease

## 2022-05-17 DIAGNOSIS — I4891 Unspecified atrial fibrillation: Secondary | ICD-10-CM | POA: Diagnosis not present

## 2022-05-17 DIAGNOSIS — J9601 Acute respiratory failure with hypoxia: Secondary | ICD-10-CM | POA: Diagnosis not present

## 2022-05-17 DIAGNOSIS — J441 Chronic obstructive pulmonary disease with (acute) exacerbation: Secondary | ICD-10-CM | POA: Diagnosis not present

## 2022-05-17 DIAGNOSIS — I5033 Acute on chronic diastolic (congestive) heart failure: Secondary | ICD-10-CM

## 2022-05-17 DIAGNOSIS — J09X1 Influenza due to identified novel influenza A virus with pneumonia: Secondary | ICD-10-CM | POA: Diagnosis not present

## 2022-05-17 LAB — BLOOD GAS, ARTERIAL
Acid-Base Excess: 8 mmol/L — ABNORMAL HIGH (ref 0.0–2.0)
Acid-Base Excess: 11.2 mmol/L — ABNORMAL HIGH (ref 0.0–2.0)
Bicarbonate: 33.9 mmol/L — ABNORMAL HIGH (ref 20.0–28.0)
Bicarbonate: 36.6 mmol/L — ABNORMAL HIGH (ref 20.0–28.0)
FIO2: 0.44 %
FIO2: 42 %
O2 Content: 40 L/min
O2 Saturation: 94.4 %
O2 Saturation: 96.2 %
Patient temperature: 37
Patient temperature: 37
pCO2 arterial: 51 mmHg — ABNORMAL HIGH (ref 32–48)
pCO2 arterial: 48 mmHg (ref 32–48)
pH, Arterial: 7.43 (ref 7.35–7.45)
pH, Arterial: 7.49 — ABNORMAL HIGH (ref 7.35–7.45)
pO2, Arterial: 74 mmHg — ABNORMAL LOW (ref 83–108)
pO2, Arterial: 84 mmHg (ref 83–108)

## 2022-05-17 LAB — ECHOCARDIOGRAM COMPLETE
AR max vel: 2.98 cm2
AV Area VTI: 3.2 cm2
AV Area mean vel: 3.23 cm2
AV Mean grad: 4 mmHg
AV Peak grad: 8.8 mmHg
Ao pk vel: 1.48 m/s
Area-P 1/2: 3.96 cm2
Height: 72 in
S' Lateral: 4.3 cm
Weight: 3058.22 oz

## 2022-05-17 LAB — RENAL FUNCTION PANEL
Albumin: 3.6 g/dL (ref 3.5–5.0)
Anion gap: 6 (ref 5–15)
BUN: 30 mg/dL — ABNORMAL HIGH (ref 8–23)
CO2: 33 mmol/L — ABNORMAL HIGH (ref 22–32)
Calcium: 9.2 mg/dL (ref 8.9–10.3)
Chloride: 103 mmol/L (ref 98–111)
Creatinine, Ser: 0.84 mg/dL (ref 0.61–1.24)
GFR, Estimated: >60 mL/min (ref >60)
Glucose, Bld: 90 mg/dL (ref 70–99)
Phosphorus: 2.4 mg/dL — ABNORMAL LOW (ref 2.5–4.6)
Potassium: 4.5 mmol/L (ref 3.5–5.1)
Sodium: 142 mmol/L (ref 135–145)

## 2022-05-17 LAB — CBC
HCT: 49.3 % (ref 39.0–52.0)
Hemoglobin: 15.3 g/dL (ref 13.0–17.0)
MCH: 29.3 pg (ref 26.0–34.0)
MCHC: 31 g/dL (ref 30.0–36.0)
MCV: 94.3 fL (ref 80.0–100.0)
Platelets: 157 10*3/uL (ref 150–400)
RBC: 5.23 MIL/uL (ref 4.22–5.81)
RDW: 15.9 % — ABNORMAL HIGH (ref 11.5–15.5)
WBC: 5.3 10*3/uL (ref 4.0–10.5)
nRBC: 0 % (ref 0.0–0.2)

## 2022-05-17 LAB — PROCALCITONIN: Procalcitonin: <0.1 ng/mL

## 2022-05-17 LAB — GLUCOSE, CAPILLARY
Glucose-Capillary: 98 mg/dL (ref 70–99)
Glucose-Capillary: 102 mg/dL — ABNORMAL HIGH (ref 70–99)
Glucose-Capillary: 131 mg/dL — ABNORMAL HIGH (ref 70–99)
Glucose-Capillary: 123 mg/dL — ABNORMAL HIGH (ref 70–99)

## 2022-05-17 MED ORDER — APIXABAN 5 MG PO TABS
5.0000 mg | ORAL_TABLET | Freq: Two times a day (BID) | ORAL | Status: DC
  Administered 2022-05-17 – 2022-05-25 (×17): 5 mg via ORAL
  Filled 2022-05-17 (×18): qty 1

## 2022-05-17 MED ORDER — PERFLUTREN LIPID MICROSPHERE
1.0000 mL | INTRAVENOUS | Status: AC | PRN
  Administered 2022-05-17: 2 mL via INTRAVENOUS

## 2022-05-17 MED ORDER — DEXMEDETOMIDINE HCL IN NACL 400 MCG/100ML IV SOLN
0.4000 ug/kg/h | INTRAVENOUS | Status: DC
  Administered 2022-05-17: 0.4 ug/kg/h via INTRAVENOUS
  Administered 2022-05-18: 0.2 ug/kg/h via INTRAVENOUS
  Administered 2022-05-18 – 2022-05-19 (×2): 0.4 ug/kg/h via INTRAVENOUS
  Administered 2022-05-20: 0.2 ug/kg/h via INTRAVENOUS
  Filled 2022-05-17 (×5): qty 100

## 2022-05-17 MED ORDER — DIPHENHYDRAMINE HCL 25 MG PO CAPS: 25.0000 mg | ORAL_CAPSULE | Freq: Every evening | ORAL | Status: DC | PRN

## 2022-05-17 MED ORDER — AMIODARONE HCL 200 MG PO TABS
200.0000 mg | ORAL_TABLET | Freq: Every day | ORAL | Status: DC
  Administered 2022-05-17 – 2022-05-25 (×9): 200 mg via ORAL
  Filled 2022-05-17 (×9): qty 1

## 2022-05-17 NOTE — Progress Notes (Signed)
Pt has new confusion/agitation. Tring to pull at lines and get out of bed. MD made aware. Pt has been refusing bi-pap. New orders received. Pt will remain in stepdown.

## 2022-05-17 NOTE — Progress Notes (Signed)
SUBJECTIVE: Patient denies any chest pain    Vitals:   05/17/22 0900 05/17/22 1000 05/17/22 1033 05/17/22 1100  BP: 112/62 137/68 129/70 137/76  Pulse: 71 70 69 73  Resp: 19 (!) 21 17 (!) 22  Temp:      TempSrc:      SpO2: 96% 99% 93% 93%  Weight:      Height:        Intake/Output Summary (Last 24 hours) at 05/17/2022 1113 Last data filed at 05/17/2022 1000 Gross per 24 hour  Intake 120 ml  Output 1500 ml  Net -1380 ml    LABS: Basic Metabolic Panel: Recent Labs    05/16/22 0427 05/17/22 0450  NA 142 142  K 4.8 4.5  CL 108 103  CO2 29 33*  GLUCOSE 111* 90  BUN 34* 30*  CREATININE 0.79 0.84  CALCIUM 8.9 9.2  MG 2.4  --   PHOS 2.7 2.4*   Liver Function Tests: Recent Labs    05/16/22 0427 05/17/22 0450  ALBUMIN 3.2* 3.6   No results for input(s): "LIPASE", "AMYLASE" in the last 72 hours. CBC: Recent Labs    05/16/22 0427 05/17/22 0450  WBC 5.1 5.3  HGB 14.2 15.3  HCT 46.0 49.3  MCV 95.2 94.3  PLT 144* 157   Cardiac Enzymes: No results for input(s): "CKTOTAL", "CKMB", "CKMBINDEX", "TROPONINI" in the last 72 hours. BNP: Invalid input(s): "POCBNP" D-Dimer: No results for input(s): "DDIMER" in the last 72 hours. Hemoglobin A1C: No results for input(s): "HGBA1C" in the last 72 hours. Fasting Lipid Panel: No results for input(s): "CHOL", "HDL", "LDLCALC", "TRIG", "CHOLHDL", "LDLDIRECT" in the last 72 hours. Thyroid Function Tests: No results for input(s): "TSH", "T4TOTAL", "T3FREE", "THYROIDAB" in the last 72 hours.  Invalid input(s): "FREET3" Anemia Panel: No results for input(s): "VITAMINB12", "FOLATE", "FERRITIN", "TIBC", "IRON", "RETICCTPCT" in the last 72 hours.   PHYSICAL EXAM General: Well developed, well nourished, in no acute distress HEENT:  Normocephalic and atramatic Neck:  No JVD.  Lungs: Clear bilaterally to auscultation and percussion. Heart: HRRR . Normal S1 and S2 without gallops or murmurs.  Abdomen: Bowel sounds are  positive, abdomen soft and non-tender  Msk:  Back normal, normal gait. Normal strength and tone for age. Extremities: No clubbing, cyanosis or edema.   Neuro: Alert and oriented X 3. Psych:  Good affect, responds appropriately  TELEMETRY: Normal sinus rhythm  ASSESSMENT AND PLAN: Atrial fibrillation with rapid ventricular response rate.  Patient has converted to normal sinus rhythm.  Continue Eliquis and p.o. amiodarone.  Patient can be transferred to medical floor on telemetry bed.  Principal Problem:   Influenza A with pneumonia Active Problems:   Acute metabolic encephalopathy   Acute respiratory failure with hypoxia and hypercapnia (HCC)   COPD exacerbation (HCC)   HTN (hypertension)   Controlled type 2 diabetes mellitus without complication, without long-term current use of insulin (HCC)   Tobacco abuse   Overweight (BMI 25.0-29.9)   Atrial fibrillation with RVR (HCC)    Christopher Mendez A, MD, [facility] 05/17/2022 11:13 AM

## 2022-05-17 NOTE — Consult Note (Signed)
[place] for Apixaban Indication: atrial fibrillation  Allergies  Allergen Reactions   Flomax [Tamsulosin Hcl] Rash   Myrbetriq [Mirabegron] Rash    Patient Measurements: Height: 6' (182.9 cm) Weight: 86.7 kg (191 lb 2.2 oz) IBW/kg (Calculated) : 77.6  Vital Signs: Temp: 97.8 F (36.6 C) (12/24 1200) Temp Source: Oral (12/24 1200) BP: 130/73 (12/24 1200) Pulse Rate: 66 (12/24 1200)  Labs: Recent Labs    05/15/22 0320 05/16/22 0427 05/17/22 0450  HGB 14.5 14.2 15.3  HCT 46.0 46.0 49.3  PLT 159 144* 157  CREATININE 1.04 0.79 0.84    Estimated Creatinine Clearance: 86 mL/min (by C-G formula based on SCr of 0.84 mg/dL).   Medical History: Past Medical History:  Diagnosis Date   Cardiac enlargement    COPD (chronic obstructive pulmonary disease) (HCC)    HTN (hypertension)     Medications:  Scheduled:   amiodarone  200 mg Oral Daily   budesonide (PULMICORT) nebulizer solution  0.25 mg Nebulization BID   Chlorhexidine Gluconate Cloth  6 each Topical Daily   insulin aspart  0-15 Units Subcutaneous TID AC & HS   ipratropium-albuterol  3 mL Nebulization Q6H   metoprolol tartrate  25 mg Oral BID   nicotine  21 mg Transdermal Daily   oseltamivir  75 mg Oral BID   predniSONE  40 mg Oral Daily   Infusions:  PRN: acetaminophen **OR** acetaminophen, ipratropium-albuterol, ondansetron **OR** ondansetron (ZOFRAN) IV  Assessment: Christopher Mendez is a 71 y.o. male presenting with hypoxia. PMH significant for TUD, COPD, HTN, T2DM. Patient was not on [facility] PTA per chart review. Patient found to have new onset Afib this admision. CHADSVASc 3. Pharmacy has been consulted to dose apixaban.    Plan:  Discontinue lovenox Initiate apixaban 5 mg BID Pharmacy will sign off and continue to monitor peripherally   Gretel Acre, PharmD PGY1 Pharmacy Resident 05/17/2022 1:20 PM

## 2022-05-17 NOTE — Progress Notes (Signed)
*  PRELIMINARY RESULTS* Echocardiogram 2D Echocardiogram has been performed.  Christopher Mendez 05/17/2022, 12:19 PM

## 2022-05-17 NOTE — Assessment & Plan Note (Signed)
Echocardiogram notes grade 3 diastolic dysfunction.  Will start diuresis once patient is calm.

## 2022-05-17 NOTE — Progress Notes (Signed)
[facility] Progress Note  Patient: Christopher Mendez    RFX:[id number]  DOA: 05/13/2022    Date of Service: the patient was seen and examined on 05/17/2022  Brief hospital course: 71 year old male with past medical history of ongoing tobacco abuse, COPD and hypertension brought to the emergency room on the early morning hours of 12/21 for cough, fever and progressively worsening shortness of breath times several days.  The emergency room, patient noted to be significantly hypoxic and febrile and positive for influenza A.  Patient also noted to be confused and ABG noted hypercapnia.  Patient admitted to the hospitalist service and critical care consulted.  Patient placed on BiPAP and overnight, mentation and hypercapnia improved.  However, by afternoon of 12/22, patient more agitated and combative and started on Precedex.  BiPAP placed to prevent hypercapnia.  By 12/23, patient's mentation and breathing improved and able to be taken off of BiPAP on high flow oxygen.  Patient also went into new onset rapid atrial fibrillation and cardiology consulted.  Heart rate better controlled and patient has gone back into normal sinus rhythm.  Still on large amount of oxygen support, currently 40 L high flow.  Continues to have issues with intermittent confusion   Assessment and Plan: * Influenza A with pneumonia Patient presents for evaluation of altered mental status and weakness and his influenza A PCR is positive.  Procalcitonin essentially normal.  Will discontinue antibiotics.  CT scan of chest has atelectasis versus infiltrate  Acute respiratory failure with hypoxia and hypercapnia (HCC) Improved on BiPAP and currently on high flow oxygen.  Has not had much improvement of some of this may be secondary to some volume overload now secondary to A-fib.  Continue nightly BiPAP.  Atrial fibrillation with RVR ([place]) New onset.  Cardiology consulted.  Echocardiogram notes bilateral severe atrial  dilatation.  Titrating up metoprolol for heart rate control.  Started on anticoagulation.  Acute metabolic encephalopathy Initially quite lethargic.  At baseline, awake alert and oriented.  Initially somewhat obtunded.  Following BiPAP, much more awake, however by early afternoon, agitated requiring Precedex.  Confusion is intermittent, likely due to hypercarbia now.  Occurring again this evening.  Repeat blood gas.  May have to go back on Precedex again.  COPD exacerbation (HCC) Acute exacerbation secondary to influenza A infection Continue bronchodilator therapy and inhaled steroids  HTN (hypertension) Hold oral antihypertensive agents Place patient on as needed hydralazine for systolic blood pressure greater than 160  Controlled type 2 diabetes mellitus without complication, without long-term current use of insulin (HCC) Check blood sugars every 4 hours while patient is n.p.o. CBGs stable, even despite steroids  Tobacco abuse Nicotine patch  Overweight (BMI 25.0-29.9) Meets criteria with BMI greater than 25  Acute on chronic diastolic CHF (congestive heart failure) (HCC) Echocardiogram notes grade 3 diastolic dysfunction.  Will start diuresis once patient is calm.   Pressure ulcer: Stage II on sacrum, present on admission     Body mass index is 25.92 kg/m.    Pressure Injury 05/14/22 Sacrum Mid Deep Tissue Pressure Injury - Purple or maroon localized area of discolored intact skin or blood-filled blister due to damage of underlying soft tissue from pressure and/or shear. (Active)  05/14/22 2030  Location: Sacrum  Location Orientation: Mid  Staging: Deep Tissue Pressure Injury - Purple or maroon localized area of discolored intact skin or blood-filled blister due to damage of underlying soft tissue from pressure and/or shear.  Wound Description (Comments):   Present on Admission:  Yes  Dressing Type Foam - Lift dressing to assess site every shift 05/17/22 1400      Consultants: Critical care Cardiology  Procedures: Echocardiogram notes biatrial severe enlargement with grade 3 diastolic dysfunction  Antimicrobials: IV Rocephin and Zithromax 12/21 - 12/22  Code Status: Full code   Subjective: Seen earlier when he is more lucid, feels like breathing is a little bit better.  Objective: Mildly tachypneic Vitals:   05/17/22 1600 05/17/22 1700  BP: 133/87 124/88  Pulse: 70 63  Resp: (!) 22 18  Temp: 98.2 F (36.8 C)   SpO2: 93% 94%    Intake/Output Summary (Last 24 hours) at 05/17/2022 1801 Last data filed at 05/17/2022 1200 Gross per 24 hour  Intake 120 ml  Output 1300 ml  Net -1180 ml    Filed Weights   05/14/22 0012 05/14/22 2037 05/14/22 2100  Weight: 92.2 kg 86.7 kg 86.7 kg   Body mass index is 25.92 kg/m.  Exam:  General: Oriented x 2, fatigued HEENT: Normocephalic, atraumatic, mucous membranes slightly dry Cardiovascular: Sinus tachycardia Respiratory: Bilateral end expiratory wheeze Abdomen: Soft, nontender, nondistended, hypoactive bowel sounds Musculoskeletal: No clubbing or cyanosis, trace pitting edema Skin: No skin breaks, tears or lesions Psychiatry: Appropriate, no evidence of psychoses Neurology: No focal deficits  Data Reviewed: ABG from this morning notes pCO2 of 51 with bicarb of 34.  Normal procalcitonin and normal white blood cell count.  Disposition:  Status is: Inpatient Remains inpatient appropriate because:  -Improvement in oxygen -Improvement in mentation    Anticipated discharge date: 12/28 Family Communication: Wife at bedside DVT Prophylaxis:  apixaban (ELIQUIS) tablet 5 mg   35 minutes in the care of this critically ill patient including medical decision making, discussion of care with consultants, evaluation lab work and physical examination at the bedside Author: Annita Brod ,MD 05/17/2022 6:01 PM  To reach On-call, see care teams to locate the attending and reach out  via [email or web address]. Between 7PM-7AM, please contact night-coverage If you still have difficulty reaching the attending provider, please page the [facility] (Director on Call) for [facility] on amion for assistance.

## 2022-05-18 DIAGNOSIS — J09X1 Influenza due to identified novel influenza A virus with pneumonia: Secondary | ICD-10-CM | POA: Diagnosis not present

## 2022-05-18 DIAGNOSIS — I4891 Unspecified atrial fibrillation: Secondary | ICD-10-CM | POA: Diagnosis not present

## 2022-05-18 DIAGNOSIS — J441 Chronic obstructive pulmonary disease with (acute) exacerbation: Secondary | ICD-10-CM | POA: Diagnosis not present

## 2022-05-18 DIAGNOSIS — J9601 Acute respiratory failure with hypoxia: Secondary | ICD-10-CM | POA: Diagnosis not present

## 2022-05-18 LAB — CBC
HCT: 49 % (ref 39.0–52.0)
Hemoglobin: 15.8 g/dL (ref 13.0–17.0)
MCH: 29.8 pg (ref 26.0–34.0)
MCHC: 32.2 g/dL (ref 30.0–36.0)
MCV: 92.3 fL (ref 80.0–100.0)
Platelets: 128 10*3/uL — ABNORMAL LOW (ref 150–400)
RBC: 5.31 MIL/uL (ref 4.22–5.81)
RDW: 15.2 % (ref 11.5–15.5)
WBC: 3.1 10*3/uL — ABNORMAL LOW (ref 4.0–10.5)
nRBC: 0 % (ref 0.0–0.2)

## 2022-05-18 LAB — RENAL FUNCTION PANEL
Albumin: 3.5 g/dL (ref 3.5–5.0)
Anion gap: 10 (ref 5–15)
BUN: 22 mg/dL (ref 8–23)
CO2: 31 mmol/L (ref 22–32)
Calcium: 9.2 mg/dL (ref 8.9–10.3)
Chloride: 100 mmol/L (ref 98–111)
Creatinine, Ser: 0.55 mg/dL — ABNORMAL LOW (ref 0.61–1.24)
GFR, Estimated: >60 mL/min (ref >60)
Glucose, Bld: 106 mg/dL — ABNORMAL HIGH (ref 70–99)
Phosphorus: 3.5 mg/dL (ref 2.5–4.6)
Potassium: 4.1 mmol/L (ref 3.5–5.1)
Sodium: 141 mmol/L (ref 135–145)

## 2022-05-18 LAB — GLUCOSE, CAPILLARY
Glucose-Capillary: 106 mg/dL — ABNORMAL HIGH (ref 70–99)
Glucose-Capillary: 101 mg/dL — ABNORMAL HIGH (ref 70–99)
Glucose-Capillary: 133 mg/dL — ABNORMAL HIGH (ref 70–99)
Glucose-Capillary: 119 mg/dL — ABNORMAL HIGH (ref 70–99)

## 2022-05-18 LAB — BRAIN NATRIURETIC PEPTIDE: B Natriuretic Peptide: 210.1 pg/mL — ABNORMAL HIGH (ref 0.0–100.0)

## 2022-05-18 LAB — PROCALCITONIN: Procalcitonin: <0.1 ng/mL

## 2022-05-18 MED ORDER — FUROSEMIDE 10 MG/ML IJ SOLN
20.0000 mg | Freq: Once | INTRAMUSCULAR | Status: AC
  Administered 2022-05-18: 20 mg via INTRAVENOUS
  Filled 2022-05-18: qty 2

## 2022-05-18 MED ORDER — METOPROLOL TARTRATE 25 MG PO TABS
12.5000 mg | ORAL_TABLET | Freq: Two times a day (BID) | ORAL | Status: DC
  Administered 2022-05-18 – 2022-05-25 (×12): 12.5 mg via ORAL
  Filled 2022-05-18 (×13): qty 1

## 2022-05-18 NOTE — Progress Notes (Signed)
SUBJECTIVE: Patient is still requiring high dose of oxygen and short of breath that was still in ICU.   Vitals:   05/18/22 1020 05/18/22 1030 05/18/22 1100 05/18/22 1130  BP:  (!) 149/80 133/62 115/65  Pulse: (!) 53  (!) 33 (!) 41  Resp:  (!) '21 12 13  '$ Temp:      TempSrc:      SpO2:  96% 96% 96%  Weight:      Height:        Intake/Output Summary (Last 24 hours) at 05/18/2022 1155 Last data filed at 05/18/2022 0900 Gross per 24 hour  Intake 381.91 ml  Output 1800 ml  Net -1418.09 ml    LABS: Basic Metabolic Panel: Recent Labs    05/16/22 0427 05/17/22 0450 05/18/22 0440  NA 142 142 141  K 4.8 4.5 4.1  CL 108 103 100  CO2 29 33* 31  GLUCOSE 111* 90 106*  BUN 34* 30* 22  CREATININE 0.79 0.84 0.55*  CALCIUM 8.9 9.2 9.2  MG 2.4  --   --   PHOS 2.7 2.4* 3.5   Liver Function Tests: Recent Labs    05/17/22 0450 05/18/22 0440  ALBUMIN 3.6 3.5   No results for input(s): "LIPASE", "AMYLASE" in the last 72 hours. CBC: Recent Labs    05/17/22 0450 05/18/22 0440  WBC 5.3 3.1*  HGB 15.3 15.8  HCT 49.3 49.0  MCV 94.3 92.3  PLT 157 128*   Cardiac Enzymes: No results for input(s): "CKTOTAL", "CKMB", "CKMBINDEX", "TROPONINI" in the last 72 hours. BNP: Invalid input(s): "POCBNP" D-Dimer: No results for input(s): "DDIMER" in the last 72 hours. Hemoglobin A1C: No results for input(s): "HGBA1C" in the last 72 hours. Fasting Lipid Panel: No results for input(s): "CHOL", "HDL", "LDLCALC", "TRIG", "CHOLHDL", "LDLDIRECT" in the last 72 hours. Thyroid Function Tests: No results for input(s): "TSH", "T4TOTAL", "T3FREE", "THYROIDAB" in the last 72 hours.  Invalid input(s): "FREET3" Anemia Panel: No results for input(s): "VITAMINB12", "FOLATE", "FERRITIN", "TIBC", "IRON", "RETICCTPCT" in the last 72 hours.   PHYSICAL EXAM General: Well developed, well nourished, in no acute distress HEENT:  Normocephalic and atramatic Neck:  No JVD.  Lungs: Clear bilaterally to  auscultation and percussion. Heart: HRRR . Normal S1 and S2 without gallops or murmurs.  Abdomen: Bowel sounds are positive, abdomen soft and non-tender  Msk:  Back normal, normal gait. Normal strength and tone for age. Extremities: No clubbing, cyanosis or edema.   Neuro: Alert and oriented X 3. Psych:  Good affect, responds appropriately  TELEMETRY: Sinus bradycardia 58 bpm  ASSESSMENT AND PLAN: Patient started on amiodarone p.o. along with Eliquis and metoprolol.  Patient is in sinus rhythm now.  His respiratory status has markedly improved significantly requiring high dose of oxygen that was still in ICU.  Principal Problem:   Influenza A with pneumonia Active Problems:   Acute metabolic encephalopathy   Acute respiratory failure with hypoxia and hypercapnia (HCC)   COPD exacerbation (HCC)   HTN (hypertension)   Controlled type 2 diabetes mellitus without complication, without long-term current use of insulin (HCC)   Tobacco abuse   Overweight (BMI 25.0-29.9)   Atrial fibrillation with RVR (HCC)   Acute on chronic diastolic CHF (congestive heart failure) (HCC)    Alacia Rehmann A, MD, [facility] 05/18/2022 11:55 AM

## 2022-05-18 NOTE — Progress Notes (Signed)
[facility] Progress Note  Patient: Christopher Mendez    YPP:[id number]  DOA: 05/13/2022    Date of Service: the patient was seen and examined on 05/18/2022  Brief hospital course: 71 year old male with past medical history of ongoing tobacco abuse, COPD and hypertension brought to the emergency room on the early morning hours of 12/21 for cough, fever and progressively worsening shortness of breath times several days.  The emergency room, patient noted to be significantly hypoxic and febrile and positive for influenza A.  Patient also noted to be confused and ABG noted hypercapnia.  Patient admitted to the hospitalist service and critical care consulted.  Patient placed on BiPAP and overnight, mentation and hypercapnia improved.  However, by afternoon of 12/22, patient more agitated and combative and started on Precedex.  BiPAP placed to prevent hypercapnia.  By 12/23, patient's mentation and breathing improved and able to be taken off of BiPAP on high flow oxygen.  Patient also went into new onset rapid atrial fibrillation and cardiology consulted.  Heart rate better controlled and patient has gone back into normal sinus rhythm.  Still on large amount of oxygen support, currently 40 L high flow.  Continues to have issues with intermittent confusion   Assessment and Plan: * Influenza A with pneumonia Patient presents for evaluation of altered mental status and weakness and his influenza A PCR is positive.  Procalcitonin essentially normal.  Will discontinue antibiotics.  CT scan of chest has atelectasis versus infiltrate  Acute respiratory failure with hypoxia and hypercapnia (HCC) Improved on BiPAP and currently on high flow oxygen, 40 L.  Has not had much improvement of some of this may be secondary to some volume overload now secondary to A-fib.  Continue nightly BiPAP.  Atrial fibrillation with RVR ([place]) New onset.  Cardiology consulted.  Echocardiogram notes bilateral severe atrial  dilatation.  Titrate down metoprolol for mild bradycardia.  Started on anticoagulation  Acute metabolic encephalopathy Initially quite lethargic.  At baseline, awake alert and oriented.  Initially somewhat obtunded.  Following BiPAP, much more awake, however by early afternoon, agitated requiring Precedex.  Confusion is intermittent, likely due to hypercarbia now.    Improved during the day.  COPD exacerbation (HCC) Acute exacerbation secondary to influenza A infection Continue bronchodilator therapy and inhaled steroids  HTN (hypertension) Hold oral antihypertensive agents Place patient on as needed hydralazine for systolic blood pressure greater than 160  Controlled type 2 diabetes mellitus without complication, without long-term current use of insulin (HCC) Check blood sugars every 4 hours while patient is n.p.o. CBGs stable, even despite steroids  Tobacco abuse Nicotine patch  Overweight (BMI 25.0-29.9) Meets criteria with BMI greater than 25  Acute on chronic diastolic CHF (congestive heart failure) (HCC) Echocardiogram notes grade 3 diastolic dysfunction.  Will start diuresis once patient is calm.   Pressure ulcer: Stage II on sacrum, present on admission     Body mass index is 25.92 kg/m.    Pressure Injury 05/14/22 Sacrum Mid Deep Tissue Pressure Injury - Purple or maroon localized area of discolored intact skin or blood-filled blister due to damage of underlying soft tissue from pressure and/or shear. (Active)  05/14/22 2030  Location: Sacrum  Location Orientation: Mid  Staging: Deep Tissue Pressure Injury - Purple or maroon localized area of discolored intact skin or blood-filled blister due to damage of underlying soft tissue from pressure and/or shear.  Wound Description (Comments):   Present on Admission: Yes  Dressing Type Foam - Lift dressing to assess  site every shift 05/18/22 0800     Consultants: Critical care Cardiology  Procedures: Echocardiogram  notes biatrial severe enlargement with grade 3 diastolic dysfunction  Antimicrobials: IV Rocephin and Zithromax 12/21 - 12/22  Code Status: Full code   Subjective: More lucid now, mild shortness of breath  Objective: Noted mild bradycardia Vitals:   05/18/22 1530 05/18/22 1600  BP: 133/77 (!) 140/70  Pulse:  (!) 55  Resp: 19 18  Temp:    SpO2: 95% 93%    Intake/Output Summary (Last 24 hours) at 05/18/2022 1653 Last data filed at 05/18/2022 1500 Gross per 24 hour  Intake 303.86 ml  Output 2600 ml  Net -2296.14 ml    Filed Weights   05/14/22 0012 05/14/22 2037 05/14/22 2100  Weight: 92.2 kg 86.7 kg 86.7 kg   Body mass index is 25.92 kg/m.  Exam:  General: Oriented x 2, fatigued HEENT: Normocephalic, atraumatic, mucous membranes slightly dry Cardiovascular: Irregular rhythm, mild bradycardia Respiratory: Bilateral end expiratory wheeze Abdomen: Soft, nontender, nondistended, hypoactive bowel sounds Musculoskeletal: No clubbing or cyanosis, trace pitting edema Skin: No skin breaks, tears or lesions Psychiatry: Appropriate, no evidence of psychoses Neurology: No focal deficits  Data Reviewed: Normal procalcitonin, BNP of 210.  White blood cell count 3.1.  Disposition:  Status is: Inpatient Remains inpatient appropriate because:  -Improvement in oxygen -Improvement in mentation    Anticipated discharge date: 12/28 Family Communication: Daughter at bedside DVT Prophylaxis:  apixaban (ELIQUIS) tablet 5 mg   35 minutes in the care of this critically ill patient including medical decision making, discussion of care with consultants, evaluation lab work and physical examination at the bedside Author: Annita Brod ,MD 05/18/2022 4:53 PM  To reach On-call, see care teams to locate the attending and reach out via [email or web address]. Between 7PM-7AM, please contact night-coverage If you still have difficulty reaching the attending provider, please page the [facility]  (Director on Call) for [facility] on amion for assistance.

## 2022-05-19 ENCOUNTER — Other Ambulatory Visit (HOSPITAL_COMMUNITY): Payer: Self-pay

## 2022-05-19 DIAGNOSIS — I4891 Unspecified atrial fibrillation: Secondary | ICD-10-CM | POA: Diagnosis not present

## 2022-05-19 DIAGNOSIS — J09X1 Influenza due to identified novel influenza A virus with pneumonia: Secondary | ICD-10-CM | POA: Diagnosis not present

## 2022-05-19 DIAGNOSIS — J441 Chronic obstructive pulmonary disease with (acute) exacerbation: Secondary | ICD-10-CM | POA: Diagnosis not present

## 2022-05-19 DIAGNOSIS — J9601 Acute respiratory failure with hypoxia: Secondary | ICD-10-CM | POA: Diagnosis not present

## 2022-05-19 LAB — BASIC METABOLIC PANEL
Anion gap: 11 (ref 5–15)
BUN: 26 mg/dL — ABNORMAL HIGH (ref 8–23)
CO2: 30 mmol/L (ref 22–32)
Calcium: 9.5 mg/dL (ref 8.9–10.3)
Chloride: 98 mmol/L (ref 98–111)
Creatinine, Ser: 0.65 mg/dL (ref 0.61–1.24)
GFR, Estimated: >60 mL/min (ref >60)
Glucose, Bld: 107 mg/dL — ABNORMAL HIGH (ref 70–99)
Potassium: 4.2 mmol/L (ref 3.5–5.1)
Sodium: 139 mmol/L (ref 135–145)

## 2022-05-19 LAB — CULTURE, BLOOD (ROUTINE X 2)
Culture: NO GROWTH
Culture: NO GROWTH
Special Requests: ADEQUATE

## 2022-05-19 LAB — BRAIN NATRIURETIC PEPTIDE: B Natriuretic Peptide: 78.2 pg/mL (ref 0.0–100.0)

## 2022-05-19 LAB — GLUCOSE, CAPILLARY
Glucose-Capillary: 146 mg/dL — ABNORMAL HIGH (ref 70–99)
Glucose-Capillary: 134 mg/dL — ABNORMAL HIGH (ref 70–99)
Glucose-Capillary: 136 mg/dL — ABNORMAL HIGH (ref 70–99)

## 2022-05-19 NOTE — Progress Notes (Signed)
I will better and trending downward for a burst [place] worsening his morning [facility] Progress Note  Patient: Christopher Mendez    DTO:[id number]  DOA: 05/13/2022    Date of Service: the patient was seen and examined on 05/19/2022  Brief hospital course: 71 year old male with past medical history of ongoing tobacco abuse, COPD and hypertension brought to the emergency room on the early morning hours of 12/21 for cough, fever and progressively worsening shortness of breath times several days.  The emergency room, patient noted to be significantly hypoxic and febrile and positive for influenza A.  Patient also noted to be confused and ABG noted hypercapnia.  Patient admitted to the hospitalist service and critical care consulted.  Patient placed on BiPAP and overnight, mentation and hypercapnia improved.  However, by afternoon of 12/22, patient more agitated and combative and started on Precedex.  BiPAP placed to prevent hypercapnia.  By 12/23, patient's mentation and breathing improved and able to be taken off of BiPAP on high flow oxygen.  Patient also went into new onset rapid atrial fibrillation and cardiology consulted.  Heart rate better controlled and patient has gone back into normal sinus rhythm.  Oxygenation slowly improving, down from 40 L to 10 L high flow.  Continues to have issues with intermittent confusion/sundowning requiring Precedex drip at night.  Patient being evaluated for possible LTAC.   Assessment and Plan: * Influenza A with pneumonia Patient presents for evaluation of altered mental status and weakness and his influenza A PCR is positive.  Procalcitonin essentially normal.  Antibiotics discontinued.  CT scan of chest has atelectasis versus infiltrate  Acute respiratory failure with hypoxia and hypercapnia (HCC) Improved on BiPAP and able to titrate down oxygen, currently on 10 L.  Has not had much improvement of some of this may be secondary to some volume overload  now secondary to A-fib.  Continue nightly BiPAP.    Atrial fibrillation with RVR ([place]) New onset.  Cardiology consulted.  Echocardiogram notes bilateral severe atrial dilatation.  Adjusted metoprolol for mild bradycardia and this has since improved.  Started on anticoagulation  Acute metabolic encephalopathy Initially quite lethargic.  At baseline, awake alert and oriented.  Initially somewhat obtunded.  Following BiPAP, much more awake, however by early afternoon, agitated requiring Precedex.  Confusion is intermittent, in part from sundowning, in part from hypercarbia  Improved during the day.  COPD exacerbation (HCC) Acute exacerbation secondary to influenza A infection Continue bronchodilator therapy and inhaled steroids  HTN (hypertension) Hold oral antihypertensive agents Place patient on as needed hydralazine for systolic blood pressure greater than 160  Controlled type 2 diabetes mellitus without complication, without long-term current use of insulin (HCC) Check blood sugars every 4 hours while patient is n.p.o. CBGs stable, even despite steroids  Tobacco abuse Nicotine patch  Overweight (BMI 25.0-29.9) Meets criteria with BMI greater than 25  Acute on chronic diastolic CHF (congestive heart failure) (HCC) Echocardiogram notes grade 3 diastolic dysfunction.  Patient has responded well and has diuresed over 8 L and is more than -4 L deficient.  BNP on 12/26 within normal limits   Pressure ulcer: Stage II on sacrum, present on admission     Body mass index is 25.92 kg/m.    Pressure Injury 05/14/22 Sacrum Mid Deep Tissue Pressure Injury - Purple or maroon localized area of discolored intact skin or blood-filled blister due to damage of underlying soft tissue from pressure and/or shear. (Active)  05/14/22 2030  Location: Sacrum  Location Orientation:  Mid  Staging: Deep Tissue Pressure Injury - Purple or maroon localized area of discolored intact skin or blood-filled  blister due to damage of underlying soft tissue from pressure and/or shear.  Wound Description (Comments):   Present on Admission: Yes  Dressing Type Foam - Lift dressing to assess site every shift 05/19/22 1300     Consultants: Critical care Cardiology  Procedures: Echocardiogram notes biatrial severe enlargement with grade 3 diastolic dysfunction  Antimicrobials: IV Rocephin and Zithromax 12/21 - 12/22  Code Status: Full code   Subjective: Breathing a little easier  Objective:  Vitals:   05/19/22 1230 05/19/22 1300  BP: 132/80 127/66  Pulse: 77 64  Resp: (!) 31 (!) 28  Temp:    SpO2: 93% 93%    Intake/Output Summary (Last 24 hours) at 05/19/2022 1417 Last data filed at 05/19/2022 7915 Gross per 24 hour  Intake 188.5 ml  Output 1150 ml  Net -961.5 ml   Filed Weights   05/14/22 0012 05/14/22 2037 05/14/22 2100  Weight: 92.2 kg 86.7 kg 86.7 kg   Body mass index is 25.92 kg/m.  Exam:  General: Oriented x 2, fatigued HEENT: Normocephalic, atraumatic, mucous membranes slightly dry Cardiovascular: Irregular rhythm, rate controlled Respiratory: Minimal wheezing, otherwise clear to auscultation bilaterally Abdomen: Soft, nontender, nondistended, hypoactive bowel sounds Musculoskeletal: No clubbing or cyanosis, trace pitting edema Skin: No skin breaks, tears or lesions Psychiatry: Appropriate, no evidence of psychoses Neurology: No focal deficits  Data Reviewed: Normal procalcitonin, BNP of 210.  White blood cell count 3.1.  Disposition:  Status is: Inpatient Remains inpatient appropriate because:  -Improvement in oxygen -Improvement in mentation -Evaluation for possible LTAC    Anticipated discharge date: Dependent on if patient is approved for LTAC Family Communication: Will call family DVT Prophylaxis:  apixaban (ELIQUIS) tablet 5 mg    Author: Annita Brod ,MD 05/19/2022 2:17 PM  To reach On-call, see care teams to locate the attending  and reach out via [email or web address]. Between 7PM-7AM, please contact night-coverage If you still have difficulty reaching the attending provider, please page the [facility] (Director on Call) for [facility] on amion for assistance.

## 2022-05-19 NOTE — Progress Notes (Signed)
SUBJECTIVE: Feeling much better   Vitals:   05/19/22 0600 05/19/22 0630 05/19/22 0700 05/19/22 0727  BP: (!) 113/59 (!) 145/83 128/74   Pulse: 64 (!) 32 66   Resp: (!) 25 (!) 26 19   Temp:      TempSrc:      SpO2: 96% (!) 87% 93% 92%  Weight:      Height:        Intake/Output Summary (Last 24 hours) at 05/19/2022 0803 Last data filed at 05/19/2022 0160 Gross per 24 hour  Intake 423.46 ml  Output 2850 ml  Net -2426.54 ml    LABS: Basic Metabolic Panel: Recent Labs    05/17/22 0450 05/18/22 0440 05/19/22 0609  NA 142 141 139  K 4.5 4.1 4.2  CL 103 100 98  CO2 33* 31 30  GLUCOSE 90 106* 107*  BUN 30* 22 26*  CREATININE 0.84 0.55* 0.65  CALCIUM 9.2 9.2 9.5  PHOS 2.4* 3.5  --    Liver Function Tests: Recent Labs    05/17/22 0450 05/18/22 0440  ALBUMIN 3.6 3.5   No results for input(s): "LIPASE", "AMYLASE" in the last 72 hours. CBC: Recent Labs    05/17/22 0450 05/18/22 0440  WBC 5.3 3.1*  HGB 15.3 15.8  HCT 49.3 49.0  MCV 94.3 92.3  PLT 157 128*   Cardiac Enzymes: No results for input(s): "CKTOTAL", "CKMB", "CKMBINDEX", "TROPONINI" in the last 72 hours. BNP: Invalid input(s): "POCBNP" D-Dimer: No results for input(s): "DDIMER" in the last 72 hours. Hemoglobin A1C: No results for input(s): "HGBA1C" in the last 72 hours. Fasting Lipid Panel: No results for input(s): "CHOL", "HDL", "LDLCALC", "TRIG", "CHOLHDL", "LDLDIRECT" in the last 72 hours. Thyroid Function Tests: No results for input(s): "TSH", "T4TOTAL", "T3FREE", "THYROIDAB" in the last 72 hours.  Invalid input(s): "FREET3" Anemia Panel: No results for input(s): "VITAMINB12", "FOLATE", "FERRITIN", "TIBC", "IRON", "RETICCTPCT" in the last 72 hours.   PHYSICAL EXAM General: Well developed, well nourished, in no acute distress HEENT:  Normocephalic and atramatic Neck:  No JVD.  Lungs: Clear bilaterally to auscultation and percussion. Heart: HRRR . Normal S1 and S2 without gallops or  murmurs.  Abdomen: Bowel sounds are positive, abdomen soft and non-tender  Msk:  Back normal, normal gait. Normal strength and tone for age. Extremities: No clubbing, cyanosis or edema.   Neuro: Alert and oriented X 3. Psych:  Good affect, responds appropriately  TELEMETRY:NSR, APCS  ASSESSMENT AND PLAN: Paroxysmal afib, now on NSR, on po amio/metoprolol. In ICU due to COPD/Hypoxia on high dose O2.  Principal Problem:   Influenza A with pneumonia Active Problems:   Acute metabolic encephalopathy   Acute respiratory failure with hypoxia and hypercapnia (HCC)   COPD exacerbation (HCC)   HTN (hypertension)   Controlled type 2 diabetes mellitus without complication, without long-term current use of insulin (HCC)   Tobacco abuse   Overweight (BMI 25.0-29.9)   Atrial fibrillation with RVR (HCC)   Acute on chronic diastolic CHF (congestive heart failure) ([place])    Christopher Mendez A, MD, [facility] 05/19/2022 8:03 AM

## 2022-05-19 NOTE — Evaluation (Signed)
Physical Therapy Evaluation Patient Details Name: Christopher Mendez MRN: [id number] DOB: [birth year] Today's Date: 05/19/2022  History of Present Illness  71 y/o male presented to ED on 05/14/22 for respiratory distress with hypoxia and fall. Found to have influenza A with pneumonia and acute metabolic encephalopathy. PMH: COPD, HTN  Clinical Impression  Patient admitted with the above. PTA, patient lives with wife and reports independence with occasional use of RW and 1 fall in past 6 months. Oriented to self and place this session with no family present to determine baseline cognition. Patient presents with weakness, impaired balance, decreased activity tolerance, and impaired cognition. On 10L HFNC during session, spO2 dropped to 85% during mobility. Required minA for sit to stand transfer with RW with initial posterior lean. Able to take sidesteps towards [facility] with minA for balance. Fearful of falling throughout session but eager to get stronger. Patient will benefit from skilled PT services during acute stay to address listed deficits. Recommend SNF at this time to maximize functional independence, however he may be able to progress to HHPT with 24 hour supervision if family is capable of providing this level of care.        Recommendations for follow up therapy are one component of a multi-disciplinary discharge planning process, led by the attending physician.  Recommendations may be updated based on patient status, additional functional criteria and insurance authorization.  Follow Up Recommendations Skilled nursing-short term rehab (<3 hours/day) Can patient physically be transported by private vehicle: Yes    Assistance Recommended at Discharge Frequent or constant Supervision/Assistance  Patient can return home with the following  A little help with walking and/or transfers;A little help with bathing/dressing/bathroom;Assistance with cooking/housework;Help with stairs or ramp for  entrance;Assist for transportation;Direct supervision/assist for financial management;Direct supervision/assist for medications management    Equipment Recommendations Rolling Kimball Appleby (2 wheels)  Recommendations for Other Services  OT consult    Functional Status Assessment Patient has had a recent decline in their functional status and demonstrates the ability to make significant improvements in function in a reasonable and predictable amount of time.     Precautions / Restrictions Precautions Precautions: Fall Precaution Comments: watch O2 Restrictions Weight Bearing Restrictions: No      Mobility  Bed Mobility Overal bed mobility: Needs Assistance Bed Mobility: Supine to Sit, Sit to Supine     Supine to sit: Supervision Sit to supine: Supervision   General bed mobility comments: supervision for safety. Increased time to complete    Transfers Overall transfer level: Needs assistance Equipment used: Rolling Mylisa Brunson (2 wheels) Transfers: Sit to/from Stand, Bed to chair/wheelchair/BSC Sit to Stand: Min assist   Step pivot transfers: Min assist       General transfer comment: initially with posterior lean in standing and required minA to steady and anterior weight shift. Assist to take lateral steps with 2/4 DOE and spO2 85% on 10L HFNC    Ambulation/Gait                  Stairs            Wheelchair Mobility    Modified Rankin (Stroke Patients Only)       Balance Overall balance assessment: Needs assistance Sitting-balance support: No upper extremity supported, Feet supported Sitting balance-Leahy Scale: Fair     Standing balance support: Bilateral upper extremity supported, Reliant on assistive device for balance Standing balance-Leahy Scale: Poor  Pertinent Vitals/Pain Pain Assessment Pain Assessment: No/denies pain    Home Living Family/patient expects to be discharged to:: Private  residence Living Arrangements: Spouse/significant other Available Help at Discharge: Family Type of Home: House Home Access: Stairs to enter   Technical brewer of Steps: 1   Home Layout: One level Home Equipment: Conservation officer, nature (2 wheels)      Prior Function Prior Level of Function : Independent/Modified Independent;History of Falls (last six months)             Mobility Comments: occasional use of RW. Reports 1 fall in past 6 months       Hand Dominance        Extremity/Trunk Assessment   Upper Extremity Assessment Upper Extremity Assessment: Generalized weakness    Lower Extremity Assessment Lower Extremity Assessment: Generalized weakness    Cervical / Trunk Assessment Cervical / Trunk Assessment: Kyphotic  Communication   Communication: No difficulties  Cognition Arousal/Alertness: Awake/alert Behavior During Therapy: WFL for tasks assessed/performed Overall Cognitive Status: No family/caregiver present to determine baseline cognitive functioning Area of Impairment: Orientation, Attention, Memory, Following commands, Safety/judgement, Awareness, Problem solving                 Orientation Level: Disoriented to, Time, Situation (increased time to identify place) Current Attention Level: Sustained Memory: Decreased short-term memory, Decreased recall of precautions Following Commands: Follows one step commands with increased time Safety/Judgement: Decreased awareness of safety Awareness: Emergent Problem Solving: Slow processing, Difficulty sequencing, Requires verbal cues General Comments: increased time to identify place. Slow processing noted with verbal cues throughout. No family present throughout session        General Comments General comments (skin integrity, edema, etc.): On 10 L HFNC, spO2 drop to 85% during mobility and increase to 93% with seated rest break    Exercises     Assessment/Plan    PT Assessment Patient needs  continued PT services  PT Problem List Decreased strength;Decreased activity tolerance;Decreased balance;Decreased mobility;Decreased coordination;Decreased cognition;Decreased knowledge of precautions;Decreased knowledge of use of DME;Decreased safety awareness;Cardiopulmonary status limiting activity       PT Treatment Interventions DME instruction;Stair training;Gait training;Functional mobility training;Therapeutic activities;Therapeutic exercise;Balance training;Patient/family education    PT Goals (Current goals can be found in the Care Plan section)  Acute Rehab PT Goals Patient Stated Goal: to get stronger PT Goal Formulation: With patient Time For Goal Achievement: 06/02/22 Potential to Achieve Goals: Good    Frequency Min 2X/week     Co-evaluation               AM-PAC PT "6 Clicks" Mobility  Outcome Measure Help needed turning from your back to your side while in a flat bed without using bedrails?: A Little Help needed moving from lying on your back to sitting on the side of a flat bed without using bedrails?: A Little Help needed moving to and from a bed to a chair (including a wheelchair)?: A Little Help needed standing up from a chair using your arms (e.g., wheelchair or bedside chair)?: A Lot Help needed to walk in hospital room?: Total   6 Click Score: 12    End of Session Equipment Utilized During Treatment: Oxygen;Gait belt Activity Tolerance: Patient tolerated treatment well Patient left: in bed;with call bell/phone within reach;with bed alarm set Nurse Communication: Mobility status PT Visit Diagnosis: Unsteadiness on feet (R26.81);Muscle weakness (generalized) (M62.81);History of falling (Z91.81);Difficulty in walking, not elsewhere classified (R26.2)    Time: 8588-5027 PT Time Calculation (min) (ACUTE ONLY): 26  min   Charges:   PT Evaluation $PT Eval Moderate Complexity: 1 Mod PT Treatments $Therapeutic Activity: 8-22 mins        Pamelyn Bancroft A.  Gilford Rile PT, DPT [facility] - Acute Rehabilitation Services   Itali Mckendry A Zaydyn Havey 05/19/2022, 12:51 PM

## 2022-05-20 DIAGNOSIS — J9601 Acute respiratory failure with hypoxia: Secondary | ICD-10-CM | POA: Diagnosis not present

## 2022-05-20 DIAGNOSIS — E663 Overweight: Secondary | ICD-10-CM | POA: Diagnosis not present

## 2022-05-20 DIAGNOSIS — J09X1 Influenza due to identified novel influenza A virus with pneumonia: Secondary | ICD-10-CM | POA: Diagnosis not present

## 2022-05-20 DIAGNOSIS — G9341 Metabolic encephalopathy: Secondary | ICD-10-CM | POA: Diagnosis not present

## 2022-05-20 LAB — BASIC METABOLIC PANEL
Anion gap: 10 (ref 5–15)
BUN: 32 mg/dL — ABNORMAL HIGH (ref 8–23)
CO2: 30 mmol/L (ref 22–32)
Calcium: 9.4 mg/dL (ref 8.9–10.3)
Chloride: 98 mmol/L (ref 98–111)
Creatinine, Ser: 0.9 mg/dL (ref 0.61–1.24)
GFR, Estimated: >60 mL/min (ref >60)
Glucose, Bld: 107 mg/dL — ABNORMAL HIGH (ref 70–99)
Potassium: 4.5 mmol/L (ref 3.5–5.1)
Sodium: 138 mmol/L (ref 135–145)

## 2022-05-20 LAB — GLUCOSE, CAPILLARY
Glucose-Capillary: 113 mg/dL — ABNORMAL HIGH (ref 70–99)
Glucose-Capillary: 116 mg/dL — ABNORMAL HIGH (ref 70–99)
Glucose-Capillary: 108 mg/dL — ABNORMAL HIGH (ref 70–99)
Glucose-Capillary: 103 mg/dL — ABNORMAL HIGH (ref 70–99)
Glucose-Capillary: 123 mg/dL — ABNORMAL HIGH (ref 70–99)

## 2022-05-20 MED ORDER — IPRATROPIUM-ALBUTEROL 0.5-2.5 (3) MG/3ML IN SOLN
3.0000 mL | Freq: Three times a day (TID) | RESPIRATORY_TRACT | Status: DC
  Administered 2022-05-21 – 2022-05-23 (×6): 3 mL via RESPIRATORY_TRACT
  Filled 2022-05-20 (×7): qty 3

## 2022-05-20 NOTE — Progress Notes (Signed)
OT Cancellation Note  Patient Details Name: Christopher Mendez MRN: [id number] DOB: December 06, 1950   Cancelled Treatment:    Reason Eval/Treat Not Completed: Patient declined, no reason specified. OT orders received, chart reviewed. Pt reported he just started eating lunch and his daughter is on the way. Pt requesting OT come back later this afternoon. Will re-attempt as able.  Doneta Public 05/20/2022, 11:51 AM

## 2022-05-20 NOTE — Progress Notes (Signed)
SUBJECTIVE: Patient is a 71 year old man with past medical history significant for tobacco abuse, COPD, HTN. Patient presented to ED on 05/14/22 with complaints of cough, fever, worsening SOB for several days. Patient tested positive for infuenza A. Patient developed new onset atrial fibrillation on 05/16/22.    Vitals:   05/20/22 0400 05/20/22 0500 05/20/22 0530 05/20/22 0600  BP: (!) 91/52 90/77 101/71 (!) 92/58  Pulse: (!) 29 (!) 142 64 78  Resp: 15 (!) 22 (!) 26 18  Temp: 98.4 F (36.9 C)     TempSrc: Oral     SpO2: 98% (!) 89% 93% 90%  Weight:      Height:        Intake/Output Summary (Last 24 hours) at 05/20/2022 1140 Last data filed at 05/20/2022 0645 Gross per 24 hour  Intake 294.33 ml  Output 1375 ml  Net -1080.67 ml    LABS: Basic Metabolic Panel: Recent Labs    05/18/22 0440 05/19/22 0609 05/20/22 0445  NA 141 139 138  K 4.1 4.2 4.5  CL 100 98 98  CO2 '31 30 30  '$ GLUCOSE 106* 107* 107*  BUN 22 26* 32*  CREATININE 0.55* 0.65 0.90  CALCIUM 9.2 9.5 9.4  PHOS 3.5  --   --    Liver Function Tests: Recent Labs    05/18/22 0440  ALBUMIN 3.5   No results for input(s): "LIPASE", "AMYLASE" in the last 72 hours. CBC: Recent Labs    05/18/22 0440  WBC 3.1*  HGB 15.8  HCT 49.0  MCV 92.3  PLT 128*   Cardiac Enzymes: No results for input(s): "CKTOTAL", "CKMB", "CKMBINDEX", "TROPONINI" in the last 72 hours. BNP: Invalid input(s): "POCBNP" D-Dimer: No results for input(s): "DDIMER" in the last 72 hours. Hemoglobin A1C: No results for input(s): "HGBA1C" in the last 72 hours. Fasting Lipid Panel: No results for input(s): "CHOL", "HDL", "LDLCALC", "TRIG", "CHOLHDL", "LDLDIRECT" in the last 72 hours. Thyroid Function Tests: No results for input(s): "TSH", "T4TOTAL", "T3FREE", "THYROIDAB" in the last 72 hours.  Invalid input(s): "FREET3" Anemia Panel: No results for input(s): "VITAMINB12", "FOLATE", "FERRITIN", "TIBC", "IRON", "RETICCTPCT" in the last 72  hours.   PHYSICAL EXAM General: Well developed, well nourished, in no acute distress HEENT:  Normocephalic and atramatic Neck:  No JVD.  Lungs: Clear bilaterally to auscultation and percussion. Heart: HRRR . Normal S1 and S2 without gallops or murmurs.  Abdomen: Bowel sounds are positive, abdomen soft and non-tender  Msk:  Back normal, normal gait. Normal strength and tone for age. Extremities: No clubbing, cyanosis or edema.   Neuro: Alert and oriented X 3. Psych:  Good affect, responds appropriately  TELEMETRY: sinus rhythm, HR 59 bpm  ASSESSMENT AND PLAN: Patient resting comfortably in bed. Denies chest pain. Reports shortness of breath improved after breathing treatment. Patient currently in NSR on po amiodarone and metoprolol. Will continue to follow.   Principal Problem:   Influenza A with pneumonia Active Problems:   Acute metabolic encephalopathy   Acute respiratory failure with hypoxia and hypercapnia (HCC)   COPD exacerbation (HCC)   HTN (hypertension)   Controlled type 2 diabetes mellitus without complication, without long-term current use of insulin (HCC)   Tobacco abuse   Overweight (BMI 25.0-29.9)   Atrial fibrillation with RVR (HCC)   Acute on chronic diastolic CHF (congestive heart failure) ([place])    Demitris Pokorny, FNP-C 05/20/2022 11:40 AM

## 2022-05-20 NOTE — Progress Notes (Incomplete)
[facility] Cardiology    SUBJECTIVE: ***   Vitals:   05/20/22 0400 05/20/22 0500 05/20/22 0530 05/20/22 0600  BP: (!) 91/52 90/77 101/71 (!) 92/58  Pulse: (!) 29 (!) 142 64 78  Resp: 15 (!) 22 (!) 26 18  Temp: 98.4 F (36.9 C)     TempSrc: Oral     SpO2: 98% (!) 89% 93% 90%  Weight:      Height:         Intake/Output Summary (Last 24 hours) at 05/20/2022 1021 Last data filed at 05/20/2022 0645 Gross per 24 hour  Intake 294.33 ml  Output 1375 ml  Net -1080.67 ml      PHYSICAL EXAM  General: Well developed, well nourished, in no acute distress HEENT:  Normocephalic and atramatic Neck:  No JVD.  Lungs: Clear bilaterally to auscultation and percussion. Heart: HRRR . Normal S1 and S2 without gallops or murmurs.  Abdomen: Bowel sounds are positive, abdomen soft and non-tender  Msk:  Back normal, normal gait. Normal strength and tone for age. Extremities: No clubbing, cyanosis or edema.   Neuro: Alert and oriented X 3. Psych:  Good affect, responds appropriately   LABS: Basic Metabolic Panel: Recent Labs    05/18/22 0440 05/19/22 0609 05/20/22 0445  NA 141 139 138  K 4.1 4.2 4.5  CL 100 98 98  CO2 '31 30 30  '$ GLUCOSE 106* 107* 107*  BUN 22 26* 32*  CREATININE 0.55* 0.65 0.90  CALCIUM 9.2 9.5 9.4  PHOS 3.5  --   --    Liver Function Tests: Recent Labs    05/18/22 0440  ALBUMIN 3.5   No results for input(s): "LIPASE", "AMYLASE" in the last 72 hours. CBC: Recent Labs    05/18/22 0440  WBC 3.1*  HGB 15.8  HCT 49.0  MCV 92.3  PLT 128*   Cardiac Enzymes: No results for input(s): "CKTOTAL", "CKMB", "CKMBINDEX", "TROPONINI" in the last 72 hours. BNP: Invalid input(s): "POCBNP" D-Dimer: No results for input(s): "DDIMER" in the last 72 hours. Hemoglobin A1C: No results for input(s): "HGBA1C" in the last 72 hours. Fasting Lipid Panel: No results for input(s): "CHOL", "HDL", "LDLCALC", "TRIG", "CHOLHDL", "LDLDIRECT" in the last 72 hours. Thyroid Function  Tests: No results for input(s): "TSH", "T4TOTAL", "T3FREE", "THYROIDAB" in the last 72 hours.  Invalid input(s): "FREET3" Anemia Panel: No results for input(s): "VITAMINB12", "FOLATE", "FERRITIN", "TIBC", "IRON", "RETICCTPCT" in the last 72 hours.  No results found.   Echo ***  TELEMETRY: ***:  ASSESSMENT AND PLAN:  Principal Problem:   Influenza A with pneumonia Active Problems:   Acute metabolic encephalopathy   Acute respiratory failure with hypoxia and hypercapnia (HCC)   COPD exacerbation (HCC)   HTN (hypertension)   Controlled type 2 diabetes mellitus without complication, without long-term current use of insulin (HCC)   Tobacco abuse   Overweight (BMI 25.0-29.9)   Atrial fibrillation with RVR (HCC)   Acute on chronic diastolic CHF (congestive heart failure) ([place])    1. ***   Yolonda Kida, MD, PHD [facility] 05/20/2022 10:21 AM

## 2022-05-20 NOTE — Progress Notes (Signed)
I will better and trending downward for a burst [place] worsening his morning [facility] Progress Note  Patient: Christopher Mendez    CBJ:[id number]  DOA: 05/13/2022    Date of Service: the patient was seen and examined on 05/20/2022  Brief hospital course: 71 year old male with past medical history of ongoing tobacco abuse, COPD and hypertension brought to the emergency room on the early morning hours of 12/21 for cough, fever and progressively worsening shortness of breath times several days.  The emergency room, patient noted to be significantly hypoxic and febrile and positive for influenza A.  Patient also noted to be confused and ABG noted hypercapnia.  Patient admitted to the hospitalist service and critical care consulted.  Patient placed on BiPAP and overnight, mentation and hypercapnia improved.  However, by afternoon of 12/22, patient more agitated and combative and started on Precedex.  BiPAP placed to prevent hypercapnia.  By 12/23, patient's mentation and breathing improved and able to be taken off of BiPAP on high flow oxygen.  Patient also went into new onset rapid atrial fibrillation and cardiology consulted.  Heart rate better controlled and patient has gone back into normal sinus rhythm.  Oxygenation slowly improving, down from 40 L to 10 L high flow.  Continues to have issues with intermittent confusion/sundowning requiring Precedex drip at night.  Patient being evaluated for possible LTAC.   Assessment and Plan: * Influenza A with pneumonia Patient presents for evaluation of altered mental status and weakness and his influenza A PCR is positive.  Procalcitonin essentially normal.  Antibiotics discontinued.  CT scan of chest has atelectasis versus infiltrate  Acute respiratory failure with hypoxia and hypercapnia (HCC) Improved on BiPAP and able to titrate down oxygen, currently on 10 L.  Likely will be at this level of oxygenation for some time.  Has not had much improvement  of some of this may be secondary to some volume overload now secondary to A-fib.  Continue nightly BiPAP.    Atrial fibrillation with RVR ([place]) New onset.  Cardiology consulted.  Echocardiogram notes bilateral severe atrial dilatation.  Adjusted metoprolol for mild bradycardia and this has since improved.  Started on anticoagulation  Acute metabolic encephalopathy Initially quite lethargic.  At baseline, awake alert and oriented.  Initially somewhat obtunded.  Following BiPAP, much more awake, however by early afternoon, agitated requiring Precedex.  Confusion is intermittent, in part from sundowning, in part from hypercarbia  Improved during the day.  COPD exacerbation (HCC) Acute exacerbation secondary to influenza A infection Continue bronchodilator therapy and inhaled steroids  HTN (hypertension) Hold oral antihypertensive agents Place patient on as needed hydralazine for systolic blood pressure greater than 160  Controlled type 2 diabetes mellitus without complication, without long-term current use of insulin (HCC) Check blood sugars every 4 hours while patient is n.p.o. CBGs stable, even despite steroids  Tobacco abuse Nicotine patch  Overweight (BMI 25.0-29.9) Meets criteria with BMI greater than 25  Acute on chronic diastolic CHF (congestive heart failure) (HCC) Echocardiogram notes grade 3 diastolic dysfunction.  Patient has responded well and has diuresed almost 10 L and is more than -6 L deficient.  BNP on 12/26 within normal limits   Pressure ulcer: Stage II on sacrum, present on admission     Body mass index is 25.92 kg/m.    Pressure Injury 05/14/22 Sacrum Mid Deep Tissue Pressure Injury - Purple or maroon localized area of discolored intact skin or blood-filled blister due to damage of underlying soft tissue from pressure  and/or shear. (Active)  05/14/22 2030  Location: Sacrum  Location Orientation: Mid  Staging: Deep Tissue Pressure Injury - Purple or maroon  localized area of discolored intact skin or blood-filled blister due to damage of underlying soft tissue from pressure and/or shear.  Wound Description (Comments):   Present on Admission: Yes  Dressing Type Foam - Lift dressing to assess site every shift 05/20/22 0000     Consultants: Critical care Cardiology  Procedures: Echocardiogram notes biatrial severe enlargement with grade 3 diastolic dysfunction  Antimicrobials: IV Rocephin and Zithromax 12/21 - 12/22  Code Status: Full code   Subjective: Breathing about the same  Objective:  Vitals:   05/20/22 0800 05/20/22 1200  BP:    Pulse:    Resp:    Temp: 98.9 F (37.2 C) 98.7 F (37.1 C)  SpO2:      Intake/Output Summary (Last 24 hours) at 05/20/2022 1443 Last data filed at 05/20/2022 0645 Gross per 24 hour  Intake 294.33 ml  Output 1175 ml  Net -880.67 ml   Filed Weights   05/14/22 0012 05/14/22 2037 05/14/22 2100  Weight: 92.2 kg 86.7 kg 86.7 kg   Body mass index is 25.92 kg/m.  Exam:  General: Oriented x 2, fatigued HEENT: Normocephalic, atraumatic, mucous membranes slightly dry Cardiovascular: Irregular rhythm, rate controlled Respiratory: Minimal wheezing, otherwise clear to auscultation bilaterally Abdomen: Soft, nontender, nondistended, hypoactive bowel sounds Musculoskeletal: No clubbing or cyanosis, trace pitting edema Skin: No skin breaks, tears or lesions Psychiatry: Appropriate, no evidence of psychoses Neurology: No focal deficits  Data Reviewed: Normal basic metabolic panel  Disposition:  Status is: Inpatient Remains inpatient appropriate because:  -Improvement in oxygen -Improvement in mentation -Evaluation for possible LTAC    Anticipated discharge date: Dependent on if patient is approved for LTAC Family Communication: Updated daughter by phone DVT Prophylaxis:  apixaban [name]) tablet 5 mg    Author: Annita Brod ,MD 05/20/2022 2:43 PM  To reach On-call, see  care teams to locate the attending and reach out via [email or web address]. Between 7PM-7AM, please contact night-coverage If you still have difficulty reaching the attending provider, please page the [facility] (Director on Call) for [facility] on amion for assistance.

## 2022-05-20 NOTE — TOC Progression Note (Signed)
Transition of Care [facility]) - Progression Note    Patient Details  Name: Christopher Mendez MRN: [id number] Date of Birth: 11-05-1950  Transition of Care [facility]) CM/SW [place], [place] Phone Number: 05/20/2022, 3:40 PM  Clinical Narrative:     CSW spoke with patient's daughter Christopher Mendez who was interested in [place] facility choices, CSW provided local options she is also interested in [facility] in [place].   She reports she will speak with her mother tonight about options as patient's wife is also in the hospital. She reports she will call CSW back with choice of LTAC facility.         Expected Discharge Plan and Services                                               Social Determinants of Health (SDOH) Interventions SDOH Screenings   Tobacco Use: High Risk (02/26/2021)    Readmission Risk Interventions     No data to display

## 2022-05-21 DIAGNOSIS — I4891 Unspecified atrial fibrillation: Secondary | ICD-10-CM | POA: Diagnosis not present

## 2022-05-21 DIAGNOSIS — J9601 Acute respiratory failure with hypoxia: Secondary | ICD-10-CM | POA: Diagnosis not present

## 2022-05-21 DIAGNOSIS — J09X1 Influenza due to identified novel influenza A virus with pneumonia: Secondary | ICD-10-CM | POA: Diagnosis not present

## 2022-05-21 DIAGNOSIS — G9341 Metabolic encephalopathy: Secondary | ICD-10-CM | POA: Diagnosis not present

## 2022-05-21 LAB — BASIC METABOLIC PANEL
Anion gap: 10 (ref 5–15)
BUN: 48 mg/dL — ABNORMAL HIGH (ref 8–23)
CO2: 30 mmol/L (ref 22–32)
Calcium: 9.3 mg/dL (ref 8.9–10.3)
Chloride: 99 mmol/L (ref 98–111)
Creatinine, Ser: 0.91 mg/dL (ref 0.61–1.24)
GFR, Estimated: >60 mL/min (ref >60)
Glucose, Bld: 95 mg/dL (ref 70–99)
Potassium: 4.3 mmol/L (ref 3.5–5.1)
Sodium: 139 mmol/L (ref 135–145)

## 2022-05-21 LAB — GLUCOSE, CAPILLARY
Glucose-Capillary: 89 mg/dL (ref 70–99)
Glucose-Capillary: 101 mg/dL — ABNORMAL HIGH (ref 70–99)
Glucose-Capillary: 115 mg/dL — ABNORMAL HIGH (ref 70–99)
Glucose-Capillary: 115 mg/dL — ABNORMAL HIGH (ref 70–99)

## 2022-05-21 MED ORDER — FUROSEMIDE 10 MG/ML IJ SOLN
20.0000 mg | Freq: Two times a day (BID) | INTRAMUSCULAR | Status: DC
  Administered 2022-05-21 – 2022-05-22 (×3): 20 mg via INTRAVENOUS
  Filled 2022-05-21 (×3): qty 2

## 2022-05-21 NOTE — Progress Notes (Signed)
I will better and trending downward for a burst [place] worsening his morning [facility] Progress Note  Patient: Christopher Mendez    LKG:[id number]  DOA: 05/13/2022    Date of Service: the patient was seen and examined on 05/21/2022  Brief hospital course: 71 year old male with past medical history of ongoing tobacco abuse, COPD and hypertension brought to the emergency room on the early morning hours of 12/21 for cough, fever and progressively worsening shortness of breath times several days.  The emergency room, patient noted to be significantly hypoxic and febrile and positive for influenza A.  Patient also noted to be confused and ABG noted hypercapnia.  Patient admitted to the hospitalist service and critical care consulted.  Patient placed on BiPAP and overnight, mentation and hypercapnia improved.  However, by afternoon of 12/22, patient more agitated and combative and started on Precedex.  BiPAP placed to prevent hypercapnia.  By 12/23, patient's mentation and breathing improved and able to be taken off of BiPAP on high flow oxygen.  Patient also went into new onset rapid atrial fibrillation and cardiology consulted.  Heart rate better controlled and patient has gone back into normal sinus rhythm.  Oxygenation slowly improving, and in the past few days, patient has gone down from 40 L down to 4 L nasal cannula (regular, not on high flow).  Patient continues to have issues with intermittent confusion/sundowning occasionally Precedex drip at night.  Patient being evaluated for possible LTAC.   Assessment and Plan: * Influenza A with pneumonia Patient presents for evaluation of altered mental status and weakness and his influenza A PCR is positive.  Procalcitonin essentially normal.  Antibiotics discontinued.  CT scan of chest has atelectasis versus infiltrate  Acute respiratory failure with hypoxia and hypercapnia (HCC) Improved on BiPAP and able to titrate down oxygen, currently on 4 L,  non-high flow.  If he is able to sustain and not need Precedex this evening, will move out of unit in the morning and have physical therapy start seeing.  Atrial fibrillation with RVR ([place]) New onset.  Cardiology consulted.  Echocardiogram notes bilateral severe atrial dilatation.  Adjusted metoprolol for mild bradycardia and this has since improved.  Started on anticoagulation  Acute metabolic encephalopathy Initially quite lethargic.  At baseline, awake alert and oriented.  Initially somewhat obtunded.  Following BiPAP, much more awake, however by early afternoon, agitated requiring Precedex.  Confusion is intermittent, in part from sundowning, in part from hypercarbia  Improved during the day.  COPD exacerbation (HCC) Acute exacerbation secondary to influenza A infection Continue bronchodilator therapy and inhaled steroids  HTN (hypertension) Hold oral antihypertensive agents Place patient on as needed hydralazine for systolic blood pressure greater than 160  Controlled type 2 diabetes mellitus without complication, without long-term current use of insulin (HCC) Check blood sugars every 4 hours while patient is n.p.o. CBGs stable, even despite steroids  Tobacco abuse Nicotine patch  Overweight (BMI 25.0-29.9) Meets criteria with BMI greater than 25  Acute on chronic diastolic CHF (congestive heart failure) (HCC) Echocardiogram notes grade 3 diastolic dysfunction.  Patient has responded well and has diuresed almost 11 L and is almost -7 L deficient.  BNP on 12/26 within normal limits   Pressure ulcer: Stage II on sacrum, present on admission     Body mass index is 25.92 kg/m.    Pressure Injury 05/14/22 Sacrum Mid Deep Tissue Pressure Injury - Purple or maroon localized area of discolored intact skin or blood-filled blister due to damage of  underlying soft tissue from pressure and/or shear. (Active)  05/14/22 2030  Location: Sacrum  Location Orientation: Mid  Staging:  Deep Tissue Pressure Injury - Purple or maroon localized area of discolored intact skin or blood-filled blister due to damage of underlying soft tissue from pressure and/or shear.  Wound Description (Comments):   Present on Admission: Yes  Dressing Type Foam - Lift dressing to assess site every shift 05/21/22 1600     Consultants: Critical care Cardiology  Procedures: Echocardiogram notes biatrial severe enlargement with grade 3 diastolic dysfunction  Antimicrobials: IV Rocephin and Zithromax 12/21 - 12/22  Code Status: Full code   Subjective: Patient's breathing continues to improve.  Objective:  Vitals:   05/21/22 1700 05/21/22 1800  BP: 103/64 (!) 124/56  Pulse: 73 74  Resp: (!) 27 (!) 26  Temp:    SpO2: 95% 95%    Intake/Output Summary (Last 24 hours) at 05/21/2022 1937 Last data filed at 05/21/2022 1800 Gross per 24 hour  Intake 767.31 ml  Output 1075 ml  Net -307.69 ml    Filed Weights   05/14/22 0012 05/14/22 2037 05/14/22 2100  Weight: 92.2 kg 86.7 kg 86.7 kg   Body mass index is 25.92 kg/m.  Exam:  General: Oriented x 2, fatigued HEENT: Normocephalic, atraumatic, mucous membranes slightly dry Cardiovascular: Irregular rhythm, rate controlled Respiratory: Clear to auscultation by laterally Abdomen: Soft, nontender, nondistended, hypoactive bowel sounds Musculoskeletal: No clubbing or cyanosis, trace pitting edema Skin: No skin breaks, tears or lesions Psychiatry: Appropriate, no evidence of psychoses Neurology: No focal deficits  Data Reviewed: Stable basic metabolic panel  Disposition:  Status is: Inpatient Remains inpatient appropriate because:  -Improvement in oxygen -Evaluation by physical therapy -Evaluation for possible LTAC (although with continued improvement in oxygenation, may not need this)    Anticipated discharge date: 1/2 Family Communication: Updated daughter by phone 12/27 DVT Prophylaxis:  apixaban (ELIQUIS) tablet 5  mg    Author: Annita Brod ,MD 05/21/2022 7:37 PM  To reach On-call, see care teams to locate the attending and reach out via [email or web address]. Between 7PM-7AM, please contact night-coverage If you still have difficulty reaching the attending provider, please page the [facility] (Director on Call) for [facility] on amion for assistance.

## 2022-05-21 NOTE — TOC Progression Note (Signed)
Transition of Care [facility]) - Progression Note    Patient Details  Name: Christopher Mendez MRN: [id number] Date of Birth: [birth year]  Transition of Care [place]) CM/SW Contact  Gerilyn Pilgrim, LCSW Phone Number: 05/21/2022, 11:51 AM  Clinical Narrative:   Spoke with pt's spouse about LTC. [name] states that she would like to use [facility] in [place] will contact facility. SW LVM with Jaquelyn Bitter with [facility] [phone].          Expected Discharge Plan and Services                                               Social Determinants of Health (SDOH) Interventions SDOH Screenings   Tobacco Use: High Risk (02/26/2021)    Readmission Risk Interventions     No data to display

## 2022-05-21 NOTE — Progress Notes (Signed)
SUBJECTIVE: Patient is a 71 year old man with past medical history significant for tobacco abuse, COPD, HTN. Patient presented to ED on 05/14/22 with complaints of cough, fever, worsening SOB for several days. Patient tested positive for infuenza A. Patient developed new onset atrial fibrillation on 05/16/22.     Vitals:   05/21/22 0501 05/21/22 0543 05/21/22 0552 05/21/22 0854  BP: (!) 106/55     Pulse: 65 65 66   Resp: (!) 25 (!) 27 (!) 24   Temp:      TempSrc:      SpO2: 94% 94% 94% 93%  Weight:      Height:        Intake/Output Summary (Last 24 hours) at 05/21/2022 0902 Last data filed at 05/21/2022 0552 Gross per 24 hour  Intake 329.46 ml  Output 975 ml  Net -645.54 ml    LABS: Basic Metabolic Panel: Recent Labs    05/20/22 0445 05/21/22 0644  NA 138 139  K 4.5 4.3  CL 98 99  CO2 30 30  GLUCOSE 107* 95  BUN 32* 48*  CREATININE 0.90 0.91  CALCIUM 9.4 9.3   Liver Function Tests: No results for input(s): "AST", "ALT", "ALKPHOS", "BILITOT", "PROT", "ALBUMIN" in the last 72 hours. No results for input(s): "LIPASE", "AMYLASE" in the last 72 hours. CBC: No results for input(s): "WBC", "NEUTROABS", "HGB", "HCT", "MCV", "PLT" in the last 72 hours. Cardiac Enzymes: No results for input(s): "CKTOTAL", "CKMB", "CKMBINDEX", "TROPONINI" in the last 72 hours. BNP: Invalid input(s): "POCBNP" D-Dimer: No results for input(s): "DDIMER" in the last 72 hours. Hemoglobin A1C: No results for input(s): "HGBA1C" in the last 72 hours. Fasting Lipid Panel: No results for input(s): "CHOL", "HDL", "LDLCALC", "TRIG", "CHOLHDL", "LDLDIRECT" in the last 72 hours. Thyroid Function Tests: No results for input(s): "TSH", "T4TOTAL", "T3FREE", "THYROIDAB" in the last 72 hours.  Invalid input(s): "FREET3" Anemia Panel: No results for input(s): "VITAMINB12", "FOLATE", "FERRITIN", "TIBC", "IRON", "RETICCTPCT" in the last 72 hours.   PHYSICAL EXAM General: Well developed, well nourished,  in no acute distress HEENT:  Normocephalic and atramatic Neck:  No JVD.  Lungs: Clear bilaterally to auscultation and percussion. Heart: HRRR . Normal S1 and S2 without gallops or murmurs.  Abdomen: Bowel sounds are positive, abdomen soft and non-tender  Msk:  Back normal, normal gait. Normal strength and tone for age. Extremities: No clubbing, cyanosis or edema.   Neuro: Alert and oriented X 3. Psych:  Good affect, responds appropriately  TELEMETRY: NSR, HR 66 bpm  ASSESSMENT AND PLAN: Patient resting comfortably in bed. Denies chest pain, shortness of breath. Complains of feeling tired. Patient currently in NSR on po amiodarone and metoprolol. Waiting on LTAC placement. Will continue to follow.    Principal Problem:   Influenza A with pneumonia Active Problems:   Acute metabolic encephalopathy   Acute respiratory failure with hypoxia and hypercapnia (HCC)   COPD exacerbation (HCC)   HTN (hypertension)   Controlled type 2 diabetes mellitus without complication, without long-term current use of insulin (HCC)   Tobacco abuse   Overweight (BMI 25.0-29.9)   Atrial fibrillation with RVR (HCC)   Acute on chronic diastolic CHF (congestive heart failure) ([place])    Gerald Honea, FNP-C 05/21/2022 9:02 AM

## 2022-05-21 NOTE — Evaluation (Signed)
Occupational Therapy Evaluation Patient Details Name: Christopher Mendez MRN: [id number] DOB: 13-Oct-1950 Today's Date: 05/21/2022   History of Present Illness 71 y/o male presented to ED on 05/14/22 for respiratory distress with hypoxia and fall. Found to have influenza A with pneumonia and acute metabolic encephalopathy. PMH: COPD, HTN   Clinical Impression   Christopher Mendez demonstrates good improvement in fxl mobility and ADL performance today. He is able to perform bed mobility with SUPV and increased time and can come up into standing and transfer to recliner largely without physical assistance, although with heavy reliance on UE support. On 4L O2, O2 sats primarily at 91-92%. He does demonstrate fear of falling, initially declining OOB mobility by saying, "I can't walk." Asked to clarify what he means by that, he states that he had a fall a few days ago and is worried that he will have another if he gets OOB. After spending several minutes in static standing w/o any LOB, pt becomes more comfortable and agrees to short-distance ambulation, again completed without LOB. Pt is debilitated and well off his norm -- he describes himself as physically active at home, recently performing such tasks as yard work, gardening, chopping wood, and Occupational psychologist. Recommend DC to SNF to allow pt to rebuild his strength and endurance, maximizing possibility of return to PLOF.     Recommendations for follow up therapy are one component of a multi-disciplinary discharge planning process, led by the attending physician.  Recommendations may be updated based on patient status, additional functional criteria and insurance authorization.   Follow Up Recommendations  Skilled nursing-short term rehab (<3 hours/day)     Assistance Recommended at Discharge Frequent or constant Supervision/Assistance  Patient can return home with the following A little help with walking and/or transfers;A little help with  bathing/dressing/bathroom;Assistance with cooking/housework;Assist for transportation    Functional Status Assessment     Equipment Recommendations  None recommended by OT    Recommendations for Other Services       Precautions / Restrictions Precautions Precautions: Fall Precaution Comments: watch O2 Restrictions Weight Bearing Restrictions: No      Mobility Bed Mobility Overal bed mobility: Needs Assistance Bed Mobility: Supine to Sit     Supine to sit: Supervision          Transfers Overall transfer level: Needs assistance Equipment used: Rolling walker (2 wheels) Transfers: Sit to/from Stand, Bed to chair/wheelchair/BSC Sit to Stand: Min guard     Step pivot transfers: Min guard            Balance Overall balance assessment: Needs assistance Sitting-balance support: No upper extremity supported, Feet supported Sitting balance-Leahy Scale: Good     Standing balance support: Bilateral upper extremity supported, Reliant on assistive device for balance Standing balance-Leahy Scale: Fair Standing balance comment: pt repeatedly expresses fear of falling                           ADL either performed or assessed with clinical judgement   ADL Overall ADL's : Needs assistance/impaired     Grooming: Wash/dry hands;Wash/dry face;Sitting;Minimal assistance           Upper Body Dressing : Min guard   Lower Body Dressing: Minimal assistance                       Vision         Perception     Praxis  Pertinent Vitals/Pain Pain Assessment Pain Assessment: Faces Faces Pain Scale: Hurts a little bit Pain Location: slight pain in L elbow, which he hit during fall Pain Descriptors / Indicators: Discomfort Pain Intervention(s): Repositioned     Hand Dominance     Extremity/Trunk Assessment Upper Extremity Assessment Upper Extremity Assessment: Generalized weakness   Lower Extremity Assessment Lower Extremity  Assessment: Generalized weakness       Communication Communication Communication: No difficulties   Cognition Arousal/Alertness: Awake/alert Behavior During Therapy: WFL for tasks assessed/performed Overall Cognitive Status: Within Functional Limits for tasks assessed                                 General Comments: Alert and oriented to time, place, situation. Slightly delayed processing and speech.     General Comments  O2 sats at 90% or higher for most of session, with occasion drops to 87-88%, w/ quick recovery    Exercises Other Exercises Other Exercises: Educ re: falls prevention, routines modifications, DC recs   Shoulder Instructions      Home Living Family/patient expects to be discharged to:: Private residence Living Arrangements: Spouse/significant other Available Help at Discharge: Family Type of Home: House Home Access: Stairs to enter Technical brewer of Steps: 1   Home Layout: One level     Bathroom Shower/Tub: Tub/shower unit (large cast iron tub, with very high clearance)   Bathroom Toilet: Standard     Home Equipment: Conservation officer, nature (2 wheels);Grab bars - tub/shower          Prior Functioning/Environment Prior Level of Function : Independent/Modified Independent             Mobility Comments: Occasional use of RW. Reports 1 fall in past 6 months. [place] and active outdoors on a regular basis. ADLs Comments: IND, although does not drive. Pt reports he does housecleaning, wife does cooking, they both do shopping.        OT Problem List: Decreased strength;Decreased activity tolerance;Impaired balance (sitting and/or standing);Cardiopulmonary status limiting activity;Decreased knowledge of use of DME or AE      OT Treatment/Interventions: Self-care/ADL training;Therapeutic exercise;Patient/family education;Balance training;Energy conservation;Therapeutic activities;DME and/or AE instruction    OT Goals(Current  goals can be found in the care plan section) Acute Rehab OT Goals Patient Stated Goal: to get home and to not fall again OT Goal Formulation: With patient Time For Goal Achievement: 06/04/22 Potential to Achieve Goals: Good ADL Goals Pt Will Perform Grooming: with modified independence;standing (in standing at sink for 5+ minutes without SOB or LOB) Pt Will Transfer to Toilet: with modified independence;regular height toilet;stand pivot transfer;ambulating Pt/caregiver will Perform Home Exercise Program: Increased ROM;Increased strength;Independently  OT Frequency: Min 2X/week    Co-evaluation              AM-PAC OT "6 Clicks" Daily Activity     Outcome Measure Help from another person eating meals?: None Help from another person taking care of personal grooming?: A Little Help from another person toileting, which includes using toliet, bedpan, or urinal?: A Lot Help from another person bathing (including washing, rinsing, drying)?: A Lot Help from another person to put on and taking off regular upper body clothing?: A Little Help from another person to put on and taking off regular lower body clothing?: A Little 6 Click Score: 17   End of Session Equipment Utilized During Treatment: Rolling walker (2 wheels)  Activity Tolerance: Patient tolerated  treatment well Patient left: in chair;with call bell/phone within reach;with nursing/sitter in room  OT Visit Diagnosis: Unsteadiness on feet (R26.81);Muscle weakness (generalized) (M62.81)                Time: 9371-6967 OT Time Calculation (min): 28 min Charges:  OT General Charges $OT Visit: 1 Visit OT Evaluation $OT Eval Low Complexity: 1 Low OT Treatments $Self Care/Home Management : 23-37 mins Josiah Lobo, PhD, MS, OTR/L 05/21/22, 3:39 PM

## 2022-05-22 DIAGNOSIS — J09X1 Influenza due to identified novel influenza A virus with pneumonia: Secondary | ICD-10-CM | POA: Diagnosis not present

## 2022-05-22 LAB — BASIC METABOLIC PANEL
Anion gap: 7 (ref 5–15)
BUN: 35 mg/dL — ABNORMAL HIGH (ref 8–23)
CO2: 31 mmol/L (ref 22–32)
Calcium: 9.4 mg/dL (ref 8.9–10.3)
Chloride: 103 mmol/L (ref 98–111)
Creatinine, Ser: 0.82 mg/dL (ref 0.61–1.24)
GFR, Estimated: >60 mL/min (ref >60)
Glucose, Bld: 109 mg/dL — ABNORMAL HIGH (ref 70–99)
Potassium: 4.4 mmol/L (ref 3.5–5.1)
Sodium: 141 mmol/L (ref 135–145)

## 2022-05-22 LAB — GLUCOSE, CAPILLARY
Glucose-Capillary: 108 mg/dL — ABNORMAL HIGH (ref 70–99)
Glucose-Capillary: 127 mg/dL — ABNORMAL HIGH (ref 70–99)
Glucose-Capillary: 121 mg/dL — ABNORMAL HIGH (ref 70–99)
Glucose-Capillary: 146 mg/dL — ABNORMAL HIGH (ref 70–99)

## 2022-05-22 MED ORDER — FUROSEMIDE 20 MG PO TABS
20.0000 mg | ORAL_TABLET | Freq: Every day | ORAL | Status: DC
  Administered 2022-05-23 – 2022-05-25 (×3): 20 mg via ORAL
  Filled 2022-05-22 (×3): qty 1

## 2022-05-22 NOTE — Progress Notes (Signed)
Physical Therapy Treatment Patient Details Name: Christopher Mendez MRN: [id number] DOB: 02/23/1951 Today's Date: 05/22/2022   History of Present Illness 71 y/o male presented to ED on 05/14/22 for respiratory distress with hypoxia and fall. Found to have influenza A with pneumonia and acute metabolic encephalopathy. PMH: COPD, HTN    PT Comments    Patient supine in bed and eager to mobilize this session. On 5L O2, VSS throughout. Able to come to EOB with supervision. Attempted x 3 to stand prior to being able to complete standing with minA to steady. Ambulated ~15' in room with RW and minA for balance and Rw management prior to needing to sit in recliner. Able to stand from recliner with min guard. Appreciative of PT for getting OOB as patient has been fearful of mobility due to recent fall and this assisted with building confidence. Continue to recommend SNF for ongoing Physical Therapy.      Recommendations for follow up therapy are one component of a multi-disciplinary discharge planning process, led by the attending physician.  Recommendations may be updated based on patient status, additional functional criteria and insurance authorization.  Follow Up Recommendations  Skilled nursing-short term rehab (<3 hours/day) Can patient physically be transported by private vehicle: Yes   Assistance Recommended at Discharge Frequent or constant Supervision/Assistance  Patient can return home with the following A little help with walking and/or transfers;A little help with bathing/dressing/bathroom;Assistance with cooking/housework;Help with stairs or ramp for entrance;Assist for transportation;Direct supervision/assist for financial management;Direct supervision/assist for medications management   Equipment Recommendations  Rolling Malicia Blasdel (2 wheels)    Recommendations for Other Services OT consult     Precautions / Restrictions Precautions Precautions: Fall Precaution Comments: watch  O2 Restrictions Weight Bearing Restrictions: No     Mobility  Bed Mobility Overal bed mobility: Needs Assistance Bed Mobility: Supine to Sit     Supine to sit: Supervision     General bed mobility comments: increased time to complete    Transfers Overall transfer level: Needs assistance Equipment used: Rolling Tashi Band (2 wheels) Transfers: Sit to/from Stand Sit to Stand: Min assist           General transfer comment: required x 3 attempts to stand with minA to steady as patient with initial posterior lean    Ambulation/Gait Ambulation/Gait assistance: Min assist Gait Distance (Feet): 15 Feet Assistive device: Rolling Yoselin Amerman (2 wheels) Gait Pattern/deviations: Step-to pattern, Decreased stride length, Narrow base of support Gait velocity: decreased     General Gait Details: very slow cautious steps within room. Assist for balance and RW management.   Stairs             Wheelchair Mobility    Modified Rankin (Stroke Patients Only)       Balance Overall balance assessment: Needs assistance Sitting-balance support: No upper extremity supported, Feet supported Sitting balance-Leahy Scale: Good     Standing balance support: Bilateral upper extremity supported, Reliant on assistive device for balance Standing balance-Leahy Scale: Fair                              Cognition Arousal/Alertness: Awake/alert Behavior During Therapy: WFL for tasks assessed/performed Overall Cognitive Status: Impaired/Different from baseline                   Orientation Level: Disoriented to, Place Current Attention Level: Sustained Memory: Decreased short-term memory, Decreased recall of precautions Following Commands: Follows one step commands with  increased time Safety/Judgement: Decreased awareness of safety Awareness: Emergent Problem Solving: Slow processing, Difficulty sequencing, Requires verbal cues General Comments: at times gettting  confused in conversation and forgetful of place. Increased time to process commands but follows appropriately.        Exercises      General Comments General comments (skin integrity, edema, etc.): On 5L O2, VSS throughout      Pertinent Vitals/Pain Pain Assessment Pain Assessment: Faces Faces Pain Scale: Hurts a little bit Pain Location: generalized Pain Descriptors / Indicators: Grimacing Pain Intervention(s): Monitored during session    Home Living                          Prior Function            PT Goals (current goals can now be found in the care plan section) Acute Rehab PT Goals Patient Stated Goal: to get stronger PT Goal Formulation: With patient Time For Goal Achievement: 06/02/22 Potential to Achieve Goals: Good Progress towards PT goals: Progressing toward goals    Frequency    Min 2X/week      PT Plan Current plan remains appropriate    Co-evaluation              AM-PAC PT "6 Clicks" Mobility   Outcome Measure  Help needed turning from your back to your side while in a flat bed without using bedrails?: A Little Help needed moving from lying on your back to sitting on the side of a flat bed without using bedrails?: A Little Help needed moving to and from a bed to a chair (including a wheelchair)?: A Little Help needed standing up from a chair using your arms (e.g., wheelchair or bedside chair)?: A Little Help needed to walk in hospital room?: A Little Help needed climbing 3-5 steps with a railing? : Total 6 Click Score: 16    End of Session Equipment Utilized During Treatment: Oxygen;Gait belt Activity Tolerance: Patient tolerated treatment well Patient left: in chair;with call bell/phone within reach;Other (comment) (notified RN of chair alarm pad under patient no box in room to plug it into) Nurse Communication: Mobility status PT Visit Diagnosis: Unsteadiness on feet (R26.81);Muscle weakness (generalized) (M62.81);History  of falling (Z91.81);Difficulty in walking, not elsewhere classified (R26.2)     Time: 1660-6301 PT Time Calculation (min) (ACUTE ONLY): 24 min  Charges:  $Gait Training: 8-22 mins $Therapeutic Activity: 8-22 mins                     Jurgen Groeneveld A. Gilford Rile PT, DPT [facility] - Acute Rehabilitation Services    Elmyra Banwart A Rome Schlauch 05/22/2022, 1:44 PM

## 2022-05-22 NOTE — Progress Notes (Signed)
[place] [facility] [place] NAME: Christopher Mendez    MR#:  [id number]  DATE OF BIRTH:  1950-11-01  SUBJECTIVE:  No family at bedside Transferred out of ICU On 4 L [place] No cp or sob HR 70's NSR    VITALS:  Blood pressure 129/76, pulse 76, temperature 98 F (36.7 C), resp. rate 18, height 6' (1.829 m), weight 86.7 kg, SpO2 95 %.  PHYSICAL EXAMINATION:   GENERAL:  71 y.o.-year-old patient lying in the bed with no acute distress.  LUNGS: decreased breath sounds bilaterally, no wheezing CARDIOVASCULAR: S1, S2 normal. No murmur NSR ABDOMEN: Soft, nontender, nondistended. Bowel sounds present.  EXTREMITIES: No  edema b/l.    NEUROLOGIC: nonfocal  patient is alert and awake SKIN: No obvious rash, lesion, or ulcer.   LABORATORY PANEL:  CBC Recent Labs  Lab 05/18/22 0440  WBC 3.1*  HGB 15.8  HCT 49.0  PLT 128*    Chemistries  Recent Labs  Lab 05/16/22 0427 05/17/22 0450 05/22/22 0626  NA 142   < > 141  K 4.8   < > 4.4  CL 108   < > 103  CO2 29   < > 31  GLUCOSE 111*   < > 109*  BUN 34*   < > 35*  CREATININE 0.79   < > 0.82  CALCIUM 8.9   < > 9.4  MG 2.4  --   --    < > = values in this interval not displayed.   Assessment and Plan 71 year old male with past medical history of ongoing tobacco abuse, COPD and hypertension brought to the emergency room on the early morning hours of 12/21 for cough, fever and progressively worsening shortness of breath times several days. The emergency room, patient noted to be significantly hypoxic and febrile and positive for influenza A. Patient also noted to be confused and ABG noted hypercapnia. Patient admitted to the hospitalist service and critical care consulted. Patient placed on BiPAP and overnight, mentation and hypercapnia improved.    Influenza A with pneumonia Patient presents for evaluation of altered mental status and weakness and his influenza A PCR is positive.   --Pro-calcitonin  essentially normal.  Antibiotics discontinued.   --CT scan of chest has atelectasis versus infiltrate   Acute respiratory failure with hypoxia and hypercapnia (HCC) --Improved on BiPAP and able to titrate down oxygen, currently on 4 L [place].   Atrial fibrillation with RVR ([place]) New onset.  Cardiology Dr Humphrey Rolls consulted.  Echocardiogram notes bilateral severe atrial dilatation.   Cont Amiodarone and BB  Started on anticoagulation   Acute metabolic encephalopathy Initially quite lethargic.  At baseline, awake alert and oriented.    Improved during the day.   COPD exacerbation (HCC) Acute exacerbation secondary to influenza A infection Continue bronchodilator therapy and inhaled steroids   HTN (hypertension) Hold oral antihypertensive agents Place patient on as needed hydralazine for systolic blood pressure greater than 160   Controlled type 2 diabetes mellitus without complication, without long-term current use of insulin (HCC) Check blood sugars every 4 hours while patient is n.p.o. CBGs stable, even despite steroids   Tobacco abuse Nicotine patch   Overweight (BMI 25.0-29.9) Meets criteria with BMI greater than 25   Acute on chronic diastolic CHF (congestive heart failure) (HCC) Echocardiogram notes grade 3 diastolic dysfunction.  Patient has responded well and has diuresed almost 11 L and is almost -7 L deficient.  BNP on 12/26 within normal  limits     Pressure ulcer: Stage II on sacrum, present on admission      Pressure Injury 05/14/22 Sacrum Mid Deep Tissue Pressure Injury - Purple or maroon localized area of discolored intact skin or blood-filled blister due to damage of underlying soft tissue from pressure and/or shear. (Active)  05/14/22 2030  Location: Sacrum  Location Orientation: Mid  Staging: Deep Tissue Pressure Injury - Purple or maroon localized area of discolored intact skin or blood-filled blister due to damage of underlying soft tissue from pressure and/or  shear.  Wound Description (Comments):   Present on Admission: Yes  Dressing Type Foam - Lift dressing to assess site every shift 05/21/22 1600      Family communication : Consults :PCCM, cardiology CODE STATUS: FULL DVT Prophylaxis :eliquis Level of care: Telemetry Medical Status is: Inpatient Remains inpatient appropriate because: Transfer out of ICU and work on discharge [place] THIS PATIENT: 35 minutes.  >50% time spent on counselling and coordination of care  Note: This dictation was prepared with Dragon dictation along with smaller phrase technology. Any transcriptional errors that result from this process are unintentional.  [name] M.D    [facility]   CC: Primary care physician; Christopher Richards, MD

## 2022-05-22 NOTE — Progress Notes (Signed)
SUBJECTIVE: Patient is a 71 year old man with past medical history significant for tobacco abuse, COPD, HTN. Patient presented to ED on 05/14/22 with complaints of cough, fever, worsening SOB for several days. Patient tested positive for infuenza A. Patient developed new onset atrial fibrillation on 05/16/22.     Vitals:   05/22/22 0619 05/22/22 0700 05/22/22 0738 05/22/22 0800  BP:  114/64  (!) 84/66  Pulse: 77 73  77  Resp: (!) 22 (!) 25  (!) 21  Temp:      TempSrc:    Oral  SpO2: 92% 95% 93% (!) 89%  Weight:      Height:        Intake/Output Summary (Last 24 hours) at 05/22/2022 0854 Last data filed at 05/22/2022 0800 Gross per 24 hour  Intake 480.46 ml  Output 1450 ml  Net -969.54 ml    LABS: Basic Metabolic Panel: Recent Labs    05/21/22 0644 05/22/22 0626  NA 139 141  K 4.3 4.4  CL 99 103  CO2 30 31  GLUCOSE 95 109*  BUN 48* 35*  CREATININE 0.91 0.82  CALCIUM 9.3 9.4   Liver Function Tests: No results for input(s): "AST", "ALT", "ALKPHOS", "BILITOT", "PROT", "ALBUMIN" in the last 72 hours. No results for input(s): "LIPASE", "AMYLASE" in the last 72 hours. CBC: No results for input(s): "WBC", "NEUTROABS", "HGB", "HCT", "MCV", "PLT" in the last 72 hours. Cardiac Enzymes: No results for input(s): "CKTOTAL", "CKMB", "CKMBINDEX", "TROPONINI" in the last 72 hours. BNP: Invalid input(s): "POCBNP" D-Dimer: No results for input(s): "DDIMER" in the last 72 hours. Hemoglobin A1C: No results for input(s): "HGBA1C" in the last 72 hours. Fasting Lipid Panel: No results for input(s): "CHOL", "HDL", "LDLCALC", "TRIG", "CHOLHDL", "LDLDIRECT" in the last 72 hours. Thyroid Function Tests: No results for input(s): "TSH", "T4TOTAL", "T3FREE", "THYROIDAB" in the last 72 hours.  Invalid input(s): "FREET3" Anemia Panel: No results for input(s): "VITAMINB12", "FOLATE", "FERRITIN", "TIBC", "IRON", "RETICCTPCT" in the last 72 hours.   PHYSICAL EXAM General: Well developed,  well nourished, in no acute distress HEENT:  Normocephalic and atramatic Neck:  No JVD.  Lungs: Clear bilaterally to auscultation and percussion. Heart: HRRR . Normal S1 and S2 without gallops or murmurs.  Abdomen: Bowel sounds are positive, abdomen soft and non-tender  Msk:  Back normal, normal gait. Normal strength and tone for age. Extremities: No clubbing, cyanosis or edema.   Neuro: Alert and oriented X 3. Psych:  Good affect, responds appropriately  TELEMETRY: sinus rhythm, HR 76 bpm  ASSESSMENT AND PLAN: Patient resting comfortably in bed. Denies chest pain, shortness of breath, dizziness. Patient currently in NSR on po amiodarone and metoprolol. Waiting on LTAC placement. Will continue to follow.    Principal Problem:   Influenza A with pneumonia Active Problems:   Acute metabolic encephalopathy   Acute respiratory failure with hypoxia and hypercapnia (HCC)   COPD exacerbation (HCC)   HTN (hypertension)   Controlled type 2 diabetes mellitus without complication, without long-term current use of insulin (HCC)   Tobacco abuse   Overweight (BMI 25.0-29.9)   Atrial fibrillation with RVR (HCC)   Acute on chronic diastolic CHF (congestive heart failure) ([place])    Christopher Waren, FNP-C 05/22/2022 8:54 AM

## 2022-05-23 DIAGNOSIS — J441 Chronic obstructive pulmonary disease with (acute) exacerbation: Secondary | ICD-10-CM | POA: Diagnosis not present

## 2022-05-23 DIAGNOSIS — J09X1 Influenza due to identified novel influenza A virus with pneumonia: Secondary | ICD-10-CM | POA: Diagnosis not present

## 2022-05-23 DIAGNOSIS — E119 Type 2 diabetes mellitus without complications: Secondary | ICD-10-CM

## 2022-05-23 DIAGNOSIS — J9601 Acute respiratory failure with hypoxia: Secondary | ICD-10-CM | POA: Diagnosis not present

## 2022-05-23 DIAGNOSIS — I4891 Unspecified atrial fibrillation: Secondary | ICD-10-CM | POA: Diagnosis not present

## 2022-05-23 LAB — GLUCOSE, CAPILLARY
Glucose-Capillary: 101 mg/dL — ABNORMAL HIGH (ref 70–99)
Glucose-Capillary: 102 mg/dL — ABNORMAL HIGH (ref 70–99)
Glucose-Capillary: 121 mg/dL — ABNORMAL HIGH (ref 70–99)
Glucose-Capillary: 138 mg/dL — ABNORMAL HIGH (ref 70–99)

## 2022-05-23 MED ORDER — IPRATROPIUM-ALBUTEROL 0.5-2.5 (3) MG/3ML IN SOLN
3.0000 mL | Freq: Two times a day (BID) | RESPIRATORY_TRACT | Status: DC
  Administered 2022-05-23 – 2022-05-25 (×4): 3 mL via RESPIRATORY_TRACT
  Filled 2022-05-23 (×4): qty 3

## 2022-05-23 MED ORDER — VITAMIN D 25 MCG (1000 UNIT) PO TABS
1000.0000 [IU] | ORAL_TABLET | Freq: Every day | ORAL | Status: DC
  Administered 2022-05-23 – 2022-05-25 (×3): 1000 [IU] via ORAL
  Filled 2022-05-23 (×3): qty 1

## 2022-05-23 MED ORDER — TIOTROPIUM BROMIDE MONOHYDRATE 18 MCG IN CAPS
18.0000 ug | ORAL_CAPSULE | Freq: Every day | RESPIRATORY_TRACT | Status: DC
  Administered 2022-05-23 – 2022-05-25 (×3): 18 ug via RESPIRATORY_TRACT
  Filled 2022-05-23: qty 5

## 2022-05-23 NOTE — NC FL2 (Signed)
[place] LEVEL OF CARE FORM     IDENTIFICATION  Patient Name: Christopher Mendez Birthdate: 1951-02-28 Sex: male Admission Date (Current Location): 05/13/2022  [facility] and [place] Number:  Engineering geologist and Address:  [facility], [address] [place], [place] [address]      Provider Number: [id number]  Attending Physician Name and Address:  [name], MD  Relative Name and Phone Number:  Tyjae, Issa [facility]) [phone] [facility] Phone)    Current Level of Care: Hospital Recommended Level of Care: [place] Prior Approval Number:    Date Approved/Denied:   PASRR Number: [id number]  Discharge Plan:      Current Diagnoses: Patient Active Problem List   Diagnosis Date Noted   Acute on chronic diastolic CHF (congestive heart failure) ([place]) 05/17/2022   Atrial fibrillation with RVR ([place]) 05/16/2022   Tobacco abuse 05/15/2022   Overweight (BMI 25.0-29.9) 05/15/2022   Influenza A with pneumonia 59/16/3846   Acute metabolic encephalopathy 65/99/3570   Acute respiratory failure with hypoxia and hypercapnia ([place]) 05/14/2022   COPD exacerbation ([place]) 05/14/2022   HTN (hypertension) 05/14/2022   Controlled type 2 diabetes mellitus without complication, without long-term current use of insulin ([place]) 05/14/2022   Acute renal failure (ARF) ([place]) 07/05/2017    Orientation RESPIRATION BLADDER Height & Weight     Self, Time, Situation, Place  O2 External catheter Weight: 191 lb 2.2 oz (86.7 kg) Height:  6' (182.9 cm)  BEHAVIORAL SYMPTOMS/MOOD NEUROLOGICAL BOWEL NUTRITION STATUS      Continent Diet (2 gram sodium)  AMBULATORY STATUS COMMUNICATION OF NEEDS Skin   Limited Assist Verbally Skin abrasions, Bruising (missing toe nail R great toe, pressure injury sacrum)                       Personal Care Assistance Level of Assistance  Bathing, Feeding, Dressing Bathing Assistance: Limited assistance Feeding  assistance: Limited assistance Dressing Assistance: Limited assistance     Functional Limitations Info             SPECIAL CARE FACTORS FREQUENCY  PT (By licensed PT), OT (By licensed OT)     PT Frequency: 5 times per week OT Frequency: 5 times per week            Contractures      Additional Factors Info  Code Status, Allergies Code Status Info: full Allergies Info: Flomax (Tamsulosin Hcl), Myrbetriq (Mirabegron)           Current Medications (05/23/2022):  This is the current hospital active medication list Current Facility-Administered Medications  Medication Dose Route Frequency Provider Last Rate Last Admin   acetaminophen (TYLENOL) tablet 650 mg  650 mg Oral Q6H PRN Agbata, Tochukwu, MD       Or   acetaminophen (TYLENOL) suppository 650 mg  650 mg Rectal Q6H PRN Agbata, Tochukwu, MD   650 mg at 05/14/22 1737   amiodarone (PACERONE) tablet 200 mg  200 mg Oral Daily Neoma Laming A, MD   200 mg at 05/22/22 0817   apixaban (ELIQUIS) tablet 5 mg  5 mg Oral BID Coulter, Hoyle Sauer, RPH   5 mg at 05/22/22 2214   budesonide (PULMICORT) nebulizer solution 0.25 mg  0.25 mg Nebulization BID Agbata, Tochukwu, MD   0.25 mg at 05/23/22 1779   Chlorhexidine Gluconate Cloth 2 % PADS 6 each  6 each Topical Daily Agbata, Tochukwu, MD   6 each at 05/22/22 1156   cholecalciferol (  VITAMIN D3) 25 MCG (1000 UNIT) tablet 1,000 Units  1,000 Units Oral Daily [name], MD       diphenhydrAMINE (BENADRYL) capsule 25 mg  25 mg Oral QHS PRN Annita Brod, MD       furosemide (LASIX) tablet 20 mg  20 mg Oral Daily [name], MD       insulin aspart (novoLOG) injection 0-15 Units  0-15 Units Subcutaneous TID AC & HS Sharion Settler, NP   2 Units at 05/22/22 2214   ipratropium-albuterol (DUONEB) 0.5-2.5 (3) MG/3ML nebulizer solution 3 mL  3 mL Nebulization Q6H PRN Agbata, Tochukwu, MD   3 mL at 05/14/22 0943   ipratropium-albuterol (DUONEB) 0.5-2.5 (3) MG/3ML nebulizer solution 3 mL  3  mL Nebulization TID Darel Hong D, NP   3 mL at 05/23/22 0803   metoprolol tartrate (LOPRESSOR) tablet 12.5 mg  12.5 mg Oral BID Annita Brod, MD   12.5 mg at 05/22/22 2214   nicotine (NICODERM CQ - dosed in mg/24 hours) patch 21 mg  21 mg Transdermal Daily Dgayli, Berdine Addison, MD   21 mg at 05/22/22 0821   ondansetron (ZOFRAN) tablet 4 mg  4 mg Oral Q6H PRN Agbata, Tochukwu, MD       Or   ondansetron (ZOFRAN) injection 4 mg  4 mg Intravenous Q6H PRN Agbata, Tochukwu, MD   4 mg at 05/20/22 0537   tiotropium (SPIRIVA) inhalation capsule ([facility] use ONLY) 18 mcg  18 mcg Inhalation Daily [name], MD         Discharge Medications: Please see discharge summary for a list of discharge medications.  Relevant Imaging Results:  Relevant Lab Results:   Additional Information SS #: [id number]  [place], LCSW

## 2022-05-23 NOTE — Plan of Care (Signed)
  Problem: Health Behavior/Discharge Planning: Goal: Ability to manage health-related needs will improve Outcome: Progressing   Problem: Clinical Measurements: Goal: Diagnostic test results will improve Outcome: Progressing Goal: Respiratory complications will improve Outcome: Progressing   Problem: Nutrition: Goal: Adequate nutrition will be maintained Outcome: Progressing

## 2022-05-23 NOTE — TOC Progression Note (Addendum)
Transition of Care [facility]) - Progression Note    Patient Details  Name: Christopher Mendez MRN: [id number] Date of Birth: [birth year]  Transition of [facility] CM/SW [place], LCSW Phone Number: 05/23/2022, 9:03 AM  Clinical Narrative:    Spoke to MD. Patient has now improved and no longer needs LTAC, needs SNF. Called patient's spouse who is agreeable to SNF. She prefers [facility] in [place]. SNF work up started.         Expected Discharge Plan and Services                                               Social Determinants of Health (SDOH) Interventions SDOH Screenings   Tobacco Use: High Risk (02/26/2021)    Readmission Risk Interventions     No data to display

## 2022-05-23 NOTE — Plan of Care (Signed)

## 2022-05-23 NOTE — Progress Notes (Signed)
[place] at [place] NAME: Christopher Mendez    MR#:  [id number]  DATE OF BIRTH:  [birth year]  SUBJECTIVE:  Wife admitted across pt's room Transferred out of ICU On 3.5 L [place] No cp or sob HR 70's NSR  VITALS:  Blood pressure 128/64, pulse 75, temperature 97.9 F (36.6 C), resp. rate 16, height 6' (1.829 m), weight 86.7 kg, SpO2 96 %.  PHYSICAL EXAMINATION:   GENERAL:  71 y.o.-year-old patient lying in the bed with no acute distress.  LUNGS: decreased breath sounds bilaterally, no wheezing CARDIOVASCULAR: S1, S2 normal. No murmur NSR ABDOMEN: Soft, nontender, nondistended. Bowel sounds present.  EXTREMITIES: No  edema b/l.    NEUROLOGIC: nonfocal  patient is alert and awake SKIN: No obvious rash, lesion, or ulcer.   LABORATORY PANEL:  CBC Recent Labs  Lab 05/18/22 0440  WBC 3.1*  HGB 15.8  HCT 49.0  PLT 128*     Chemistries  Recent Labs  Lab 05/22/22 0626  NA 141  K 4.4  CL 103  CO2 31  GLUCOSE 109*  BUN 35*  CREATININE 0.82  CALCIUM 9.4    Assessment and Plan 71 year old male with past medical history of ongoing tobacco abuse, COPD and hypertension brought to the emergency room on the early morning hours of 12/21 for cough, fever and progressively worsening shortness of breath times several days. The emergency room, patient noted to be significantly hypoxic and febrile and positive for influenza A. Patient also noted to be confused and ABG noted hypercapnia. Patient admitted to the hospitalist service and critical care consulted. Patient placed on BiPAP and overnight, mentation and hypercapnia improved.    Influenza A with pneumonia Patient presents for evaluation of altered mental status and weakness and his influenza A PCR is positive.   --Pro-calcitonin essentially normal.  Antibiotics discontinued.   --CT scan of chest has atelectasis versus infiltrate   Acute respiratory failure with hypoxia and hypercapnia  (HCC) --Improved on BiPAP and able to titrate down oxygen, currently on 4 L [place]. --wean down as tolerated   Atrial fibrillation with RVR ([place]) New onset.  Cardiology Christopher Mendez consulted.  Echocardiogram notes bilateral severe atrial dilatation.   Cont Amiodarone and BB  Started on anticoagulation   Acute metabolic encephalopathy Initially quite lethargic.  At baseline, awake alert and oriented.    Improved during the day.   COPD exacerbation (HCC) Acute exacerbation secondary to influenza A infection Continue bronchodilator therapy and inhaled steroids   HTN (hypertension) Hold oral antihypertensive agents Place patient on as needed hydralazine for systolic blood pressure greater than 160   Controlled type 2 diabetes mellitus without complication, without long-term current use of insulin (HCC) Check blood sugars every 4 hours while patient is n.p.o. CBGs stable, even despite steroids   Tobacco abuse Nicotine patch   Overweight (BMI 25.0-29.9) Meets criteria with BMI greater than 25   Acute on chronic diastolic CHF (congestive heart failure) (HCC) Echocardiogram notes grade 3 diastolic dysfunction.  Patient has responded well and has diuresed almost 13.2 L  --BNP on 12/26 within normal limits     Pressure ulcer: Stage II on sacrum, present on admission      Pressure Injury 05/14/22 Sacrum Mid Deep Tissue Pressure Injury - Purple or maroon localized area of discolored intact skin or blood-filled blister due to damage of underlying soft tissue from pressure and/or shear. (Active)  05/14/22 2030  Location: Sacrum  Location Orientation: Mid  Staging:  Deep Tissue Pressure Injury - Purple or maroon localized area of discolored intact skin or blood-filled blister due to damage of underlying soft tissue from pressure and/or shear.  Wound Description (Comments):   Present on Admission: Yes  Dressing Type Foam - Lift dressing to assess site every shift 05/21/22 1600      Family  communication :spoke with wife Consults :PCCM, cardiology CODE STATUS: FULL DVT Prophylaxis :eliquis Level of care: Telemetry Medical Status is: Inpatient Remains inpatient appropriate because: D/c planning to Rehab   TOTAL TIME TAKING CARE OF THIS PATIENT: 35 minutes.  >50% time spent on counselling and coordination of care  Note: This dictation was prepared with Dragon dictation along with smaller phrase technology. Any transcriptional errors that result from this process are unintentional.  Christopher Mendez M.D    [facility]   CC: Primary care physician; Christopher Richards, MD

## 2022-05-24 DIAGNOSIS — J09X1 Influenza due to identified novel influenza A virus with pneumonia: Secondary | ICD-10-CM | POA: Diagnosis not present

## 2022-05-24 DIAGNOSIS — J441 Chronic obstructive pulmonary disease with (acute) exacerbation: Secondary | ICD-10-CM | POA: Diagnosis not present

## 2022-05-24 DIAGNOSIS — J9601 Acute respiratory failure with hypoxia: Secondary | ICD-10-CM | POA: Diagnosis not present

## 2022-05-24 DIAGNOSIS — I4891 Unspecified atrial fibrillation: Secondary | ICD-10-CM | POA: Diagnosis not present

## 2022-05-24 LAB — GLUCOSE, CAPILLARY
Glucose-Capillary: 115 mg/dL — ABNORMAL HIGH (ref 70–99)
Glucose-Capillary: 103 mg/dL — ABNORMAL HIGH (ref 70–99)
Glucose-Capillary: 111 mg/dL — ABNORMAL HIGH (ref 70–99)
Glucose-Capillary: 116 mg/dL — ABNORMAL HIGH (ref 70–99)

## 2022-05-24 MED ORDER — METFORMIN HCL 500 MG PO TABS: 1000.0000 mg | ORAL_TABLET | Freq: Two times a day (BID) | ORAL | Status: DC

## 2022-05-24 MED ORDER — METFORMIN HCL 500 MG PO TABS
500.0000 mg | ORAL_TABLET | Freq: Two times a day (BID) | ORAL | Status: DC
  Administered 2022-05-24 – 2022-05-25 (×3): 500 mg via ORAL
  Filled 2022-05-24 (×3): qty 1

## 2022-05-24 MED ORDER — MELATONIN 5 MG PO TABS: 5.0000 mg | ORAL_TABLET | Freq: Every evening | ORAL | Status: DC | PRN

## 2022-05-24 NOTE — TOC Progression Note (Addendum)
Transition of Care [facility]) - Progression Note    Patient Details  Name: ZONG MCQUARRIE MRN: [id number] Date of Birth: Feb 20, 1951  Transition of [facility]) CM/SW [place], LCSW Phone Number: 05/24/2022, 8:49 AM  Clinical Narrative:    CSW asked Ricky with [facility] to review referral as this is family's preference, Audry Pili stated he will review referral this afternoon.   12:22- Ricky unable to pull up Epic. Sent referral via encrypted email per Ricky's request.   2:20- Per Audry Pili, he is waiting for his DON to review referral.        Expected Discharge Plan and Services                                               Social Determinants of Health (SDOH) Interventions SDOH Screenings   Tobacco Use: High Risk (02/26/2021)    Readmission Risk Interventions     No data to display

## 2022-05-24 NOTE — Progress Notes (Signed)
[place] at [place] NAME: Christopher Mendez    MR#:  [id number]  DATE OF BIRTH:  Sep 24, 1950  SUBJECTIVE:  Wife admitted across pt's room Pt out in the chair On 3.0 L [place] No cp or sob HR 70's NSR  VITALS:  Blood pressure 124/62, pulse 69, temperature (!) 97.3 F (36.3 C), resp. rate 17, height 6' (1.829 m), weight 86.7 kg, SpO2 94 %.  PHYSICAL EXAMINATION:   GENERAL:  71 y.o.-year-old patient lying in the bed with no acute distress.  LUNGS: decreased breath sounds bilaterally, no wheezing CARDIOVASCULAR: S1, S2 normal. No murmur NSR ABDOMEN: Soft, nontender, nondistended. Bowel sounds present.  EXTREMITIES: No  edema b/l.    NEUROLOGIC: nonfocal  patient is alert and awake  LABORATORY PANEL:  CBC Recent Labs  Lab 05/18/22 0440  WBC 3.1*  HGB 15.8  HCT 49.0  PLT 128*     Chemistries  Recent Labs  Lab 05/22/22 0626  NA 141  K 4.4  CL 103  CO2 31  GLUCOSE 109*  BUN 35*  CREATININE 0.82  CALCIUM 9.4    Assessment and Plan 71 year old male with past medical history of ongoing tobacco abuse, COPD and hypertension brought to the emergency room on the early morning hours of 12/21 for cough, fever and progressively worsening shortness of breath times several days. The emergency room, patient noted to be significantly hypoxic and febrile and positive for influenza A. Patient also noted to be confused and ABG noted hypercapnia. Patient admitted to the hospitalist service and critical care consulted. Patient placed on BiPAP and overnight, mentation and hypercapnia improved.    Influenza A with pneumonia Patient presents for evaluation of altered mental status and weakness and his influenza A PCR is positive.   --Pro-calcitonin essentially normal.  Antibiotics discontinued.   --CT scan of chest has atelectasis versus infiltrate   Acute respiratory failure with hypoxia and hypercapnia (HCC) --Improved on BiPAP and able to titrate  down oxygen, currently on 3 L [place]. --wean down as tolerated   Atrial fibrillation with RVR ([place]) New onset.  Cardiology Dr Humphrey Rolls consulted.  Echocardiogram notes bilateral severe atrial dilatation.   Cont Amiodarone and BB  Started on anticoagulation--pt will need to f/u as out pt --currently in NSR   Acute metabolic encephalopathy Now at baseline--alert   COPD exacerbation (HCC) Acute exacerbation secondary to influenza A infection Continue bronchodilator therapy and inhaled steroids   HTN (hypertension) Cont BB   Controlled type 2 diabetes mellitus without complication, without long-term current use of insulin (HCC) --resumed metformin at 500 mg bid (pt was on 1000 mg bid)  Tobacco abuse Nicotine patch   Overweight (BMI 25.0-29.9) Meets criteria with BMI greater than 25   Acute on chronic diastolic CHF (congestive heart failure) (HCC) Echocardiogram notes grade 3 diastolic dysfunction.  Patient has responded well and has diuresed almost 13.2 L  --BNP on 12/26 within normal limits     Pressure ulcer: Stage II on sacrum, present on admission      Pressure Injury 05/14/22 Sacrum Mid Deep Tissue Pressure Injury - Purple or maroon localized area of discolored intact skin or blood-filled blister due to damage of underlying soft tissue from pressure and/or shear. (Active)  05/14/22 2030  Location: Sacrum  Location Orientation: Mid  Staging: Deep Tissue Pressure Injury - Purple or maroon localized area of discolored intact skin or blood-filled blister due to damage of underlying soft tissue from pressure and/or shear.  Wound Description (Comments):   Present on Admission: Yes  Dressing Type Foam - Lift dressing to assess site every shift 05/21/22 1600      Family communication :spoke with wife Consults :PCCM, cardiology CODE STATUS: FULL DVT Prophylaxis :eliquis Level of care: Telemetry Medical Status is: Inpatient Remains inpatient appropriate because: D/c planning to  Rehab when bed available  TOTAL TIME TAKING CARE OF THIS PATIENT: 35 minutes.  >50% time spent on counselling and coordination of care  Note: This dictation was prepared with Dragon dictation along with smaller phrase technology. Any transcriptional errors that result from this process are unintentional.  [name] M.D    [facility]   CC: Primary care physician; Frazier Richards, MD

## 2022-05-25 DIAGNOSIS — J09X1 Influenza due to identified novel influenza A virus with pneumonia: Secondary | ICD-10-CM | POA: Diagnosis not present

## 2022-05-25 LAB — GLUCOSE, CAPILLARY: Glucose-Capillary: 89 mg/dL (ref 70–99)

## 2022-05-25 MED ORDER — METOPROLOL TARTRATE 25 MG PO TABS: 12.5000 mg | ORAL_TABLET | Freq: Two times a day (BID) | ORAL | 1 refills | Status: DC

## 2022-05-25 MED ORDER — FUROSEMIDE 20 MG PO TABS: 20.0000 mg | ORAL_TABLET | Freq: Every day | ORAL | 0 refills | Status: DC

## 2022-05-25 MED ORDER — APIXABAN 5 MG PO TABS: 5.0000 mg | ORAL_TABLET | Freq: Two times a day (BID) | ORAL | 1 refills | Status: DC

## 2022-05-25 MED ORDER — AMIODARONE HCL 200 MG PO TABS: 200.0000 mg | ORAL_TABLET | Freq: Every day | ORAL | 1 refills | Status: DC

## 2022-05-25 MED ORDER — NICOTINE 21 MG/24HR TD PT24: 21.0000 mg | MEDICATED_PATCH | Freq: Every day | TRANSDERMAL | 0 refills | Status: DC

## 2022-05-25 NOTE — Discharge Instructions (Signed)
Use your oxygen as instructed. Usual bronchodilators and nebulizer.

## 2022-05-25 NOTE — Plan of Care (Signed)
  Problem: Coping: Goal: Ability to adjust to condition or change in health will improve Outcome: Progressing   Problem: Health Behavior/Discharge Planning: Goal: Ability to identify and utilize available resources and services will improve Outcome: Progressing   Problem: Metabolic: Goal: Ability to maintain appropriate glucose levels will improve Outcome: Progressing   Problem: Tissue Perfusion: Goal: Adequacy of tissue perfusion will improve Outcome: Progressing   Problem: Education: Goal: Knowledge of General Education information will improve Description: Including pain rating scale, medication(s)/side effects and non-pharmacologic comfort measures Outcome: Progressing   Problem: Health Behavior/Discharge Planning: Goal: Ability to manage health-related needs will improve Outcome: Progressing   Problem: Nutrition: Goal: Adequate nutrition will be maintained Outcome: Progressing

## 2022-05-25 NOTE — Care Management Important Message (Signed)
Important Message  Patient Details  Name: Christopher Mendez MRN: [id number] Date of Birth: 07/28/1950   Medicare Important Message Given:  Yes  Reviewed Medicare IM with Jeni Salles, daughter, via room phone [phone]).  Copy of Medicare IM sent securely via [place].    Dannette Barbara 05/25/2022, 11:26 AM

## 2022-05-25 NOTE — Progress Notes (Addendum)
Met with the patient to discuss needs and DC plan He will be going to his daughters to stay in [place] He will need )2 at 3 likters A RW and 3 in1 I reached out to Travis Sells with [employer] To be delivered to the Bedside prior to DC He is agreeable to HH PT and OT as long as it is okay with his daughter He is agreeable to set up and then can cancel if needed, he does not have a preference of companies I reached out to Cheryl with [employer] and inquired if they can take a patient in [place] Awaiting a response His daughter will pick up the patient for tranport 

## 2022-05-25 NOTE — Plan of Care (Signed)
  Problem: Clinical Measurements: Goal: Ability to maintain clinical measurements within normal limits will improve Outcome: Progressing Goal: Diagnostic test results will improve Outcome: Progressing Goal: Respiratory complications will improve Outcome: Progressing Goal: Cardiovascular complication will be avoided Outcome: Progressing   Problem: Activity: Goal: Risk for activity intolerance will decrease Outcome: Progressing   Problem: Nutrition: Goal: Adequate nutrition will be maintained Outcome: Progressing   Problem: Elimination: Goal: Will not experience complications related to bowel motility Outcome: Progressing Goal: Will not experience complications related to urinary retention Outcome: Progressing

## 2022-05-25 NOTE — Progress Notes (Signed)
Physical Therapy Treatment Patient Details Name: Christopher Mendez MRN: [id number] DOB: 1950/09/01 Today's Date: 05/25/2022   History of Present Illness 72 y/o male presented to ED on 05/14/22 for respiratory distress with hypoxia and fall. Found to have influenza A with pneumonia and acute metabolic encephalopathy. PMH: COPD, HTN    PT Comments    Pt was long sitting in bed upon arriving on 3 L o2 with sao2 92%. He is A and O and agreeable to session. Eagerly wanting to get OOB and ambulate. Pt did quickly desaturate to 72% without O2 donned. Required 5 L o2 [place] to recover however once recovered was able to wean back to 3 L with sao2 maintaining >88%. Pt strength is going well but his activity tolerance is poor. Discussed DC disposition with MD and pt's daughter. Pt will be Dcing home to daughters house and will have 24/7 assistance available if needed. Recommend DC home with HHPT to follow to maximize independence while assisting pt to PLOF.     Recommendations for follow up therapy are one component of a multi-disciplinary discharge planning process, led by the attending physician.  Recommendations may be updated based on patient status, additional functional criteria and insurance authorization.  Follow Up Recommendations  Home health PT     Assistance Recommended at Discharge Intermittent Supervision/Assistance  Patient can return home with the following A little help with walking and/or transfers;A little help with bathing/dressing/bathroom;Assistance with cooking/housework;Help with stairs or ramp for entrance;Assist for transportation;Direct supervision/assist for financial management;Direct supervision/assist for medications management   Equipment Recommendations  BSC/3in1;Rolling walker (2 wheels);Other (comment) (home o2)    Recommendations for Other Services       Precautions / Restrictions Precautions Precautions: Fall Precaution Comments: watch O2 Restrictions Weight  Bearing Restrictions: No     Mobility  Bed Mobility Overal bed mobility: Modified Independent Bed Mobility: Supine to Sit  Supine to sit: Supervision  General bed mobility comments: no physical assistance required to exit bed    Transfers Overall transfer level: Modified independent Equipment used: Rolling walker (2 wheels) Transfers: Sit to/from Stand Sit to Stand: Supervision    General transfer comment: pt stood from EOB and from recliner without physical assistance. did require Vcs for handplacement and increased fwd wt shift. Pt was on 3 L o2 with sao2 92% upon arrival. Desaturates to 72 %without O2 on    Ambulation/Gait Ambulation/Gait assistance: Supervision Gait Distance (Feet): 40 Feet Assistive device: Rolling walker (2 wheels) Gait Pattern/deviations: Step-through pattern Gait velocity: decreased     General Gait Details: pt ambulated 1 x 15 and then 1 x 40. gets SOB with minimal activity. Reached out to pt's daughter to discuss rehab versus home. Both pt and daughter feel home at DC is best. MD aware of need of O2 RW and BSC    Balance Overall balance assessment: Needs assistance Sitting-balance support: No upper extremity supported, Feet supported Sitting balance-Leahy Scale: Good     Standing balance support: Bilateral upper extremity supported, Reliant on assistive device for balance Standing balance-Leahy Scale: Good Standing balance comment: pt was able to ambulate in BR without use of RW. Author recommended to pt use RW at all times at DC until strength/endurance improves       Cognition Arousal/Alertness: Awake/alert Behavior During Therapy: WFL for tasks assessed/performed Overall Cognitive Status: Within Functional Limits for tasks assessed    General Comments: Pt is A and O x 4 and very cooperative motivated.  General Comments General comments (skin integrity, edema, etc.): pt desaturates to 72% on rm air. applied 5 L to recover but  able to wean back to 3 L by the end of session      Pertinent Vitals/Pain Pain Assessment Pain Assessment: No/denies pain     PT Goals (current goals can now be found in the care plan section) Acute Rehab PT Goals Patient Stated Goal: go home Progress towards PT goals: Progressing toward goals    Frequency    Min 2X/week      PT Plan Current plan remains appropriate       AM-PAC PT "6 Clicks" Mobility   Outcome Measure  Help needed turning from your back to your side while in a flat bed without using bedrails?: A Little Help needed moving from lying on your back to sitting on the side of a flat bed without using bedrails?: A Little Help needed moving to and from a bed to a chair (including a wheelchair)?: A Little Help needed standing up from a chair using your arms (e.g., wheelchair or bedside chair)?: A Little Help needed to walk in hospital room?: A Little Help needed climbing 3-5 steps with a railing? : A Little 6 Click Score: 18    End of Session Equipment Utilized During Treatment: Oxygen Activity Tolerance: Patient tolerated treatment well;Patient limited by fatigue Patient left: in chair;with call bell/phone within reach;Other (comment) Nurse Communication: Mobility status PT Visit Diagnosis: Unsteadiness on feet (R26.81);Muscle weakness (generalized) (M62.81);History of falling (Z91.81);Difficulty in walking, not elsewhere classified (R26.2)     Time: 9357-0177 PT Time Calculation (min) (ACUTE ONLY): 27 min  Charges:  $Gait Training: 8-22 mins $Therapeutic Activity: 8-22 mins                     Julaine Fusi PTA 05/25/22, 10:01 AM

## 2022-05-25 NOTE — Discharge Summary (Signed)
Physician Discharge Summary   Patient: Christopher Mendez MRN: [id number] DOB: [birth year]  Admit date:     05/13/2022  Discharge date: 05/25/22  Discharge Physician: [name]   PCP: Frazier Richards, MD   Recommendations at discharge:   usurer oxygen, inhalers, nebulizers before. Follow-up PCP in 1 to 2 weeks follow-up cardiology Dr. Yancey Flemings for a fib follow-up.  Discharge Diagnoses: Principal Problem:   Influenza A with pneumonia Active Problems:   Acute respiratory failure with hypoxia and hypercapnia (HCC)   Atrial fibrillation with RVR (HCC)   Acute metabolic encephalopathy   COPD exacerbation (HCC)   HTN (hypertension)   Controlled type 2 diabetes mellitus without complication, without long-term current use of insulin (HCC)   Tobacco abuse   Overweight (BMI 25.0-29.9)   Acute on chronic diastolic CHF (congestive heart failure) (HCC)  Assessment and Plan 72 year old male with past medical history of ongoing tobacco abuse, COPD and hypertension brought to the emergency room on the early morning hours of 12/21 for cough, fever and progressively worsening shortness of breath times several days. The emergency room, patient noted to be significantly hypoxic and febrile and positive for influenza A. Patient also noted to be confused and ABG noted hypercapnia. Patient placed on BiPAP and overnight, mentation and hypercapnia improved.     Influenza A with pneumonia Patient presents for evaluation of altered mental status and weakness and his influenza A PCR is positive.   --Pro-calcitonin essentially normal.  Antibiotics discontinued.   --CT scan of chest has atelectasis versus infiltrate   Acute respiratory failure with hypoxia and hypercapnia (HCC) --Improved on BiPAP and able to titrate down oxygen, currently on 3 L [place]. --wean down as tolerated -- patient did qualify for home oxygen.   Atrial fibrillation with RVR ([place]) New onset.  Cardiology Dr Humphrey Rolls consulted.  Echocardiogram  notes bilateral severe atrial dilatation.   Cont Amiodarone and BB  Started on anticoagulation--pt will need to f/u as out pt --currently in NSR   Acute metabolic encephalopathy Now at baseline--alert   COPD exacerbation (HCC) Acute exacerbation secondary to influenza A infection Continue bronchodilator therapy and inhaled steroids   HTN (hypertension) Cont BB holding amlodipine, hydrochlorothiazide and lisinopril-- since blood pressure is stable with BB -- for to PCP to add/resume additional BP meds if needed as outpatient   Controlled type 2 diabetes mellitus without complication, without long-term current use of insulin (HCC) --resumed metformin    Tobacco abuse Nicotine patch   Overweight (BMI 25.0-29.9) Meets criteria with BMI greater than 25   Acute on chronic diastolic CHF (congestive heart failure) (HCC) Echocardiogram notes grade 3 diastolic dysfunction.  Patient has responded well and has diuresed almost 13.2 L  --BNP on 12/26 within normal limits     Pressure ulcer: Stage II on sacrum, present on admission          Pressure Injury 05/14/22 Sacrum Mid Deep Tissue Pressure Injury - Purple or maroon localized area of discolored intact skin or blood-filled blister due to damage of underlying soft tissue from pressure and/or shear. (Active)  05/14/22 2030  Location: Sacrum  Location Orientation: Mid  Staging: Deep Tissue Pressure Injury - Purple or maroon localized area of discolored intact skin or blood-filled blister due to damage of underlying soft tissue from pressure and/or shear.  Wound Description (Comments):   Present on Admission: Yes  Dressing Type Foam - Lift dressing to assess site every shift 05/21/22 1600      Family communication :spoke with wife  Consults :PCCM, cardiology CODE STATUS: FULL DVT Prophylaxis :eliquis  Patient was reevaluated by physical therapy overall did well. Patient requested and was eager to go home. Home health PT has been  arranged. Equipment provided. Discharge plan was discussed with patient's daughter Ivin Booty long she is in agreement. Patient will be staying with her.   Disposition: Home health Diet recommendation:  Discharge Diet Orders (From admission, onward)     Start     Ordered   05/25/22 0000  Diet - low sodium heart healthy        05/25/22 1027           Cardiac and Carb modified diet DISCHARGE MEDICATION: Allergies as of 05/25/2022       Reactions   Flomax [tamsulosin Hcl] Rash   Myrbetriq [mirabegron] Rash        Medication List     STOP taking these medications    amLODipine 10 MG tablet Commonly known as: NORVASC   aspirin-acetaminophen-caffeine 250-250-65 MG tablet Commonly known as: EXCEDRIN MIGRAINE   hydrochlorothiazide 25 MG tablet Commonly known as: HYDRODIURIL   lisinopril 40 MG tablet Commonly known as: ZESTRIL   rosuvastatin 20 MG tablet Commonly known as: CRESTOR       TAKE these medications    albuterol 108 (90 Base) MCG/ACT inhaler Commonly known as: VENTOLIN HFA Inhale 2 puffs into the lungs every 6 (six) hours as needed for wheezing or shortness of breath.   amiodarone 200 MG tablet Commonly known as: PACERONE Take 1 tablet (200 mg total) by mouth daily. Start taking on: May 26, 2022   apixaban 5 MG Tabs tablet Commonly known as: ELIQUIS Take 1 tablet (5 mg total) by mouth 2 (two) times daily.   cholecalciferol 25 MCG (1000 UNIT) tablet Commonly known as: VITAMIN D3 Take 1,000 Units by mouth daily.   Fluticasone-Salmeterol 500-50 MCG/DOSE Aepb Commonly known as: ADVAIR Inhale 1 puff into the lungs 2 (two) times daily.   furosemide 20 MG tablet Commonly known as: LASIX Take 1 tablet (20 mg total) by mouth daily. Start taking on: May 26, 2022   ipratropium-albuterol 0.5-2.5 (3) MG/3ML Soln Commonly known as: DUONEB Take 3 mLs by nebulization every 6 (six) hours as needed.   Melatonin 10 MG Caps Take 10 mg by mouth at bedtime  as needed.   metFORMIN 1000 MG tablet Commonly known as: GLUCOPHAGE Take 1,000 mg by mouth 2 (two) times daily.   metoprolol tartrate 25 MG tablet Commonly known as: LOPRESSOR Take 0.5 tablets (12.5 mg total) by mouth 2 (two) times daily.   nicotine 21 mg/24hr patch Commonly known as: NICODERM CQ - dosed in mg/24 hours Place 1 patch (21 mg total) onto the skin daily. Start taking on: May 26, 2022   omeprazole 20 MG capsule Commonly known as: PRILOSEC Take 20 mg by mouth daily.   tiotropium 18 MCG inhalation capsule Commonly known as: SPIRIVA Place 18 mcg into inhaler and inhale daily.   vitamin C 1000 MG tablet Take 1,000 mg by mouth daily.               Durable Medical Equipment  (From admission, onward)           Start     Ordered   05/25/22 0952  For home use only DME Walker rolling  Once       Question Answer Comment  Walker: With 5 Inch Wheels   Patient needs a walker to treat with the following condition COPD exacerbation (  [place])      05/25/22 0951   05/25/22 0952  For home use only DME Bedside commode  Once       Question:  Patient needs a bedside commode to treat with the following condition  Answer:  General weakness   05/25/22 0951   05/25/22 0950  For home use only DME oxygen  Once       Question Answer Comment  Length of Need 6 Months   Mode or (Route) Nasal cannula   Liters per Minute 2   Frequency Continuous (stationary and portable oxygen unit needed)   Oxygen conserving device Yes   Oxygen delivery system Gas      05/25/22 0950              Discharge Care Instructions  (From admission, onward)           Start     Ordered   05/25/22 0000  Discharge wound care:       Comments: Foam pad dressing on sacrum ulcer prn   05/25/22 1027            Follow-up Information     Frazier Richards, MD. Schedule an appointment as soon as possible for a visit in 1 week(s).   Specialty: Family Medicine Why: Hospital  follow-up Contact information: [place] [address] [phone]         Dionisio David, MD. Go in 1 week(s).   Specialty: Cardiology Why: for afib f/u Contact information: [place] [address] [phone]                Discharge Exam: Filed Weights   05/14/22 0012 05/14/22 2037 05/14/22 2100  Weight: 92.2 kg 86.7 kg 86.7 kg  GENERAL:  71 y.o.-year-old patient lying in the bed with no acute distress.  LUNGS: decreased breath sounds bilaterally, no wheezing CARDIOVASCULAR: S1, S2 normal. No murmur NSR ABDOMEN: Soft, nontender, nondistended. Bowel sounds present.  EXTREMITIES: No  edema b/l.    NEUROLOGIC: nonfocal  patient is alert and awake   Condition at discharge: fair  The results of significant diagnostics from this hospitalization (including imaging, microbiology, ancillary and laboratory) are listed below for reference.   Imaging Studies: ECHOCARDIOGRAM COMPLETE  Result Date: 05/17/2022    ECHOCARDIOGRAM REPORT   Patient Name:   TERESA LEMMERMAN Date of Exam: 05/17/2022 Medical Rec #:  [id number]        Height:       72.0 in Accession #:    [id number]       Weight:       191.1 lb Date of Birth:  [birth year]        BSA:          2.090 m Patient Age:    15 years         BP:           132/31 mmHg Patient Gender: M                HR:           69 bpm. Exam Location:  [facility] Procedure: 2D Echo, Color Doppler, Cardiac Doppler and Intracardiac            Opacification Agent Indications:     I50.31 congestive heart failure-Acute Diastolic  History:         Patient has no prior history of Echocardiogram examinations.  COPD; Risk Factors:Hypertension.  Sonographer:     Charmayne Sheer Referring Phys:  [place] Diagnosing Phys: Neoma Laming  Sonographer Comments: Suboptimal apical window. IMPRESSIONS  1. Left ventricular ejection fraction, by estimation, is 25 to 30%. The left ventricle has severely decreased function.  The left ventricle demonstrates global hypokinesis. The left ventricular internal cavity size was severely dilated. Left ventricular diastolic parameters are consistent with Grade III diastolic dysfunction (restrictive).  2. Right ventricular systolic function is normal. The right ventricular size is normal.  3. Left atrial size was severely dilated.  4. Right atrial size was moderately dilated.  5. The mitral valve is normal in structure. Trivial mitral valve regurgitation. No evidence of mitral stenosis.  6. The aortic valve is calcified. Aortic valve regurgitation is not visualized. Aortic valve sclerosis/calcification is present, without any evidence of aortic stenosis.  7. The inferior vena cava is normal in size with greater than 50% respiratory variability, suggesting right atrial pressure of 3 mmHg. Conclusion(s)/Recommendation(s): Findings consistent with dilated cardiomyopathy. FINDINGS  Left Ventricle: Left ventricular ejection fraction, by estimation, is 25 to 30%. The left ventricle has severely decreased function. The left ventricle demonstrates global hypokinesis. Definity contrast agent was given IV to delineate the left ventricular endocardial borders. The left ventricular internal cavity size was severely dilated. There is no left ventricular hypertrophy. Left ventricular diastolic parameters are consistent with Grade III diastolic dysfunction (restrictive). Right Ventricle: The right ventricular size is normal. No increase in right ventricular wall thickness. Right ventricular systolic function is normal. Left Atrium: Left atrial size was severely dilated. Right Atrium: Right atrial size was moderately dilated. Pericardium: There is no evidence of pericardial effusion. Mitral Valve: The mitral valve is normal in structure. Trivial mitral valve regurgitation. No evidence of mitral valve stenosis. Tricuspid Valve: The tricuspid valve is normal in structure. Tricuspid valve regurgitation is mild . No  evidence of tricuspid stenosis. Aortic Valve: The aortic valve is calcified. Aortic valve regurgitation is not visualized. Aortic valve sclerosis/calcification is present, without any evidence of aortic stenosis. Aortic valve mean gradient measures 4.0 mmHg. Aortic valve peak gradient measures 8.8 mmHg. Aortic valve area, by VTI measures 3.20 cm. Pulmonic Valve: The pulmonic valve was normal in structure. Pulmonic valve regurgitation is not visualized. No evidence of pulmonic stenosis. Aorta: The aortic root is normal in size and structure. Venous: The inferior vena cava is normal in size with greater than 50% respiratory variability, suggesting right atrial pressure of 3 mmHg. IAS/Shunts: No atrial level shunt detected by color flow Doppler.  LEFT VENTRICLE PLAX 2D LVIDd:         5.00 cm   Diastology LVIDs:         4.30 cm   LV e' medial:    6.74 cm/s LV PW:         1.10 cm   LV E/e' medial:  7.9 LV IVS:        0.90 cm   LV e' lateral:   4.24 cm/s LVOT diam:     2.30 cm   LV E/e' lateral: 12.5 LV SV:         86 LV SV Index:   41 LVOT Area:     4.15 cm  RIGHT VENTRICLE RV Basal diam:  3.60 cm RV S prime:     12.20 cm/s LEFT ATRIUM           Index        RIGHT ATRIUM  Index LA diam:      4.10 cm 1.96 cm/m   RA Area:     21.70 cm LA Vol (A4C): 45.2 ml 21.63 ml/m  RA Volume:   70.80 ml  33.88 ml/m  AORTIC VALVE                    PULMONIC VALVE AV Area (Vmax):    2.98 cm     PV Vmax:       1.30 m/s AV Area (Vmean):   3.23 cm     PV Vmean:      85.700 cm/s AV Area (VTI):     3.20 cm     PV VTI:        0.224 m AV Vmax:           148.00 cm/s  PV Peak grad:  6.8 mmHg AV Vmean:          86.200 cm/s  PV Mean grad:  3.0 mmHg AV VTI:            0.269 m AV Peak Grad:      8.8 mmHg AV Mean Grad:      4.0 mmHg LVOT Vmax:         106.00 cm/s LVOT Vmean:        67.100 cm/s LVOT VTI:          0.207 m LVOT/AV VTI ratio: 0.77  AORTA Ao Root diam: 3.90 cm MITRAL VALVE MV Area (PHT): 3.96 cm     SHUNTS MV Decel Time:  192 msec     Systemic VTI:  0.21 m MV E velocity: 53.20 cm/s   Systemic Diam: 2.30 cm MV A velocity: 110.50 cm/s MV E/A ratio:  0.48 Shaukat Khan Electronically signed by Neoma Laming Signature Date/Time: 05/17/2022/12:34:29 PM    Final    DG Chest Port 1 View  Result Date: 05/16/2022 CLINICAL DATA:  Acute respiratory failure.  Hypoxia. EXAM: PORTABLE CHEST 1 VIEW COMPARISON:  May 14, 2022 FINDINGS: Cardiomegaly and increased interstitial opacity, worsened in the interval. No pneumothorax. Mild opacity in left base is stable, probably atelectasis. Infiltrate considered less likely. No other acute abnormalities. IMPRESSION: 1. Cardiomegaly and pulmonary edema. 2. Mild opacity in left base is favored to represent atelectasis. Infiltrate considered less likely. Electronically Signed   By: Dorise Bullion III M.D.   On: 05/16/2022 10:34   CT CHEST WO CONTRAST  Result Date: 05/14/2022 CLINICAL DATA:  Productive cough. EXAM: CT CHEST WITHOUT CONTRAST TECHNIQUE: Multidetector CT imaging of the chest was performed following the standard protocol without IV contrast. RADIATION DOSE REDUCTION: This exam was performed according to the departmental dose-optimization program which includes automated exposure control, adjustment of the mA and/or kV according to patient size and/or use of iterative reconstruction technique. COMPARISON:  Radiograph of same day. FINDINGS: Cardiovascular: Atherosclerosis of thoracic aorta is noted without aneurysm formation. Mild cardiomegaly. No pericardial effusion. Extensive coronary artery calcifications are noted. Mediastinum/Nodes: No enlarged mediastinal or axillary lymph nodes. Thyroid gland, trachea, and esophagus demonstrate no significant findings. Lungs/Pleura: No pneumothorax or pleural effusion is noted. Mild right posterior basilar subsegmental atelectasis or infiltrate is noted. Upper Abdomen: No acute abnormality. Musculoskeletal: No chest wall mass or suspicious bone  lesions identified. IMPRESSION: Mild right posterior basilar subsegmental atelectasis or infiltrate is noted. Extensive coronary artery calcifications are noted suggesting coronary disease. Aortic Atherosclerosis (ICD10-I70.0). Electronically Signed   By: Marijo Conception M.D.   On: 05/14/2022 09:37   CT  Head Wo Contrast  Result Date: 05/14/2022 CLINICAL DATA:  Unwitnessed fall altered mental status EXAM: CT HEAD WITHOUT CONTRAST TECHNIQUE: Contiguous axial images were obtained from the base of the skull through the vertex without intravenous contrast. RADIATION DOSE REDUCTION: This exam was performed according to the departmental dose-optimization program which includes automated exposure control, adjustment of the mA and/or kV according to patient size and/or use of iterative reconstruction technique. COMPARISON:  None Available. FINDINGS: Brain: No evidence of acute infarction, hemorrhage, mass, mass effect, or midline shift. No hydrocephalus or extra-axial fluid collection. Periventricular white matter changes, likely the sequela of chronic small vessel ischemic disease. Vascular: No hyperdense vessel. Atherosclerotic calcifications in the intracranial carotid and vertebral arteries. Skull: Normal. Negative for fracture or focal lesion. Sinuses/Orbits: Minimal mucosal thickening in the ethmoid air cells. Status post bilateral lens replacements. Other: The mastoid air cells are well aerated. IMPRESSION: No acute intracranial process. Electronically Signed   By: Merilyn Baba M.D.   On: 05/14/2022 02:10   DG Chest Port 1 View  Result Date: 05/14/2022 CLINICAL DATA:  Questionable sepsis EXAM: PORTABLE CHEST 1 VIEW COMPARISON:  Chest x-ray 07/05/2017 FINDINGS: There is central pulmonary vascular congestion. There is no lung consolidation, pleural effusion or pneumothorax. There is minimal atelectasis in the left lung base. Cardiomediastinal silhouette is within normal limits. No acute fractures are seen.  IMPRESSION: Central pulmonary vascular congestion. No lung consolidation. Electronically Signed   By: Ronney Asters M.D.   On: 05/14/2022 00:37    Microbiology: Results for orders placed or performed during the hospital encounter of 05/13/22  Blood Culture (routine x 2)     Status: None   Collection Time: 05/14/22 12:01 AM   Specimen: BLOOD  Result Value Ref Range Status   Specimen Description BLOOD LEFT [facility]  Final   Special Requests   Final    BOTTLES DRAWN AEROBIC AND ANAEROBIC Blood Culture adequate volume   Culture   Final    NO GROWTH 5 DAYS Performed at [facility], [address] [place], [place] [address]    Report Status 05/19/2022 FINAL  Final  Resp panel by RT-PCR (RSV, Flu A&B, Covid) Anterior Nasal Swab     Status: Abnormal   Collection Time: 05/14/22 12:02 AM   Specimen: Anterior Nasal Swab  Result Value Ref Range Status   SARS Coronavirus 2 by RT PCR NEGATIVE NEGATIVE Final    Comment: (NOTE) SARS-CoV-2 target nucleic acids are NOT DETECTED.  The SARS-CoV-2 RNA is generally detectable in upper respiratory specimens during the acute phase of infection. The lowest concentration of SARS-CoV-2 viral copies this assay can detect is 138 copies/mL. A negative result does not preclude SARS-Cov-2 infection and should not be used as the sole basis for treatment or other patient management decisions. A negative result may occur with  improper specimen collection/handling, submission of specimen other than nasopharyngeal swab, presence of viral mutation(s) within the areas targeted by this assay, and inadequate number of viral copies(<138 copies/mL). A negative result must be combined with clinical observations, patient history, and epidemiological information. The expected result is Negative.  Fact Sheet for Patients:  EntrepreneurPulse.com.au  Fact Sheet for Healthcare Providers:  IncredibleEmployment.be  This test is no t  yet approved or cleared by the [place] FDA and  has been authorized for detection and/or diagnosis of SARS-CoV-2 by FDA under an Emergency Use Authorization (EUA). This EUA will remain  in effect (meaning this test can be used) for the duration of the  COVID-19 declaration under Section 564(b)(1) of the Act, 21 U.S.C.section 360bbb-3(b)(1), unless the authorization is terminated  or revoked sooner.       Influenza A by PCR POSITIVE (A) NEGATIVE Final   Influenza B by PCR NEGATIVE NEGATIVE Final    Comment: (NOTE) The Xpert Xpress SARS-CoV-2/FLU/RSV plus assay is intended as an aid in the diagnosis of influenza from Nasopharyngeal swab specimens and should not be used as a sole basis for treatment. Nasal washings and aspirates are unacceptable for Xpert Xpress SARS-CoV-2/FLU/RSV testing.  Fact Sheet for Patients: EntrepreneurPulse.com.au  Fact Sheet for Healthcare Providers: IncredibleEmployment.be  This test is not yet approved or cleared by the [place] FDA and has been authorized for detection and/or diagnosis of SARS-CoV-2 by FDA under an Emergency Use Authorization (EUA). This EUA will remain in effect (meaning this test can be used) for the duration of the COVID-19 declaration under Section 564(b)(1) of the Act, 21 U.S.C. section 360bbb-3(b)(1), unless the authorization is terminated or revoked.     Resp Syncytial Virus by PCR NEGATIVE NEGATIVE Final    Comment: (NOTE) Fact Sheet for Patients: EntrepreneurPulse.com.au  Fact Sheet for Healthcare Providers: IncredibleEmployment.be  This test is not yet approved or cleared by the [place] FDA and has been authorized for detection and/or diagnosis of SARS-CoV-2 by FDA under an Emergency Use Authorization (EUA). This EUA will remain in effect (meaning this test can be used) for the duration of the COVID-19 declaration under Section  564(b)(1) of the Act, 21 U.S.C. section 360bbb-3(b)(1), unless the authorization is terminated or revoked.  Performed at [facility], [address] [place], [place] [address]   Blood Culture (routine x 2)     Status: None   Collection Time: 05/14/22 12:02 AM   Specimen: BLOOD  Result Value Ref Range Status   Specimen Description BLOOD RIGHT [facility]  Final   Special Requests   Final    BOTTLES DRAWN AEROBIC AND ANAEROBIC Blood Culture results may not be optimal due to an excessive volume of blood received in culture bottles   Culture   Final    NO GROWTH 5 DAYS Performed at [facility], [address] [place], [place] [address]    Report Status 05/19/2022 FINAL  Final  Urine Culture     Status: Abnormal   Collection Time: 05/14/22 12:18 AM   Specimen: In/Out Cath Urine  Result Value Ref Range Status   Specimen Description   Final    IN/OUT CATH URINE Performed at [facility], [address], [place], [place] [address]    Special Requests   Final    NONE Performed at [facility], [address] [place], [place] [address]    Culture MULTIPLE SPECIES PRESENT, SUGGEST RECOLLECTION (A)  Final   Report Status 05/15/2022 FINAL  Final  MRSA Next Gen by PCR, Nasal     Status: None   Collection Time: 05/14/22  8:39 PM   Specimen: Nasal Mucosa; Nasal Swab  Result Value Ref Range Status   MRSA by PCR Next Gen NOT DETECTED NOT DETECTED Final    Comment: (NOTE) The GeneXpert MRSA Assay (FDA approved for NASAL specimens only), is one component of a comprehensive MRSA colonization surveillance program. It is not intended to diagnose MRSA infection nor to guide or monitor treatment for MRSA infections. Test performance is not FDA approved in patients less than 67 years old. Performed at [facility], [address], [place], [place] [address]  Labs: CBC: No results for input(s): "WBC", "NEUTROABS", "HGB", "HCT", "MCV", "PLT" in  the last 168 hours. Basic Metabolic Panel: Recent Labs  Lab 05/19/22 0609 05/20/22 0445 05/21/22 0644 05/22/22 0626  NA 139 138 139 141  K 4.2 4.5 4.3 4.4  CL 98 98 99 103  CO2 '30 30 30 31  '$ GLUCOSE 107* 107* 95 109*  BUN 26* 32* 48* 35*  CREATININE 0.65 0.90 0.91 0.82  CALCIUM 9.5 9.4 9.3 9.4   Liver Function Tests: No results for input(s): "AST", "ALT", "ALKPHOS", "BILITOT", "PROT", "ALBUMIN" in the last 168 hours. CBG: Recent Labs  Lab 05/24/22 0813 05/24/22 1139 05/24/22 1706 05/24/22 2115 05/25/22 0807  GLUCAP 115* 103* 111* 116* 89    Discharge time spent: greater than 30 minutes.  Signed: [name], MD [facility] 05/25/2022

## 2022-05-25 NOTE — Progress Notes (Signed)
Patient is not able to walk the distance required to go the bathroom, or he/she is unable to safely negotiate stairs required to access the bathroom.  A 3in1 BSC will alleviate this problem  

## 2022-06-29 ENCOUNTER — Other Ambulatory Visit: Payer: Self-pay | Admitting: Cardiovascular Disease

## 2022-06-29 DIAGNOSIS — R0602 Shortness of breath: Secondary | ICD-10-CM

## 2022-06-30 ENCOUNTER — Ambulatory Visit (INDEPENDENT_AMBULATORY_CARE_PROVIDER_SITE_OTHER): Payer: Medicare Other

## 2022-06-30 DIAGNOSIS — R0602 Shortness of breath: Secondary | ICD-10-CM | POA: Diagnosis not present

## 2022-06-30 MED ORDER — DIPYRIDAMOLE 5 MG/ML IV SOLN: 10.0000 mg | Freq: Once | INTRAVENOUS | Status: DC

## 2022-06-30 MED ORDER — TECHNETIUM TC 99M SESTAMIBI GENERIC - CARDIOLITE
33.5000 | Freq: Once | INTRAVENOUS | Status: AC | PRN
  Administered 2022-06-30: 33.5 via INTRAVENOUS

## 2022-06-30 MED ORDER — TECHNETIUM TC 99M SESTAMIBI GENERIC - CARDIOLITE
9.9000 | Freq: Once | INTRAVENOUS | Status: AC | PRN
  Administered 2022-06-30: 9.9 via INTRAVENOUS

## 2022-06-30 NOTE — Progress Notes (Unsigned)
Persantine given

## 2022-07-03 ENCOUNTER — Other Ambulatory Visit: Payer: Medicare Other

## 2022-07-07 ENCOUNTER — Encounter: Payer: Self-pay | Admitting: Cardiovascular Disease

## 2022-07-07 ENCOUNTER — Ambulatory Visit (INDEPENDENT_AMBULATORY_CARE_PROVIDER_SITE_OTHER): Payer: Medicare Other | Admitting: Cardiovascular Disease

## 2022-07-07 VITALS — BP 150/78 | HR 57 | Ht 72.0 in | Wt 197.0 lb

## 2022-07-07 DIAGNOSIS — I10 Essential (primary) hypertension: Secondary | ICD-10-CM

## 2022-07-07 DIAGNOSIS — I4891 Unspecified atrial fibrillation: Secondary | ICD-10-CM

## 2022-07-07 DIAGNOSIS — R9430 Abnormal result of cardiovascular function study, unspecified: Secondary | ICD-10-CM

## 2022-07-07 DIAGNOSIS — R0789 Other chest pain: Secondary | ICD-10-CM

## 2022-07-07 DIAGNOSIS — I5033 Acute on chronic diastolic (congestive) heart failure: Secondary | ICD-10-CM

## 2022-07-07 NOTE — Assessment & Plan Note (Signed)
Heart rate controlled. Continue amiodarone, Eliquis, metoprolol.

## 2022-07-07 NOTE — Patient Instructions (Signed)
Take lopressor night prior to study and the morning of 90 minutes prior to study.

## 2022-07-07 NOTE — Progress Notes (Signed)
Cardiology Office Note   Date:  07/07/2022   ID:  [name], [name] 14-Apr-1951, MRN [id number]  PCP:  Christopher Richards, MD  Cardiologist:  Christopher Laming, MD      History of Present Illness: Christopher Mendez is a 72 y.o. male who presents for  Chief Complaint  Patient presents with   Follow-up    Follow Up    Patient in office to discuss results of stress test. Denies chest pain. Shortness of breath unchanged.   Shortness of Breath This is a chronic problem. The current episode started more than 1 year ago. The problem occurs constantly. The problem has been unchanged. Treatments tried: on 2 L O2. The treatment provided moderate relief. His past medical history is significant for chronic lung disease, COPD and a heart failure.      Past Medical History:  Diagnosis Date   Cardiac enlargement    COPD (chronic obstructive pulmonary disease) (HCC)    HTN (hypertension)      Past Surgical History:  Procedure Laterality Date   AMPUTATION TOE Right 07/14/2019   Procedure: AMPUTATION TOE RAY RIGHT 5TH PARTIAL;  Surgeon: Caroline More, DPM;  Location: [facility] ORS;  Service: Podiatry;  Laterality: Right;   EYE SURGERY       Current Outpatient Medications  Medication Sig Dispense Refill   albuterol (VENTOLIN HFA) 108 (90 Base) MCG/ACT inhaler Inhale 2 puffs into the lungs every 6 (six) hours as needed for wheezing or shortness of breath.     amiodarone (PACERONE) 200 MG tablet Take 1 tablet (200 mg total) by mouth daily. 30 tablet 1   apixaban (ELIQUIS) 5 MG TABS tablet Take 1 tablet (5 mg total) by mouth 2 (two) times daily. 60 tablet 1   Ascorbic Acid (VITAMIN C) 1000 MG tablet Take 1,000 mg by mouth daily.     cholecalciferol (VITAMIN D3) 25 MCG (1000 UNIT) tablet Take 1,000 Units by mouth daily.     Fluticasone-Salmeterol (ADVAIR) 500-50 MCG/DOSE AEPB Inhale 1 puff into the lungs 2 (two) times daily.     furosemide (LASIX) 20 MG tablet Take 1 tablet (20 mg total) by mouth  daily. 30 tablet 0   ipratropium-albuterol (DUONEB) 0.5-2.5 (3) MG/3ML SOLN Take 3 mLs by nebulization every 6 (six) hours as needed.     Melatonin 10 MG CAPS Take 10 mg by mouth at bedtime as needed.     metFORMIN (GLUCOPHAGE) 1000 MG tablet Take 1,000 mg by mouth 2 (two) times daily.     metoprolol tartrate (LOPRESSOR) 25 MG tablet Take 0.5 tablets (12.5 mg total) by mouth 2 (two) times daily. 60 tablet 1   nicotine (NICODERM CQ - DOSED IN MG/24 HOURS) 21 mg/24hr patch Place 1 patch (21 mg total) onto the skin daily. 28 patch 0   omeprazole (PRILOSEC) 20 MG capsule Take 20 mg by mouth daily.     tiotropium (SPIRIVA) 18 MCG inhalation capsule Place 18 mcg into inhaler and inhale daily.     Current Facility-Administered Medications  Medication Dose Route Frequency Provider Last Rate Last Admin   dipyridamole (PERSANTINE) injection 10 mg  10 mg Intravenous Once Dionisio David, MD        Allergies:   Flomax [tamsulosin hcl] and Myrbetriq [mirabegron]    Social History:   reports that he has been smoking cigarettes. He has a 56.00 pack-year smoking history. He has never used smokeless tobacco. He reports current alcohol use of about 42.0 standard drinks of  alcohol per week. He reports that he does not use drugs.   Family History:  family history includes Diabetes in his father; Hypertension in his mother.    ROS:     Review of Systems  Constitutional: Negative.   HENT: Negative.    Eyes: Negative.   Respiratory:  Positive for shortness of breath.   Gastrointestinal: Negative.   Genitourinary: Negative.   Musculoskeletal: Negative.   Skin: Negative.   Neurological: Negative.   Endo/Heme/Allergies: Negative.   Psychiatric/Behavioral: Negative.    All other systems reviewed and are negative.   All other systems are reviewed and negative.    PHYSICAL EXAM: VS:  BP (!) 150/78   Pulse (!) 57   Ht 6' (1.829 m)   Wt 197 lb (89.4 kg)   SpO2 94% Comment: with 2 L oxygen  BMI 26.72  kg/m  , BMI Body mass index is 26.72 kg/m. Last weight:  Wt Readings from Last 3 Encounters:  07/07/22 197 lb (89.4 kg)  05/14/22 191 lb 2.2 oz (86.7 kg)  02/25/22 196 lb 8 oz (89.1 kg)     Physical Exam Vitals reviewed.  Constitutional:      Appearance: Normal appearance. He is normal weight.  HENT:     Head: Normocephalic.     Nose: Nose normal.     Mouth/Throat:     Mouth: Mucous membranes are moist.  Eyes:     Pupils: Pupils are equal, round, and reactive to light.  Cardiovascular:     Rate and Rhythm: Normal rate and regular rhythm.     Pulses: Normal pulses.     Heart sounds: Normal heart sounds.  Pulmonary:     Effort: Pulmonary effort is normal.  Abdominal:     General: Abdomen is flat. Bowel sounds are normal.  Musculoskeletal:        General: Normal range of motion.     Cervical back: Normal range of motion.  Skin:    General: Skin is warm.  Neurological:     General: No focal deficit present.     Mental Status: He is alert.  Psychiatric:        Mood and Affect: Mood normal.     EKG: none today  Recent Labs: 05/14/2022: ALT 9 05/16/2022: Magnesium 2.4 05/18/2022: Hemoglobin 15.8; Platelets 128 05/19/2022: B Natriuretic Peptide 78.2 05/22/2022: BUN 35; Creatinine, Ser 0.82; Potassium 4.4; Sodium 141    Lipid Panel No results found for: "CHOL", "TRIG", "HDL", "CHOLHDL", "VLDL", "LDLCALC", "LDLDIRECT"    Other studies Reviewed: stress test 06/30/2022   ASSESSMENT AND PLAN:    ICD-10-CM   1. Acute on chronic diastolic CHF (congestive heart failure) (HCC)  I50.33     2. Other chest pain  R07.89 CT CORONARY MORPH W/CTA COR W/SCORE W/CA W/CM &/OR WO/CM    3. Abnormal result of cardiovascular function study, unspecified  R94.30 CT CORONARY MORPH W/CTA COR W/SCORE W/CA W/CM &/OR WO/CM    4. Atrial fibrillation with RVR (HCC)  I48.91     5. Primary hypertension  I10        Problem List Items Addressed This Visit       Cardiovascular and  Mediastinum   HTN (hypertension)   Atrial fibrillation with RVR (HCC)    Heart rate controlled. Continue amiodarone, Eliquis, metoprolol.       Acute on chronic diastolic CHF (congestive heart failure) (HCC) - Primary    Stress test 06/2022 Ischemia in the RCA territory with normal LVEF, advise CCTA  or Cath. Will schedule patient for CCTA.       Other Visit Diagnoses     Other chest pain       Relevant Orders   CT CORONARY MORPH W/CTA COR W/SCORE W/CA W/CM &/OR WO/CM   Abnormal result of cardiovascular function study, unspecified       Relevant Orders   CT CORONARY MORPH W/CTA COR W/SCORE W/CA W/CM &/OR WO/CM          Disposition:   Return in about 3 weeks (around 07/28/2022).      Signed,  Christopher Laming, MD  07/07/2022 9:54 AM    [facility]

## 2022-07-07 NOTE — Assessment & Plan Note (Signed)
Stress test 06/2022 Ischemia in the RCA territory with normal LVEF, advise CCTA or Cath. Will schedule patient for CCTA.

## 2022-07-21 ENCOUNTER — Other Ambulatory Visit: Payer: Medicare Other

## 2022-07-23 ENCOUNTER — Other Ambulatory Visit: Payer: Self-pay | Admitting: Cardiovascular Disease

## 2022-07-27 ENCOUNTER — Ambulatory Visit (INDEPENDENT_AMBULATORY_CARE_PROVIDER_SITE_OTHER): Payer: Medicare Other

## 2022-07-27 DIAGNOSIS — R9430 Abnormal result of cardiovascular function study, unspecified: Secondary | ICD-10-CM | POA: Diagnosis not present

## 2022-07-27 DIAGNOSIS — R072 Precordial pain: Secondary | ICD-10-CM

## 2022-07-27 DIAGNOSIS — R0789 Other chest pain: Secondary | ICD-10-CM

## 2022-07-27 MED ORDER — IOHEXOL 350 MG/ML SOLN
100.0000 mL | Freq: Once | INTRAVENOUS | Status: AC | PRN
  Administered 2022-07-27: 100 mL via INTRAVENOUS

## 2022-07-28 ENCOUNTER — Encounter: Payer: Self-pay | Admitting: Cardiovascular Disease

## 2022-07-28 ENCOUNTER — Ambulatory Visit (INDEPENDENT_AMBULATORY_CARE_PROVIDER_SITE_OTHER): Payer: Medicare Other | Admitting: Cardiovascular Disease

## 2022-07-28 VITALS — BP 140/74 | HR 77 | Ht 72.0 in | Wt 206.2 lb

## 2022-07-28 DIAGNOSIS — I255 Ischemic cardiomyopathy: Secondary | ICD-10-CM | POA: Diagnosis not present

## 2022-07-28 DIAGNOSIS — I4891 Unspecified atrial fibrillation: Secondary | ICD-10-CM

## 2022-07-28 DIAGNOSIS — I2510 Atherosclerotic heart disease of native coronary artery without angina pectoris: Secondary | ICD-10-CM

## 2022-07-28 DIAGNOSIS — R0789 Other chest pain: Secondary | ICD-10-CM | POA: Insufficient documentation

## 2022-07-28 DIAGNOSIS — I5033 Acute on chronic diastolic (congestive) heart failure: Secondary | ICD-10-CM

## 2022-07-28 DIAGNOSIS — I10 Essential (primary) hypertension: Secondary | ICD-10-CM

## 2022-07-28 MED ORDER — METOPROLOL SUCCINATE ER 25 MG PO TB24: 25.0000 mg | ORAL_TABLET | Freq: Every day | ORAL | 11 refills | Status: DC

## 2022-07-28 MED ORDER — ROSUVASTATIN CALCIUM 40 MG PO TABS: 40.0000 mg | ORAL_TABLET | Freq: Every day | ORAL | 4 refills | Status: DC

## 2022-07-28 MED ORDER — RANOLAZINE ER 500 MG PO TB12: 500.0000 mg | ORAL_TABLET | Freq: Two times a day (BID) | ORAL | 4 refills | Status: DC

## 2022-07-28 MED ORDER — ENTRESTO 24-26 MG PO TABS: 1.0000 | ORAL_TABLET | Freq: Two times a day (BID) | ORAL | 4 refills | Status: DC

## 2022-07-28 NOTE — Assessment & Plan Note (Addendum)
CCTA revealed no significant blockages. Patient reports occasional chest pain, add Ranexa. Add high dose rosuvastatin.

## 2022-07-28 NOTE — Progress Notes (Signed)
Cardiology Office Note   Date:  07/28/2022   ID:  Fount, Fugere [birth year], MRN [id number]  PCP:  Frazier Richards, MD  Cardiologist:  Neoma Laming, MD      History of Present Illness: Christopher Mendez is a 72 y.o. male who presents for  Chief Complaint  Patient presents with   Follow-up    CT results    Patient in office to discuss results of CCTA. Patient complaining of occasional chest pain. Shortness of breath stable on 2 L O2.  Chest Pain  This is a new problem. The current episode started 1 to 4 weeks ago. The problem occurs intermittently. The problem has been waxing and waning. Associated symptoms include shortness of breath. The pain is aggravated by exertion. He has tried nothing for the symptoms.   Past Medical History:  Diagnosis Date   Cardiac enlargement    COPD (chronic obstructive pulmonary disease) (HCC)    HTN (hypertension)      Past Surgical History:  Procedure Laterality Date   AMPUTATION TOE Right 07/14/2019   Procedure: AMPUTATION TOE RAY RIGHT 5TH PARTIAL;  Surgeon: Caroline More, DPM;  Location: [facility] ORS;  Service: Podiatry;  Laterality: Right;   EYE SURGERY       Current Outpatient Medications  Medication Sig Dispense Refill   albuterol (VENTOLIN HFA) 108 (90 Base) MCG/ACT inhaler Inhale 2 puffs into the lungs every 6 (six) hours as needed for wheezing or shortness of breath.     amiodarone (PACERONE) 200 MG tablet Take 1 tablet (200 mg total) by mouth daily. 30 tablet 1   apixaban (ELIQUIS) 5 MG TABS tablet Take 1 tablet (5 mg total) by mouth 2 (two) times daily. 60 tablet 1   Ascorbic Acid (VITAMIN C) 1000 MG tablet Take 1,000 mg by mouth daily.     cholecalciferol (VITAMIN D3) 25 MCG (1000 UNIT) tablet Take 1,000 Units by mouth daily.     Fluticasone-Salmeterol (ADVAIR) 500-50 MCG/DOSE AEPB Inhale 1 puff into the lungs 2 (two) times daily.     ipratropium-albuterol (DUONEB) 0.5-2.5 (3) MG/3ML SOLN Take 3 mLs by nebulization every 6  (six) hours as needed.     Melatonin 10 MG CAPS Take 10 mg by mouth at bedtime as needed.     metFORMIN (GLUCOPHAGE) 1000 MG tablet Take 1,000 mg by mouth 2 (two) times daily.     metoprolol succinate (TOPROL XL) 25 MG 24 hr tablet Take 1 tablet (25 mg total) by mouth daily. 30 tablet 11   omeprazole (PRILOSEC) 20 MG capsule Take 20 mg by mouth daily.     ranolazine (RANEXA) 500 MG 12 hr tablet Take 1 tablet (500 mg total) by mouth 2 (two) times daily. 60 tablet 4   rosuvastatin (CRESTOR) 40 MG tablet Take 1 tablet (40 mg total) by mouth daily. 30 tablet 4   sacubitril-valsartan (ENTRESTO) 24-26 MG Take 1 tablet by mouth 2 (two) times daily. 60 tablet 4   tiotropium (SPIRIVA) 18 MCG inhalation capsule Place 18 mcg into inhaler and inhale daily.     Current Facility-Administered Medications  Medication Dose Route Frequency Provider Last Rate Last Admin   dipyridamole (PERSANTINE) injection 10 mg  10 mg Intravenous Once Dionisio David, MD        Allergies:   Flomax [tamsulosin hcl] and Myrbetriq [mirabegron]    Social History:   reports that he has been smoking cigarettes. He has a 56.00 pack-year smoking history. He has never used  smokeless tobacco. He reports current alcohol use of about 42.0 standard drinks of alcohol per week. He reports that he does not use drugs.   Family History:  family history includes Diabetes in his father; Hypertension in his mother.    ROS:     Review of Systems  Constitutional: Negative.   HENT: Negative.    Eyes: Negative.   Respiratory:  Positive for shortness of breath.   Cardiovascular:  Positive for chest pain.  Gastrointestinal: Negative.   Genitourinary: Negative.   Musculoskeletal: Negative.   Skin: Negative.   Neurological: Negative.   Endo/Heme/Allergies: Negative.   Psychiatric/Behavioral: Negative.    All other systems reviewed and are negative.   All other systems are reviewed and negative.   PHYSICAL EXAM: VS:  BP (!) 140/74    Pulse 77   Ht 6' (1.829 m)   Wt 206 lb 3.2 oz (93.5 kg)   SpO2 91%   BMI 27.97 kg/m  , BMI Body mass index is 27.97 kg/m. Last weight:  Wt Readings from Last 3 Encounters:  07/28/22 206 lb 3.2 oz (93.5 kg)  07/07/22 197 lb (89.4 kg)  05/14/22 191 lb 2.2 oz (86.7 kg)    Physical Exam Vitals reviewed.  Constitutional:      Appearance: Normal appearance. He is normal weight.  HENT:     Head: Normocephalic.     Nose: Nose normal.     Mouth/Throat:     Mouth: Mucous membranes are moist.  Eyes:     Pupils: Pupils are equal, round, and reactive to light.  Cardiovascular:     Rate and Rhythm: Normal rate and regular rhythm.     Pulses: Normal pulses.     Heart sounds: Normal heart sounds.  Pulmonary:     Effort: Pulmonary effort is normal.  Abdominal:     General: Abdomen is flat. Bowel sounds are normal.  Musculoskeletal:        General: Normal range of motion.     Cervical back: Normal range of motion.  Skin:    General: Skin is warm.  Neurological:     General: No focal deficit present.     Mental Status: He is alert.  Psychiatric:        Mood and Affect: Mood normal.     EKG: none today  Recent Labs: 05/14/2022: ALT 9 05/16/2022: Magnesium 2.4 05/18/2022: Hemoglobin 15.8; Platelets 128 05/19/2022: B Natriuretic Peptide 78.2 05/22/2022: BUN 35; Creatinine, Ser 0.82; Potassium 4.4; Sodium 141    Lipid Panel No results found for: "CHOL", "TRIG", "HDL", "CHOLHDL", "VLDL", "LDLCALC", "LDLDIRECT"    Other studies Reviewed: CCTA   ASSESSMENT AND PLAN:    ICD-10-CM   1. Ischemic cardiomyopathy  I25.5     2. Primary hypertension  I10     3. Atrial fibrillation with RVR (HCC)  I48.91 metoprolol succinate (TOPROL XL) 25 MG 24 hr tablet    4. Acute on chronic diastolic CHF (congestive heart failure) (HCC)  I50.33 sacubitril-valsartan (ENTRESTO) 24-26 MG    5. Other chest pain  R07.89 ranolazine (RANEXA) 500 MG 12 hr tablet    6. Coronary artery disease  involving native coronary artery of native heart without angina pectoris  I25.10        Problem List Items Addressed This Visit       Cardiovascular and Mediastinum   HTN (hypertension)   Relevant Medications   rosuvastatin (CRESTOR) 40 MG tablet   ranolazine (RANEXA) 500 MG 12 hr tablet   sacubitril-valsartan (  ENTRESTO) 24-26 MG   metoprolol succinate (TOPROL XL) 25 MG 24 hr tablet   Atrial fibrillation with RVR (HCC)   Relevant Medications   rosuvastatin (CRESTOR) 40 MG tablet   ranolazine (RANEXA) 500 MG 12 hr tablet   sacubitril-valsartan (ENTRESTO) 24-26 MG   metoprolol succinate (TOPROL XL) 25 MG 24 hr tablet   Acute on chronic diastolic CHF (congestive heart failure) (HCC)   Relevant Medications   rosuvastatin (CRESTOR) 40 MG tablet   ranolazine (RANEXA) 500 MG 12 hr tablet   sacubitril-valsartan (ENTRESTO) 24-26 MG   metoprolol succinate (TOPROL XL) 25 MG 24 hr tablet   Coronary artery disease involving native coronary artery of native heart without angina pectoris    CCTA revealed no significant blockages. Patient reports occasional chest pain, add Ranexa. Add high dose rosuvastatin.      Relevant Medications   rosuvastatin (CRESTOR) 40 MG tablet   ranolazine (RANEXA) 500 MG 12 hr tablet   sacubitril-valsartan (ENTRESTO) 24-26 MG   metoprolol succinate (TOPROL XL) 25 MG 24 hr tablet   Ischemic cardiomyopathy - Primary    Patient echo 05/17/22 revealed EF 25-30%. Add Entresto, change metoprolol tartrate to succinate.       Relevant Medications   rosuvastatin (CRESTOR) 40 MG tablet   ranolazine (RANEXA) 500 MG 12 hr tablet   sacubitril-valsartan (ENTRESTO) 24-26 MG   metoprolol succinate (TOPROL XL) 25 MG 24 hr tablet     Other   Other chest pain   Relevant Medications   ranolazine (RANEXA) 500 MG 12 hr tablet     Disposition:   Return in about 1 week (around 08/04/2022).    Total time spent: 30 minutes  Signed,  Neoma Laming, MD  07/28/2022 11:13 AM     [place]

## 2022-07-28 NOTE — Assessment & Plan Note (Signed)
Patient echo 05/17/22 revealed EF 25-30%. Add Entresto, change metoprolol tartrate to succinate.

## 2022-08-04 ENCOUNTER — Encounter: Payer: Self-pay | Admitting: Cardiovascular Disease

## 2022-08-04 ENCOUNTER — Ambulatory Visit (INDEPENDENT_AMBULATORY_CARE_PROVIDER_SITE_OTHER): Payer: Medicare Other | Admitting: Cardiovascular Disease

## 2022-08-04 VITALS — BP 120/70 | HR 70 | Ht 72.0 in | Wt 205.0 lb

## 2022-08-04 DIAGNOSIS — I10 Essential (primary) hypertension: Secondary | ICD-10-CM

## 2022-08-04 DIAGNOSIS — I5033 Acute on chronic diastolic (congestive) heart failure: Secondary | ICD-10-CM | POA: Diagnosis not present

## 2022-08-04 DIAGNOSIS — I255 Ischemic cardiomyopathy: Secondary | ICD-10-CM

## 2022-08-04 DIAGNOSIS — I4891 Unspecified atrial fibrillation: Secondary | ICD-10-CM

## 2022-08-04 DIAGNOSIS — R0789 Other chest pain: Secondary | ICD-10-CM | POA: Diagnosis not present

## 2022-08-04 DIAGNOSIS — I2510 Atherosclerotic heart disease of native coronary artery without angina pectoris: Secondary | ICD-10-CM

## 2022-08-04 MED ORDER — RANOLAZINE ER 1000 MG PO TB12: 1000.0000 mg | ORAL_TABLET | Freq: Two times a day (BID) | ORAL | 4 refills | Status: DC

## 2022-08-04 NOTE — Assessment & Plan Note (Addendum)
Patient taking and tolerating Ranexa. Patient reports episode of left flank pain 6 weeks ago while getting out of bed, relieved by laying back down. Likely non-cardiac. Increase ranolazine to 1,000 mg twice daily.

## 2022-08-04 NOTE — Assessment & Plan Note (Signed)
Patient taking and tolerating Entresto. B/p well controlled.

## 2022-08-04 NOTE — Assessment & Plan Note (Signed)
-   Continue amiodarone and Eliquis 

## 2022-08-04 NOTE — Assessment & Plan Note (Signed)
Well controlled on Entresto and metoprolol.

## 2022-08-04 NOTE — Progress Notes (Signed)
Cardiology Office Note   Date:  08/04/2022   ID:  [place], Galey 1950-07-30, MRN [id number]  PCP:  Frazier Richards, MD  Cardiologist:  Neoma Laming, MD    History of Present Illness: Christopher Mendez is a 72 y.o. male who presents for  Chief Complaint  Patient presents with   Follow-up    1 week follow up    Patient in office for one week follow up. Reports chest pain improved since starting ranolazine. Shortness of breath unchanged. Patient reports ongoing leg weakness bilaterally.     Past Medical History:  Diagnosis Date   Cardiac enlargement    COPD (chronic obstructive pulmonary disease) (HCC)    HTN (hypertension)      Past Surgical History:  Procedure Laterality Date   AMPUTATION TOE Right 07/14/2019   Procedure: AMPUTATION TOE RAY RIGHT 5TH PARTIAL;  Surgeon: Caroline More, DPM;  Location: [facility] ORS;  Service: Podiatry;  Laterality: Right;   EYE SURGERY       Current Outpatient Medications  Medication Sig Dispense Refill   albuterol (VENTOLIN HFA) 108 (90 Base) MCG/ACT inhaler Inhale 2 puffs into the lungs every 6 (six) hours as needed for wheezing or shortness of breath.     amiodarone (PACERONE) 200 MG tablet Take 1 tablet (200 mg total) by mouth daily. 30 tablet 1   apixaban (ELIQUIS) 5 MG TABS tablet Take 1 tablet (5 mg total) by mouth 2 (two) times daily. 60 tablet 1   Ascorbic Acid (VITAMIN C) 1000 MG tablet Take 1,000 mg by mouth daily.     aspirin EC 81 MG tablet Take 81 mg by mouth daily. Swallow whole.     cholecalciferol (VITAMIN D3) 25 MCG (1000 UNIT) tablet Take 1,000 Units by mouth daily.     Fluticasone-Salmeterol (ADVAIR) 500-50 MCG/DOSE AEPB Inhale 1 puff into the lungs 2 (two) times daily.     ipratropium-albuterol (DUONEB) 0.5-2.5 (3) MG/3ML SOLN Take 3 mLs by nebulization every 6 (six) hours as needed.     Melatonin 10 MG CAPS Take 10 mg by mouth at bedtime as needed.     metFORMIN (GLUCOPHAGE) 1000 MG tablet Take 1,000 mg by mouth 2  (two) times daily.     metoprolol succinate (TOPROL XL) 25 MG 24 hr tablet Take 1 tablet (25 mg total) by mouth daily. 30 tablet 11   omeprazole (PRILOSEC) 20 MG capsule Take 20 mg by mouth daily.     rosuvastatin (CRESTOR) 40 MG tablet Take 1 tablet (40 mg total) by mouth daily. 30 tablet 4   sacubitril-valsartan (ENTRESTO) 24-26 MG Take 1 tablet by mouth 2 (two) times daily. 60 tablet 4   tiotropium (SPIRIVA) 18 MCG inhalation capsule Place 18 mcg into inhaler and inhale daily.     ranolazine (RANEXA) 1000 MG SR tablet Take 1 tablet (1,000 mg total) by mouth 2 (two) times daily. 60 tablet 4   Current Facility-Administered Medications  Medication Dose Route Frequency Provider Last Rate Last Admin   dipyridamole (PERSANTINE) injection 10 mg  10 mg Intravenous Once Dionisio David, MD        Allergies:   Flomax [tamsulosin hcl] and Myrbetriq [mirabegron]    Social History:   reports that he has been smoking cigarettes. He has a 56.00 pack-year smoking history. He has never used smokeless tobacco. He reports current alcohol use of about 42.0 standard drinks of alcohol per week. He reports that he does not use drugs.   Family  History:  family history includes Diabetes in his father; Hypertension in his mother.    ROS:     Review of Systems  Constitutional: Negative.   HENT: Negative.    Eyes: Negative.   Respiratory: Negative.    Gastrointestinal: Negative.   Genitourinary: Negative.   Musculoskeletal: Negative.   Skin: Negative.   Neurological: Negative.   Endo/Heme/Allergies: Negative.   Psychiatric/Behavioral: Negative.    All other systems reviewed and are negative.   All other systems are reviewed and negative.   PHYSICAL EXAM: VS:  BP 120/70   Pulse 70   Ht 6' (1.829 m)   Wt 205 lb (93 kg)   SpO2 93%   BMI 27.80 kg/m  , BMI Body mass index is 27.8 kg/m. Last weight:  Wt Readings from Last 3 Encounters:  08/04/22 205 lb (93 kg)  07/28/22 206 lb 3.2 oz (93.5 kg)   07/07/22 197 lb (89.4 kg)     Physical Exam Vitals reviewed.  Constitutional:      Appearance: Normal appearance. He is normal weight.  HENT:     Head: Normocephalic.     Nose: Nose normal.     Mouth/Throat:     Mouth: Mucous membranes are moist.  Eyes:     Pupils: Pupils are equal, round, and reactive to light.  Cardiovascular:     Rate and Rhythm: Normal rate and regular rhythm.     Pulses: Normal pulses.     Heart sounds: Normal heart sounds.  Pulmonary:     Effort: Pulmonary effort is normal.  Abdominal:     General: Abdomen is flat. Bowel sounds are normal.  Musculoskeletal:        General: Normal range of motion.     Cervical back: Normal range of motion.  Skin:    General: Skin is warm.  Neurological:     General: No focal deficit present.     Mental Status: He is alert.  Psychiatric:        Mood and Affect: Mood normal.     EKG: none today  Recent Labs: 05/14/2022: ALT 9 05/16/2022: Magnesium 2.4 05/18/2022: Hemoglobin 15.8; Platelets 128 05/19/2022: B Natriuretic Peptide 78.2 05/22/2022: BUN 35; Creatinine, Ser 0.82; Potassium 4.4; Sodium 141    Lipid Panel No results found for: "CHOL", "TRIG", "HDL", "CHOLHDL", "VLDL", "LDLCALC", "LDLDIRECT"    Other studies Reviewed: none today   ASSESSMENT AND PLAN:    ICD-10-CM   1. Acute on chronic diastolic CHF (congestive heart failure) (HCC)  I50.33     2. Other chest pain  R07.89 ranolazine (RANEXA) 1000 MG SR tablet    3. Coronary artery disease involving native coronary artery of native heart without angina pectoris  I25.10     4. Primary hypertension  I10     5. Ischemic cardiomyopathy  I25.5     6. Atrial fibrillation, unspecified type ([place])  I48.91        Problem List Items Addressed This Visit       Cardiovascular and Mediastinum   HTN (hypertension)    Well controlled on Entresto and metoprolol.       Relevant Medications   aspirin EC 81 MG tablet   ranolazine (RANEXA) 1000 MG  SR tablet   Atrial fibrillation (HCC)    Continue amiodarone and Eliquis.       Relevant Medications   aspirin EC 81 MG tablet   ranolazine (RANEXA) 1000 MG SR tablet   Acute on chronic diastolic CHF (congestive heart  failure) ([place]) - Primary   Relevant Medications   aspirin EC 81 MG tablet   ranolazine (RANEXA) 1000 MG SR tablet   Coronary artery disease involving native coronary artery of native heart without angina pectoris    Patient taking and tolerating Ranexa. Patient reports episode of left flank pain 6 weeks ago while getting out of bed, relieved by laying back down. Likely non-cardiac. Increase ranolazine to 1,000 mg twice daily.       Relevant Medications   aspirin EC 81 MG tablet   ranolazine (RANEXA) 1000 MG SR tablet   Ischemic cardiomyopathy    Patient taking and tolerating Entresto. B/p well controlled.       Relevant Medications   aspirin EC 81 MG tablet   ranolazine (RANEXA) 1000 MG SR tablet     Other   Other chest pain   Relevant Medications   ranolazine (RANEXA) 1000 MG SR tablet       Disposition:   Return in about 4 weeks (around 09/01/2022).    Total time spent: 30 minutes  Signed,  Neoma Laming, MD  08/04/2022 9:29 [place]

## 2022-08-04 NOTE — Patient Instructions (Signed)
Increase ranolazine to 1,000 mg twice daily

## 2022-09-01 ENCOUNTER — Ambulatory Visit (INDEPENDENT_AMBULATORY_CARE_PROVIDER_SITE_OTHER): Payer: Medicare Other | Admitting: Cardiovascular Disease

## 2022-09-01 ENCOUNTER — Encounter: Payer: Self-pay | Admitting: Cardiovascular Disease

## 2022-09-01 VITALS — BP 112/70 | HR 48 | Ht 73.0 in | Wt 210.0 lb

## 2022-09-01 DIAGNOSIS — I2510 Atherosclerotic heart disease of native coronary artery without angina pectoris: Secondary | ICD-10-CM

## 2022-09-01 DIAGNOSIS — I480 Paroxysmal atrial fibrillation: Secondary | ICD-10-CM | POA: Diagnosis not present

## 2022-09-01 DIAGNOSIS — I10 Essential (primary) hypertension: Secondary | ICD-10-CM

## 2022-09-01 NOTE — Progress Notes (Signed)
Cardiology Office Note   Date:  09/01/2022   ID:  Christopher Mendez, Christopher Mendez May 13, 1951, MRN [id number]  PCP:  Christopher Sander, MD  Cardiologist:  Adrian Blackwater, MD      History of Present Illness: Christopher Mendez is a 72 y.o. male who presents for  Chief Complaint  Patient presents with   Follow-up    4 week follow up    Patient in office for 4 week follow up. Denies chest pain. Shortness of breath unchanged.     Past Medical History:  Diagnosis Date   Cardiac enlargement    COPD (chronic obstructive pulmonary disease)    HTN (hypertension)      Past Surgical History:  Procedure Laterality Date   AMPUTATION TOE Right 07/14/2019   Procedure: AMPUTATION TOE RAY RIGHT 5TH PARTIAL;  Surgeon: Rosetta Posner, DPM;  Location: [facility] ORS;  Service: Podiatry;  Laterality: Right;   EYE SURGERY       Current Outpatient Medications  Medication Sig Dispense Refill   albuterol (VENTOLIN HFA) 108 (90 Base) MCG/ACT inhaler Inhale 2 puffs into the lungs every 6 (six) hours as needed for wheezing or shortness of breath.     amiodarone (PACERONE) 200 MG tablet Take 1 tablet (200 mg total) by mouth daily. 30 tablet 1   apixaban (ELIQUIS) 5 MG TABS tablet Take 1 tablet (5 mg total) by mouth 2 (two) times daily. 60 tablet 1   Ascorbic Acid (VITAMIN C) 1000 MG tablet Take 1,000 mg by mouth daily.     aspirin EC 81 MG tablet Take 81 mg by mouth daily. Swallow whole.     cholecalciferol (VITAMIN D3) 25 MCG (1000 UNIT) tablet Take 1,000 Units by mouth daily.     Fluticasone-Salmeterol (ADVAIR) 500-50 MCG/DOSE AEPB Inhale 1 puff into the lungs 2 (two) times daily.     ipratropium-albuterol (DUONEB) 0.5-2.5 (3) MG/3ML SOLN Take 3 mLs by nebulization every 6 (six) hours as needed.     Melatonin 10 MG CAPS Take 10 mg by mouth at bedtime as needed.     metFORMIN (GLUCOPHAGE) 1000 MG tablet Take 1,000 mg by mouth 2 (two) times daily.     metoprolol succinate (TOPROL XL) 25 MG 24 hr tablet Take 1 tablet  (25 mg total) by mouth daily. 30 tablet 11   omeprazole (PRILOSEC) 20 MG capsule Take 20 mg by mouth daily.     ranolazine (RANEXA) 1000 MG SR tablet Take 1 tablet (1,000 mg total) by mouth 2 (two) times daily. 60 tablet 4   rosuvastatin (CRESTOR) 40 MG tablet Take 1 tablet (40 mg total) by mouth daily. 30 tablet 4   sacubitril-valsartan (ENTRESTO) 24-26 MG Take 1 tablet by mouth 2 (two) times daily. 60 tablet 4   tiotropium (SPIRIVA) 18 MCG inhalation capsule Place 18 mcg into inhaler and inhale daily.     Current Facility-Administered Medications  Medication Dose Route Frequency Provider Last Rate Last Admin   dipyridamole (PERSANTINE) injection 10 mg  10 mg Intravenous Once Laurier Nancy, MD        Allergies:   Flomax [tamsulosin hcl] and Myrbetriq [mirabegron]    Social History:   reports that he has been smoking cigarettes. He has a 56.00 pack-year smoking history. He has never used smokeless tobacco. He reports current alcohol use of about 42.0 standard drinks of alcohol per week. He reports that he does not use drugs.   Family History:  family history includes Diabetes in his father;  Hypertension in his mother.    ROS:     Review of Systems  Constitutional: Negative.   HENT: Negative.    Eyes: Negative.   Respiratory: Negative.    Cardiovascular: Negative.   Gastrointestinal: Negative.   Genitourinary: Negative.   Musculoskeletal: Negative.   Skin: Negative.   Neurological: Negative.   Endo/Heme/Allergies: Negative.   Psychiatric/Behavioral: Negative.    All other systems reviewed and are negative.   All other systems are reviewed and negative.   PHYSICAL EXAM: VS:  BP 112/70   Pulse (!) 48   Ht 6\' 1"  (1.854 m)   Wt 210 lb (95.3 kg)   SpO2 93%   BMI 27.71 kg/m  , BMI Body mass index is 27.71 kg/m. Last weight:  Wt Readings from Last 3 Encounters:  09/01/22 210 lb (95.3 kg)  08/04/22 205 lb (93 kg)  07/28/22 206 lb 3.2 oz (93.5 kg)    Physical  Exam Vitals reviewed.  Constitutional:      Appearance: Normal appearance. He is normal weight.  HENT:     Head: Normocephalic.     Nose: Nose normal.     Mouth/Throat:     Mouth: Mucous membranes are moist.  Eyes:     Pupils: Pupils are equal, round, and reactive to light.  Cardiovascular:     Rate and Rhythm: Normal rate and regular rhythm.     Pulses: Normal pulses.     Heart sounds: Normal heart sounds.  Pulmonary:     Effort: Pulmonary effort is normal.  Abdominal:     General: Abdomen is flat. Bowel sounds are normal.  Musculoskeletal:        General: Normal range of motion.     Cervical back: Normal range of motion.  Skin:    General: Skin is warm.  Neurological:     General: No focal deficit present.     Mental Status: He is alert.  Psychiatric:        Mood and Affect: Mood normal.     EKG: none today  Recent Labs: 05/14/2022: ALT 9 05/16/2022: Magnesium 2.4 05/18/2022: Hemoglobin 15.8; Platelets 128 05/19/2022: B Natriuretic Peptide 78.2 05/22/2022: BUN 35; Creatinine, Ser 0.82; Potassium 4.4; Sodium 141    Lipid Panel No results found for: "CHOL", "TRIG", "HDL", "CHOLHDL", "VLDL", "LDLCALC", "LDLDIRECT"    Other studies Reviewed: none today    ASSESSMENT AND PLAN:    ICD-10-CM   1. Coronary artery disease involving native coronary artery of native heart without angina pectoris  I25.10     2. Paroxysmal atrial fibrillation  I48.0     3. Primary hypertension  I10        Problem List Items Addressed This Visit       Cardiovascular and Mediastinum   HTN (hypertension)    Well controlled. Continue same medications.       Atrial fibrillation   Coronary artery disease involving native coronary artery of native heart without angina pectoris - Primary    Patient reports no further chest pain. Taking and tolerating increased dose of Ranexa.         Disposition:   Return in about 3 months (around 12/01/2022).    Total time spent: 30  minutes  Signed,  Adrian Blackwater, MD  09/01/2022 9:33 AM    [facility]

## 2022-09-01 NOTE — Assessment & Plan Note (Signed)
Well controlled. Continue same medications. 

## 2022-09-01 NOTE — Assessment & Plan Note (Signed)
Patient reports no further chest pain. Taking and tolerating increased dose of Ranexa.

## 2022-11-30 ENCOUNTER — Encounter: Payer: Self-pay | Admitting: Cardiovascular Disease

## 2022-11-30 ENCOUNTER — Ambulatory Visit: Payer: Medicare Other | Admitting: Cardiovascular Disease

## 2022-11-30 VITALS — BP 128/60 | HR 48 | Ht 73.0 in | Wt 220.0 lb

## 2022-11-30 DIAGNOSIS — I255 Ischemic cardiomyopathy: Secondary | ICD-10-CM

## 2022-11-30 DIAGNOSIS — I480 Paroxysmal atrial fibrillation: Secondary | ICD-10-CM | POA: Diagnosis not present

## 2022-11-30 DIAGNOSIS — R001 Bradycardia, unspecified: Secondary | ICD-10-CM | POA: Diagnosis not present

## 2022-11-30 DIAGNOSIS — I2510 Atherosclerotic heart disease of native coronary artery without angina pectoris: Secondary | ICD-10-CM | POA: Diagnosis not present

## 2022-11-30 DIAGNOSIS — I10 Essential (primary) hypertension: Secondary | ICD-10-CM

## 2022-11-30 DIAGNOSIS — I5023 Acute on chronic systolic (congestive) heart failure: Secondary | ICD-10-CM

## 2022-11-30 DIAGNOSIS — R0602 Shortness of breath: Secondary | ICD-10-CM

## 2022-11-30 NOTE — Progress Notes (Signed)
Cardiology Office Note   Date:  11/30/2022   ID:  [name], [name] 09-22-1950, MRN [id number]  PCP:  Abram Sander, MD  Cardiologist:  Adrian Blackwater, MD      History of Present Illness: Christopher Mendez is a 72 y.o. male who presents for  Chief Complaint  Patient presents with   Follow-up    3 months follow up    Dizziness This is a chronic problem. The current episode started 1 to 4 weeks ago. The problem occurs intermittently. The problem has been gradually worsening. Associated symptoms include congestion, coughing, nausea, vertigo and vomiting. Pertinent negatives include no chills.      Past Medical History:  Diagnosis Date   Cardiac enlargement    COPD (chronic obstructive pulmonary disease) (HCC)    HTN (hypertension)      Past Surgical History:  Procedure Laterality Date   AMPUTATION TOE Right 07/14/2019   Procedure: AMPUTATION TOE RAY RIGHT 5TH PARTIAL;  Surgeon: Rosetta Posner, DPM;  Location: [facility] ORS;  Service: Podiatry;  Laterality: Right;   EYE SURGERY       Current Outpatient Medications  Medication Sig Dispense Refill   albuterol (VENTOLIN HFA) 108 (90 Base) MCG/ACT inhaler Inhale 2 puffs into the lungs every 6 (six) hours as needed for wheezing or shortness of breath.     amiodarone (PACERONE) 200 MG tablet Take 1 tablet (200 mg total) by mouth daily. 30 tablet 1   apixaban (ELIQUIS) 5 MG TABS tablet Take 1 tablet (5 mg total) by mouth 2 (two) times daily. 60 tablet 1   cholecalciferol (VITAMIN D3) 25 MCG (1000 UNIT) tablet Take 1,000 Units by mouth daily.     Fluticasone-Salmeterol (ADVAIR) 500-50 MCG/DOSE AEPB Inhale 1 puff into the lungs 2 (two) times daily.     furosemide (LASIX) 40 MG tablet Take 40 mg by mouth daily.     ipratropium-albuterol (DUONEB) 0.5-2.5 (3) MG/3ML SOLN Take 3 mLs by nebulization every 6 (six) hours as needed.     Melatonin 10 MG CAPS Take 10 mg by mouth at bedtime as needed.     metFORMIN (GLUCOPHAGE) 1000 MG tablet  Take 1,000 mg by mouth 2 (two) times daily.     omeprazole (PRILOSEC) 20 MG capsule Take 20 mg by mouth daily.     ranolazine (RANEXA) 1000 MG SR tablet Take 1 tablet (1,000 mg total) by mouth 2 (two) times daily. 60 tablet 4   rosuvastatin (CRESTOR) 40 MG tablet Take 1 tablet (40 mg total) by mouth daily. 30 tablet 4   sacubitril-valsartan (ENTRESTO) 24-26 MG Take 1 tablet by mouth 2 (two) times daily. 60 tablet 4   tiotropium (SPIRIVA) 18 MCG inhalation capsule Place 18 mcg into inhaler and inhale daily.     Current Facility-Administered Medications  Medication Dose Route Frequency Provider Last Rate Last Admin   dipyridamole (PERSANTINE) injection 10 mg  10 mg Intravenous Once Laurier Nancy, MD        Allergies:   Flomax [tamsulosin hcl] and Myrbetriq [mirabegron]    Social History:   reports that he has been smoking cigarettes. He has a 56.00 pack-year smoking history. He has never used smokeless tobacco. He reports current alcohol use of about 42.0 standard drinks of alcohol per week. He reports that he does not use drugs.   Family History:  family history includes Diabetes in his father; Hypertension in his mother.    ROS:     Review of Systems  Constitutional: Negative.  Negative for chills.  HENT:  Positive for congestion.   Eyes: Negative.   Respiratory:  Positive for cough.   Gastrointestinal:  Positive for nausea and vomiting.  Genitourinary: Negative.   Musculoskeletal: Negative.   Skin: Negative.   Neurological:  Positive for dizziness and vertigo.  Endo/Heme/Allergies: Negative.   Psychiatric/Behavioral: Negative.    All other systems reviewed and are negative.     All other systems are reviewed and negative.    PHYSICAL EXAM: VS:  BP 128/60   Pulse (!) 48   Ht 6\' 1"  (1.854 m)   Wt 220 lb (99.8 kg)   SpO2 92%   BMI 29.03 kg/m  , BMI Body mass index is 29.03 kg/m. Last weight:  Wt Readings from Last 3 Encounters:  11/30/22 220 lb (99.8 kg)  09/01/22  210 lb (95.3 kg)  08/04/22 205 lb (93 kg)     Physical Exam Vitals reviewed.  Constitutional:      Appearance: Normal appearance. He is normal weight.  HENT:     Head: Normocephalic.     Nose: Nose normal.     Mouth/Throat:     Mouth: Mucous membranes are moist.  Eyes:     Pupils: Pupils are equal, round, and reactive to light.  Cardiovascular:     Rate and Rhythm: Normal rate and regular rhythm.     Pulses: Normal pulses.     Heart sounds: Normal heart sounds.  Pulmonary:     Effort: Pulmonary effort is normal.  Abdominal:     General: Abdomen is flat. Bowel sounds are normal.  Musculoskeletal:        General: Normal range of motion.     Cervical back: Normal range of motion.  Skin:    General: Skin is warm.  Neurological:     General: No focal deficit present.     Mental Status: He is alert.  Psychiatric:        Mood and Affect: Mood normal.       EKG:   Recent Labs: 05/14/2022: ALT 9 05/16/2022: Magnesium 2.4 05/18/2022: Hemoglobin 15.8; Platelets 128 05/19/2022: B Natriuretic Peptide 78.2 05/22/2022: BUN 35; Creatinine, Ser 0.82; Potassium 4.4; Sodium 141    Lipid Panel No results found for: "CHOL", "TRIG", "HDL", "CHOLHDL", "VLDL", "LDLCALC", "LDLDIRECT"    Other studies Reviewed: Additional studies/ records that were reviewed today include:  Review of the above records demonstrates:       No data to display            ASSESSMENT AND PLAN:    ICD-10-CM   1. Sinus bradycardia  R00.1    Patient has been wobbly and dizzy and occasional nausea vomiting.  This can be signs of severe sinus bradycardia with heart rate 48.  Will wean off metoprolol .    2. Paroxysmal atrial fibrillation (HCC)  I48.0    Will decrease metoprolol to 12.5 once a day for 3 days and then stop it as patient has severe sinus bradycardia with heart rate 48.    3. Coronary artery disease involving native coronary artery of native heart without angina pectoris  I25.10      4. Primary hypertension  I10     5. Ischemic cardiomyopathy  I25.5     6. SOB (shortness of breath)  R06.02     7. Last EF  I50.23    Appears appears to be compensated last EF was 25% will need to check it again.  Problem List Items Addressed This Visit       Cardiovascular and Mediastinum   HTN (hypertension)   Relevant Medications   furosemide (LASIX) 40 MG tablet   Atrial fibrillation (HCC)   Relevant Medications   furosemide (LASIX) 40 MG tablet   Coronary artery disease involving native coronary artery of native heart without angina pectoris   Relevant Medications   furosemide (LASIX) 40 MG tablet   Ischemic cardiomyopathy   Relevant Medications   furosemide (LASIX) 40 MG tablet   Other Visit Diagnoses     Sinus bradycardia    -  Primary   Patient has been wobbly and dizzy and occasional nausea vomiting.  This can be signs of severe sinus bradycardia with heart rate 48.  Will wean off metoprolol .   Relevant Medications   furosemide (LASIX) 40 MG tablet   SOB (shortness of breath)       Last EF       Appears appears to be compensated last EF was 25% will need to check it again.   Relevant Medications   furosemide (LASIX) 40 MG tablet          Disposition:   Return in about 1 week (around 12/07/2022).    Total time spent: 30 minutes  Signed,  Adrian Blackwater, MD  11/30/2022 9:22 AM    [facility]

## 2022-12-07 ENCOUNTER — Ambulatory Visit: Payer: Medicare Other | Admitting: Cardiovascular Disease

## 2022-12-07 ENCOUNTER — Encounter: Payer: Self-pay | Admitting: Cardiovascular Disease

## 2022-12-07 VITALS — BP 124/62 | HR 68 | Ht 72.0 in | Wt 218.6 lb

## 2022-12-07 DIAGNOSIS — R001 Bradycardia, unspecified: Secondary | ICD-10-CM | POA: Diagnosis not present

## 2022-12-07 DIAGNOSIS — R0602 Shortness of breath: Secondary | ICD-10-CM | POA: Diagnosis not present

## 2022-12-07 DIAGNOSIS — I2510 Atherosclerotic heart disease of native coronary artery without angina pectoris: Secondary | ICD-10-CM | POA: Diagnosis not present

## 2022-12-07 DIAGNOSIS — I480 Paroxysmal atrial fibrillation: Secondary | ICD-10-CM | POA: Diagnosis not present

## 2022-12-07 DIAGNOSIS — R42 Dizziness and giddiness: Secondary | ICD-10-CM

## 2022-12-07 MED ORDER — FUROSEMIDE 20 MG PO TABS: 20.0000 mg | ORAL_TABLET | Freq: Every day | ORAL | 3 refills | Status: DC

## 2022-12-07 NOTE — Progress Notes (Signed)
Cardiology Office Note   Date:  12/07/2022   ID:  Shavon, Zenz 1950/09/30, MRN [id number]  PCP:  Abram Sander, MD  Cardiologist:  Adrian Blackwater, MD      History of Present Illness: Christopher Mendez is a 72 y.o. male who presents for  Chief Complaint  Patient presents with   Follow-up    1 week follow     SOB, wobly  Shortness of Breath This is a chronic problem. The current episode started more than 1 year ago. The problem has been waxing and waning.      Past Medical History:  Diagnosis Date   Cardiac enlargement    COPD (chronic obstructive pulmonary disease) (HCC)    HTN (hypertension)      Past Surgical History:  Procedure Laterality Date   AMPUTATION TOE Right 07/14/2019   Procedure: AMPUTATION TOE RAY RIGHT 5TH PARTIAL;  Surgeon: Rosetta Posner, DPM;  Location: [facility] ORS;  Service: Podiatry;  Laterality: Right;   EYE SURGERY       Current Outpatient Medications  Medication Sig Dispense Refill   furosemide (LASIX) 20 MG tablet Take 1 tablet (20 mg total) by mouth daily. 30 tablet 3   albuterol (VENTOLIN HFA) 108 (90 Base) MCG/ACT inhaler Inhale 2 puffs into the lungs every 6 (six) hours as needed for wheezing or shortness of breath.     amiodarone (PACERONE) 200 MG tablet Take 1 tablet (200 mg total) by mouth daily. 30 tablet 1   apixaban (ELIQUIS) 5 MG TABS tablet Take 1 tablet (5 mg total) by mouth 2 (two) times daily. 60 tablet 1   cholecalciferol (VITAMIN D3) 25 MCG (1000 UNIT) tablet Take 1,000 Units by mouth daily.     Fluticasone-Salmeterol (ADVAIR) 500-50 MCG/DOSE AEPB Inhale 1 puff into the lungs 2 (two) times daily.     ipratropium-albuterol (DUONEB) 0.5-2.5 (3) MG/3ML SOLN Take 3 mLs by nebulization every 6 (six) hours as needed.     Melatonin 10 MG CAPS Take 10 mg by mouth at bedtime as needed.     metFORMIN (GLUCOPHAGE) 1000 MG tablet Take 1,000 mg by mouth 2 (two) times daily.     omeprazole (PRILOSEC) 20 MG capsule Take 20 mg by  mouth daily.     ranolazine (RANEXA) 1000 MG SR tablet Take 1 tablet (1,000 mg total) by mouth 2 (two) times daily. 60 tablet 4   rosuvastatin (CRESTOR) 40 MG tablet Take 1 tablet (40 mg total) by mouth daily. 30 tablet 4   sacubitril-valsartan (ENTRESTO) 24-26 MG Take 1 tablet by mouth 2 (two) times daily. 60 tablet 4   tiotropium (SPIRIVA) 18 MCG inhalation capsule Place 18 mcg into inhaler and inhale daily.     Current Facility-Administered Medications  Medication Dose Route Frequency Provider Last Rate Last Admin   dipyridamole (PERSANTINE) injection 10 mg  10 mg Intravenous Once Laurier Nancy, MD        Allergies:   Flomax [tamsulosin hcl] and Myrbetriq [mirabegron]    Social History:   reports that he has been smoking cigarettes. He has a 56 pack-year smoking history. He has never used smokeless tobacco. He reports current alcohol use of about 42.0 standard drinks of alcohol per week. He reports that he does not use drugs.   Family History:  family history includes Diabetes in his father; Hypertension in his mother.    ROS:     Review of Systems  Constitutional: Negative.   HENT: Negative.  Eyes: Negative.   Respiratory:  Positive for shortness of breath.   Gastrointestinal: Negative.   Genitourinary: Negative.   Musculoskeletal: Negative.   Skin: Negative.   Neurological: Negative.   Endo/Heme/Allergies: Negative.   Psychiatric/Behavioral: Negative.    All other systems reviewed and are negative.     All other systems are reviewed and negative.    PHYSICAL EXAM: VS:  BP 124/62   Pulse 68   Ht 6' (1.829 m)   Wt 218 lb 9.6 oz (99.2 kg)   SpO2 90% Comment: with Oxygen  BMI 29.65 kg/m  , BMI Body mass index is 29.65 kg/m. Last weight:  Wt Readings from Last 3 Encounters:  12/07/22 218 lb 9.6 oz (99.2 kg)  11/30/22 220 lb (99.8 kg)  09/01/22 210 lb (95.3 kg)     Physical Exam Vitals reviewed.  Constitutional:      Appearance: Normal appearance. He is  normal weight.  HENT:     Head: Normocephalic.     Nose: Nose normal.     Mouth/Throat:     Mouth: Mucous membranes are moist.  Eyes:     Pupils: Pupils are equal, round, and reactive to light.  Cardiovascular:     Rate and Rhythm: Normal rate and regular rhythm.     Pulses: Normal pulses.     Heart sounds: Normal heart sounds.  Pulmonary:     Effort: Pulmonary effort is normal.  Abdominal:     General: Abdomen is flat. Bowel sounds are normal.  Musculoskeletal:        General: Normal range of motion.     Cervical back: Normal range of motion.  Skin:    General: Skin is warm.  Neurological:     General: No focal deficit present.     Mental Status: He is alert.  Psychiatric:        Mood and Affect: Mood normal.       EKG:   Recent Labs: 05/14/2022: ALT 9 05/16/2022: Magnesium 2.4 05/18/2022: Hemoglobin 15.8; Platelets 128 05/19/2022: B Natriuretic Peptide 78.2 05/22/2022: BUN 35; Creatinine, Ser 0.82; Potassium 4.4; Sodium 141    Lipid Panel No results found for: "CHOL", "TRIG", "HDL", "CHOLHDL", "VLDL", "LDLCALC", "LDLDIRECT"    Other studies Reviewed: Additional studies/ records that were reviewed today include:  Review of the above records demonstrates:       No data to display            ASSESSMENT AND PLAN:    ICD-10-CM   1. Paroxysmal atrial fibrillation (HCC)  I48.0 furosemide (LASIX) 20 MG tablet    2. Coronary artery disease involving native coronary artery of native heart without angina pectoris  I25.10 furosemide (LASIX) 20 MG tablet    3. Sinus bradycardia  R00.1 furosemide (LASIX) 20 MG tablet   Heart rate 68 after weaning off metoprolol, but BP 110/40, so will change lasix to 20 mg daily    4. SOB (shortness of breath)  R06.02 furosemide (LASIX) 20 MG tablet    5. Dizziness  R42 furosemide (LASIX) 20 MG tablet       Problem List Items Addressed This Visit       Cardiovascular and Mediastinum   Atrial fibrillation (HCC) -  Primary   Relevant Medications   furosemide (LASIX) 20 MG tablet   Coronary artery disease involving native coronary artery of native heart without angina pectoris   Relevant Medications   furosemide (LASIX) 20 MG tablet   Other Visit Diagnoses  Sinus bradycardia       Heart rate 68 after weaning off metoprolol, but BP 110/40, so will change lasix to 20 mg daily   Relevant Medications   furosemide (LASIX) 20 MG tablet   SOB (shortness of breath)       Relevant Medications   furosemide (LASIX) 20 MG tablet   Dizziness       Relevant Medications   furosemide (LASIX) 20 MG tablet          Disposition:   Return in about 4 weeks (around 01/04/2023).    Total time spent: 30 minutes  Signed,  Adrian Blackwater, MD  12/07/2022 1:51 PM    [facility]

## 2022-12-21 ENCOUNTER — Other Ambulatory Visit: Payer: Self-pay | Admitting: Cardiovascular Disease

## 2022-12-30 ENCOUNTER — Ambulatory Visit (INDEPENDENT_AMBULATORY_CARE_PROVIDER_SITE_OTHER): Payer: Medicare Other | Admitting: Urology

## 2022-12-30 VITALS — BP 126/70 | HR 52 | Ht 72.0 in | Wt 218.6 lb

## 2022-12-30 DIAGNOSIS — N401 Enlarged prostate with lower urinary tract symptoms: Secondary | ICD-10-CM | POA: Diagnosis not present

## 2022-12-30 DIAGNOSIS — N138 Other obstructive and reflux uropathy: Secondary | ICD-10-CM

## 2022-12-30 DIAGNOSIS — Z8546 Personal history of malignant neoplasm of prostate: Secondary | ICD-10-CM | POA: Diagnosis not present

## 2022-12-30 LAB — BLADDER SCAN AMB NON-IMAGING: Scan Result: 78

## 2022-12-30 NOTE — Progress Notes (Signed)
I,Dina M Abdulla,acting as a scribe for Christopher Scotland, MD.,have documented all relevant documentation on the behalf of Christopher Scotland, MD,as directed by  Christopher Scotland, MD while in the presence of Christopher Scotland, MD.   12/30/2022 8:05 PM   Roger Shelter Karn Cassis Sep 24, 1950 [id number]  Referring provider: Abram Sander, MD [address] [place],  [place] [address]  Chief Complaint  Patient presents with   Follow-up    HPI: 72 yearold male who presents today for routine annual follow up. He has a personal history of prostate cancer and urinary frequency.  He was started on degerelix and underwent multiple failed voiding trials.  Ultimately, he started on CIC and underwent prostate biopsy on 09/23/2017.   This revealed a 12 of 12 cores positive of high risk disease, Gleason 4+5 and 4+4 in all cores involving up to 100% of the tissue.   He finished 3 year course of ADT his last injection was on 10/30/2020.    His most recent PSA was 0.71 in August 2023.  He is having some urgency/frequency, which he relates to his Lasix. Otherwise he has no urinary complaints.   PMH: Past Medical History:  Diagnosis Date   Cardiac enlargement    COPD (chronic obstructive pulmonary disease) (HCC)    HTN (hypertension)     Surgical History: Past Surgical History:  Procedure Laterality Date   AMPUTATION TOE Right 07/14/2019   Procedure: AMPUTATION TOE RAY RIGHT 5TH PARTIAL;  Surgeon: Rosetta Posner, DPM;  Location: [facility] ORS;  Service: Podiatry;  Laterality: Right;   EYE SURGERY      Home Medications:  Allergies as of 12/30/2022       Reactions   Flomax [tamsulosin Hcl] Rash   Myrbetriq [mirabegron] Rash        Medication List        Accurate as of December 30, 2022  8:05 PM. If you have any questions, ask your nurse or doctor.          albuterol 108 (90 Base) MCG/ACT inhaler Commonly known as: VENTOLIN HFA Inhale 2 puffs into the lungs every 6 (six) hours as needed for wheezing or  shortness of breath.   amiodarone 200 MG tablet Commonly known as: PACERONE Take 1 tablet (200 mg total) by mouth daily.   apixaban 5 MG Tabs tablet Commonly known as: ELIQUIS Take 1 tablet (5 mg total) by mouth 2 (two) times daily.   cholecalciferol 25 MCG (1000 UNIT) tablet Commonly known as: VITAMIN D3 Take 1,000 Units by mouth daily.   Entresto 24-26 MG Generic drug: sacubitril-valsartan Take 1 tablet by mouth 2 (two) times daily.   FeroSul 325 (65 Fe) MG tablet Generic drug: ferrous sulfate Take by mouth.   Fluticasone-Salmeterol 500-50 MCG/DOSE Aepb Commonly known as: ADVAIR Inhale 1 puff into the lungs 2 (two) times daily.   furosemide 20 MG tablet Commonly known as: Lasix Take 1 tablet (20 mg total) by mouth daily.   ipratropium-albuterol 0.5-2.5 (3) MG/3ML Soln Commonly known as: DUONEB Take 3 mLs by nebulization every 6 (six) hours as needed.   Melatonin 10 MG Caps Take 10 mg by mouth at bedtime as needed.   metFORMIN 1000 MG tablet Commonly known as: GLUCOPHAGE Take 1,000 mg by mouth 2 (two) times daily.   omeprazole 20 MG capsule Commonly known as: PRILOSEC Take 20 mg by mouth daily.   ranolazine 1000 MG SR tablet Commonly known as: Ranexa Take 1 tablet (1,000 mg total) by mouth 2 (two) times  daily.   rosuvastatin 40 MG tablet Commonly known as: CRESTOR Take 1 tablet (40 mg total) by mouth daily.   tiotropium 18 MCG inhalation capsule Commonly known as: SPIRIVA Place 18 mcg into inhaler and inhale daily.        Allergies:  Allergies  Allergen Reactions   Flomax [Tamsulosin Hcl] Rash   Myrbetriq [Mirabegron] Rash    Family History: Family History  Problem Relation Age of Onset   Hypertension Mother    Diabetes Father     Social History:  reports that he has been smoking cigarettes. He has a 56 pack-year smoking history. He has never used smokeless tobacco. He reports current alcohol use of about 42.0 standard drinks of alcohol per  week. He reports that he does not use drugs.   Physical Exam: BP 126/70   Pulse (!) 52   Ht 6' (1.829 m)   Wt 218 lb 9.6 oz (99.2 kg)   BMI 29.65 kg/m   Constitutional:  Alert and oriented, No acute distress. HEENT: [place] AT, moist mucus membranes.  Trachea midline, no masses. Neurologic: Grossly intact, no focal deficits, moving all 4 extremities. Psychiatric: Normal mood and affect.   Assessment & Plan:    1. History of prostate cancer -No further ADT. -PSA remains low as of 10 months ago. Repeated today. We would recommend getting a PSA on a 6 month basis.  2. BPH with urinary obstruction -He is emptying his bladder reasonably well.  Return in about 1 year (around 12/30/2023).with PSA  I have reviewed the above documentation for accuracy and completeness, and I agree with the above.   Christopher Scotland, MD   [facility] [address], [address] [place], [place] [address] [phone]

## 2023-01-07 ENCOUNTER — Encounter: Payer: Self-pay | Admitting: Cardiovascular Disease

## 2023-01-07 ENCOUNTER — Ambulatory Visit: Payer: Medicare Other | Admitting: Cardiovascular Disease

## 2023-01-07 VITALS — BP 115/65 | HR 69 | Ht 72.0 in | Wt 216.0 lb

## 2023-01-07 DIAGNOSIS — I5033 Acute on chronic diastolic (congestive) heart failure: Secondary | ICD-10-CM | POA: Diagnosis not present

## 2023-01-07 DIAGNOSIS — I480 Paroxysmal atrial fibrillation: Secondary | ICD-10-CM

## 2023-01-07 DIAGNOSIS — I255 Ischemic cardiomyopathy: Secondary | ICD-10-CM

## 2023-01-07 DIAGNOSIS — I10 Essential (primary) hypertension: Secondary | ICD-10-CM

## 2023-01-07 DIAGNOSIS — I2510 Atherosclerotic heart disease of native coronary artery without angina pectoris: Secondary | ICD-10-CM

## 2023-01-07 MED ORDER — FUROSEMIDE 20 MG PO TABS: 20.0000 mg | ORAL_TABLET | Freq: Two times a day (BID) | ORAL | 11 refills | Status: DC

## 2023-01-07 NOTE — Progress Notes (Signed)
Cardiology Office Note   Date:  01/07/2023   ID:  Christopher Mendez, Christopher Mendez 1950-07-07, MRN [id number]  PCP:  Christopher Sander, MD  Cardiologist:  Christopher Blackwater, MD      History of Present Illness: Christopher Mendez is a 72 y.o. male who presents for  Chief Complaint  Patient presents with   Follow-up    1 mo f/u    Patient states he gets short of breath a lot and even on oxygen was feeling like he is out of it.  Shortness of Breath This is a chronic problem. The current episode started more than 1 year ago. The problem has been waxing and waning.      Past Medical History:  Diagnosis Date   Cardiac enlargement    COPD (chronic obstructive pulmonary disease) (HCC)    HTN (hypertension)      Past Surgical History:  Procedure Laterality Date   AMPUTATION TOE Right 07/14/2019   Procedure: AMPUTATION TOE RAY RIGHT 5TH PARTIAL;  Surgeon: Rosetta Posner, DPM;  Location: [facility] ORS;  Service: Podiatry;  Laterality: Right;   EYE SURGERY       Current Outpatient Medications  Medication Sig Dispense Refill   furosemide (LASIX) 20 MG tablet Take 1 tablet (20 mg total) by mouth 2 (two) times daily. 60 tablet 11   albuterol (VENTOLIN HFA) 108 (90 Base) MCG/ACT inhaler Inhale 2 puffs into the lungs every 6 (six) hours as needed for wheezing or shortness of breath.     amiodarone (PACERONE) 200 MG tablet Take 1 tablet (200 mg total) by mouth daily. 30 tablet 1   apixaban (ELIQUIS) 5 MG TABS tablet Take 1 tablet (5 mg total) by mouth 2 (two) times daily. 60 tablet 1   cholecalciferol (VITAMIN D3) 25 MCG (1000 UNIT) tablet Take 1,000 Units by mouth daily.     FEROSUL 325 (65 Fe) MG tablet Take by mouth.     Fluticasone-Salmeterol (ADVAIR) 500-50 MCG/DOSE AEPB Inhale 1 puff into the lungs 2 (two) times daily.     ipratropium-albuterol (DUONEB) 0.5-2.5 (3) MG/3ML SOLN Take 3 mLs by nebulization every 6 (six) hours as needed.     Melatonin 10 MG CAPS Take 10 mg by mouth at bedtime as needed.      metFORMIN (GLUCOPHAGE) 1000 MG tablet Take 1,000 mg by mouth 2 (two) times daily.     omeprazole (PRILOSEC) 20 MG capsule Take 20 mg by mouth daily.     ranolazine (RANEXA) 1000 MG SR tablet Take 1 tablet (1,000 mg total) by mouth 2 (two) times daily. 60 tablet 4   rosuvastatin (CRESTOR) 40 MG tablet Take 1 tablet (40 mg total) by mouth daily. 30 tablet 3   sacubitril-valsartan (ENTRESTO) 24-26 MG Take 1 tablet by mouth 2 (two) times daily. 60 tablet 4   tiotropium (SPIRIVA) 18 MCG inhalation capsule Place 18 mcg into inhaler and inhale daily.     Current Facility-Administered Medications  Medication Dose Route Frequency Provider Last Rate Last Admin   dipyridamole (PERSANTINE) injection 10 mg  10 mg Intravenous Once Laurier Nancy, MD        Allergies:   Flomax [tamsulosin hcl] and Myrbetriq [mirabegron]    Social History:   reports that he has been smoking cigarettes. He has a 56 pack-year smoking history. He has never used smokeless tobacco. He reports current alcohol use of about 42.0 standard drinks of alcohol per week. He reports that he does not use drugs.  Family History:  family history includes Diabetes in his father; Hypertension in his mother.    ROS:     Review of Systems  Constitutional: Negative.   HENT: Negative.    Eyes: Negative.   Respiratory:  Positive for shortness of breath.   Gastrointestinal: Negative.   Genitourinary: Negative.   Musculoskeletal: Negative.   Skin: Negative.   Neurological: Negative.   Endo/Heme/Allergies: Negative.   Psychiatric/Behavioral: Negative.    All other systems reviewed and are negative.     All other systems are reviewed and negative.    PHYSICAL EXAM: VS:  BP 115/65   Pulse 69   Ht 6' (1.829 m)   Wt 216 lb (98 kg)   SpO2 90%   BMI 29.29 kg/m  , BMI Body mass index is 29.29 kg/m. Last weight:  Wt Readings from Last 3 Encounters:  01/07/23 216 lb (98 kg)  12/30/22 218 lb 9.6 oz (99.2 kg)  12/07/22 218 lb  9.6 oz (99.2 kg)     Physical Exam Vitals reviewed.  Constitutional:      Appearance: Normal appearance. He is normal weight.  HENT:     Head: Normocephalic.     Nose: Nose normal.     Mouth/Throat:     Mouth: Mucous membranes are moist.  Eyes:     Pupils: Pupils are equal, round, and reactive to light.  Cardiovascular:     Rate and Rhythm: Normal rate and regular rhythm.     Pulses: Normal pulses.     Heart sounds: Normal heart sounds.  Pulmonary:     Effort: Pulmonary effort is normal.  Abdominal:     General: Abdomen is flat. Bowel sounds are normal.  Musculoskeletal:        General: Normal range of motion.     Cervical back: Normal range of motion.  Skin:    General: Skin is warm.  Neurological:     General: No focal deficit present.     Mental Status: He is alert.  Psychiatric:        Mood and Affect: Mood normal.       EKG:   Recent Labs: 05/14/2022: ALT 9 05/16/2022: Magnesium 2.4 05/18/2022: Hemoglobin 15.8; Platelets 128 05/19/2022: B Natriuretic Peptide 78.2 05/22/2022: BUN 35; Creatinine, Ser 0.82; Potassium 4.4; Sodium 141    Lipid Panel No results found for: "CHOL", "TRIG", "HDL", "CHOLHDL", "VLDL", "LDLCALC", "LDLDIRECT"    Other studies Reviewed: Additional studies/ records that were reviewed today include:  Review of the above records demonstrates:       No data to display            ASSESSMENT AND PLAN:    ICD-10-CM   1. Acute on chronic diastolic CHF (congestive heart failure) (HCC)  I50.33 furosemide (LASIX) 20 MG tablet   Patient is still very short of breath with crepitation at the lung bases advise Lasix 20 mg twice a day.  Continue rest of the medication.    2. Primary hypertension  I10 furosemide (LASIX) 20 MG tablet    3. Ischemic cardiomyopathy  I25.5 furosemide (LASIX) 20 MG tablet    4. Paroxysmal atrial fibrillation (HCC)  I48.0     5. Coronary artery disease involving native coronary artery of native heart  without angina pectoris  I25.10        Problem List Items Addressed This Visit       Cardiovascular and Mediastinum   HTN (hypertension)   Relevant Medications   furosemide (LASIX) 20  MG tablet   Atrial fibrillation (HCC)   Relevant Medications   furosemide (LASIX) 20 MG tablet   Acute on chronic diastolic CHF (congestive heart failure) (HCC) - Primary   Relevant Medications   furosemide (LASIX) 20 MG tablet   Coronary artery disease involving native coronary artery of native heart without angina pectoris   Relevant Medications   furosemide (LASIX) 20 MG tablet   Ischemic cardiomyopathy   Relevant Medications   furosemide (LASIX) 20 MG tablet       Disposition:   Return in about 2 weeks (around 01/21/2023).    Total time spent: 30 minutes  Signed,  Christopher Blackwater, MD  01/07/2023 10:17 AM    [facility]

## 2023-01-11 ENCOUNTER — Other Ambulatory Visit: Payer: Self-pay | Admitting: Cardiovascular Disease

## 2023-01-11 DIAGNOSIS — R0789 Other chest pain: Secondary | ICD-10-CM

## 2023-01-21 ENCOUNTER — Ambulatory Visit: Payer: Medicare Other | Admitting: Cardiovascular Disease

## 2023-01-21 ENCOUNTER — Encounter: Payer: Self-pay | Admitting: Cardiovascular Disease

## 2023-01-21 VITALS — BP 138/42 | HR 62 | Ht 73.0 in | Wt 214.0 lb

## 2023-01-21 DIAGNOSIS — I10 Essential (primary) hypertension: Secondary | ICD-10-CM | POA: Diagnosis not present

## 2023-01-21 DIAGNOSIS — J9602 Acute respiratory failure with hypercapnia: Secondary | ICD-10-CM

## 2023-01-21 DIAGNOSIS — I480 Paroxysmal atrial fibrillation: Secondary | ICD-10-CM | POA: Diagnosis not present

## 2023-01-21 DIAGNOSIS — I2510 Atherosclerotic heart disease of native coronary artery without angina pectoris: Secondary | ICD-10-CM | POA: Diagnosis not present

## 2023-01-21 DIAGNOSIS — I5033 Acute on chronic diastolic (congestive) heart failure: Secondary | ICD-10-CM

## 2023-01-21 DIAGNOSIS — J9601 Acute respiratory failure with hypoxia: Secondary | ICD-10-CM

## 2023-01-21 NOTE — Progress Notes (Signed)
Cardiology Office Note   Date:  01/21/2023   ID:  Christopher, Mendez [birth year], MRN [id number]  PCP:  Abram Sander, MD  Cardiologist:  Adrian Blackwater, MD      History of Present Illness: Christopher Mendez is a 72 y.o. male who presents for No chief complaint on file.   Shortness of Breath This is a chronic problem. The current episode started more than 1 month ago. The problem has been gradually improving.      Past Medical History:  Diagnosis Date   Cardiac enlargement    COPD (chronic obstructive pulmonary disease) (HCC)    HTN (hypertension)      Past Surgical History:  Procedure Laterality Date   AMPUTATION TOE Right 07/14/2019   Procedure: AMPUTATION TOE RAY RIGHT 5TH PARTIAL;  Surgeon: Rosetta Posner, DPM;  Location: [facility] ORS;  Service: Podiatry;  Laterality: Right;   EYE SURGERY       Current Outpatient Medications  Medication Sig Dispense Refill   albuterol (VENTOLIN HFA) 108 (90 Base) MCG/ACT inhaler Inhale 2 puffs into the lungs every 6 (six) hours as needed for wheezing or shortness of breath.     amiodarone (PACERONE) 200 MG tablet Take 1 tablet (200 mg total) by mouth daily. 30 tablet 1   apixaban (ELIQUIS) 5 MG TABS tablet Take 1 tablet (5 mg total) by mouth 2 (two) times daily. 60 tablet 1   cholecalciferol (VITAMIN D3) 25 MCG (1000 UNIT) tablet Take 1,000 Units by mouth daily.     FEROSUL 325 (65 Fe) MG tablet Take by mouth.     Fluticasone-Salmeterol (ADVAIR) 500-50 MCG/DOSE AEPB Inhale 1 puff into the lungs 2 (two) times daily.     furosemide (LASIX) 20 MG tablet Take 1 tablet (20 mg total) by mouth 2 (two) times daily. 60 tablet 11   ipratropium-albuterol (DUONEB) 0.5-2.5 (3) MG/3ML SOLN Take 3 mLs by nebulization every 6 (six) hours as needed.     Melatonin 10 MG CAPS Take 10 mg by mouth at bedtime as needed.     metFORMIN (GLUCOPHAGE) 1000 MG tablet Take 1,000 mg by mouth 2 (two) times daily.     omeprazole (PRILOSEC) 20 MG capsule Take 20 mg  by mouth daily.     ranolazine (RANEXA) 1000 MG SR tablet Take 1 tablet (1,000 mg total) by mouth 2 (two) times daily. 60 tablet 3   rosuvastatin (CRESTOR) 40 MG tablet Take 1 tablet (40 mg total) by mouth daily. 30 tablet 3   sacubitril-valsartan (ENTRESTO) 24-26 MG Take 1 tablet by mouth 2 (two) times daily. 60 tablet 4   tiotropium (SPIRIVA) 18 MCG inhalation capsule Place 18 mcg into inhaler and inhale daily.     Current Facility-Administered Medications  Medication Dose Route Frequency Provider Last Rate Last Admin   dipyridamole (PERSANTINE) injection 10 mg  10 mg Intravenous Once Laurier Nancy, MD        Allergies:   Flomax [tamsulosin hcl] and Myrbetriq [mirabegron]    Social History:   reports that he has been smoking cigarettes. He has a 56 pack-year smoking history. He has never used smokeless tobacco. He reports current alcohol use of about 42.0 standard drinks of alcohol per week. He reports that he does not use drugs.   Family History:  family history includes Diabetes in his father; Hypertension in his mother.    ROS:     Review of Systems  Constitutional: Negative.   HENT: Negative.  Eyes: Negative.   Respiratory:  Positive for shortness of breath.   Gastrointestinal: Negative.   Genitourinary: Negative.   Musculoskeletal: Negative.   Skin: Negative.   Neurological: Negative.   Endo/Heme/Allergies: Negative.   Psychiatric/Behavioral: Negative.    All other systems reviewed and are negative.     All other systems are reviewed and negative.    PHYSICAL EXAM: VS:  BP (!) 138/42   Pulse 62   Ht 6\' 1"  (1.854 m)   Wt 214 lb (97.1 kg)   SpO2 91%   BMI 28.23 kg/m  , BMI Body mass index is 28.23 kg/m. Last weight:  Wt Readings from Last 3 Encounters:  01/21/23 214 lb (97.1 kg)  01/07/23 216 lb (98 kg)  12/30/22 218 lb 9.6 oz (99.2 kg)     Physical Exam Vitals reviewed.  Constitutional:      Appearance: Normal appearance. He is normal weight.   HENT:     Head: Normocephalic.     Nose: Nose normal.     Mouth/Throat:     Mouth: Mucous membranes are moist.  Eyes:     Pupils: Pupils are equal, round, and reactive to light.  Cardiovascular:     Rate and Rhythm: Normal rate and regular rhythm.     Pulses: Normal pulses.     Heart sounds: Normal heart sounds.  Pulmonary:     Effort: Pulmonary effort is normal.  Abdominal:     General: Abdomen is flat. Bowel sounds are normal.  Musculoskeletal:        General: Normal range of motion.     Cervical back: Normal range of motion.  Skin:    General: Skin is warm.  Neurological:     General: No focal deficit present.     Mental Status: He is alert.  Psychiatric:        Mood and Affect: Mood normal.       EKG:   Recent Labs: 05/14/2022: ALT 9 05/16/2022: Magnesium 2.4 05/18/2022: Hemoglobin 15.8; Platelets 128 05/19/2022: B Natriuretic Peptide 78.2 05/22/2022: BUN 35; Creatinine, Ser 0.82; Potassium 4.4; Sodium 141    Lipid Panel No results found for: "CHOL", "TRIG", "HDL", "CHOLHDL", "VLDL", "LDLCALC", "LDLDIRECT"    Other studies Reviewed: Additional studies/ records that were reviewed today include:  Review of the above records demonstrates:       No data to display            ASSESSMENT AND PLAN:    ICD-10-CM   1. Acute on chronic diastolic CHF (congestive heart failure) (HCC)  I50.33    Gradually getting better, lasix 20 helping as lungs clear.    2. Paroxysmal atrial fibrillation (HCC)  I48.0     3. Coronary artery disease involving native coronary artery of native heart without angina pectoris  I25.10     4. Primary hypertension  I10     5. Acute respiratory failure with hypoxia and hypercapnia (HCC)  J96.01    J96.02        Problem List Items Addressed This Visit       Cardiovascular and Mediastinum   HTN (hypertension)   Atrial fibrillation (HCC)   Acute on chronic diastolic CHF (congestive heart failure) (HCC) - Primary   Coronary  artery disease involving native coronary artery of native heart without angina pectoris     Respiratory   Acute respiratory failure with hypoxia and hypercapnia (HCC)       Disposition:   Return in about 2 months (around  03/23/2023).    Total time spent: 35 minutes  Signed,  Adrian Blackwater, MD  01/21/2023 9:28 AM    [facility]

## 2023-03-19 ENCOUNTER — Telehealth: Payer: Self-pay

## 2023-03-19 NOTE — Telephone Encounter (Signed)
Please call patient to come sign paperwork for patient assistance and bring financial papers to get these sent off right away

## 2023-03-23 ENCOUNTER — Ambulatory Visit: Payer: Medicare Other | Admitting: Cardiovascular Disease

## 2023-03-29 ENCOUNTER — Telehealth: Payer: Self-pay | Admitting: Cardiovascular Disease

## 2023-03-29 NOTE — Telephone Encounter (Signed)
Pt is on hospice care Wife called stating that she needed to cancel his appt, as he is not able to travel or move a lot at this time. States that the hospice nurses said that if he needs any blood or urine tests done, they would be glad to do it and bring it up here.. Thank you

## 2023-03-30 ENCOUNTER — Ambulatory Visit: Payer: Medicare Other | Admitting: Cardiovascular Disease

## 2023-04-25 DEATH — deceased

## 2023-12-16 ENCOUNTER — Other Ambulatory Visit: Payer: Self-pay

## 2023-12-22 ENCOUNTER — Ambulatory Visit: Payer: Self-pay | Admitting: Urology
